# Patient Record
Sex: Female | Born: 1985 | Race: Black or African American | Hispanic: No | Marital: Single | Smoking: Never smoker
Health system: Southern US, Community
[De-identification: ages and names within clinical notes are randomized; demographics above are authoritative.]

## PROBLEM LIST (undated history)

## (undated) DIAGNOSIS — N281 Cyst of kidney, acquired: Secondary | ICD-10-CM

## (undated) DIAGNOSIS — Z807 Family history of other malignant neoplasms of lymphoid, hematopoietic and related tissues: Secondary | ICD-10-CM

## (undated) DIAGNOSIS — Z800 Family history of malignant neoplasm of digestive organs: Secondary | ICD-10-CM

## (undated) DIAGNOSIS — R7689 Other specified abnormal immunological findings in serum: Secondary | ICD-10-CM

## (undated) DIAGNOSIS — Z803 Family history of malignant neoplasm of breast: Secondary | ICD-10-CM

## (undated) DIAGNOSIS — R51 Headache: Secondary | ICD-10-CM

## (undated) DIAGNOSIS — Z8042 Family history of malignant neoplasm of prostate: Secondary | ICD-10-CM

## (undated) DIAGNOSIS — Q6689 Other  specified congenital deformities of feet: Secondary | ICD-10-CM

## (undated) DIAGNOSIS — K449 Diaphragmatic hernia without obstruction or gangrene: Secondary | ICD-10-CM

## (undated) DIAGNOSIS — L309 Dermatitis, unspecified: Secondary | ICD-10-CM

## (undated) DIAGNOSIS — R768 Other specified abnormal immunological findings in serum: Secondary | ICD-10-CM

## (undated) DIAGNOSIS — R7989 Other specified abnormal findings of blood chemistry: Secondary | ICD-10-CM

## (undated) DIAGNOSIS — J039 Acute tonsillitis, unspecified: Secondary | ICD-10-CM

## (undated) DIAGNOSIS — K2970 Gastritis, unspecified, without bleeding: Secondary | ICD-10-CM

## (undated) DIAGNOSIS — K760 Fatty (change of) liver, not elsewhere classified: Secondary | ICD-10-CM

## (undated) HISTORY — PX: CHOLECYSTECTOMY: SHX55

## (undated) HISTORY — DX: Other specified abnormal findings of blood chemistry: R79.89

## (undated) HISTORY — DX: Gastritis, unspecified, without bleeding: K29.70

## (undated) HISTORY — PX: TONSILLECTOMY: SUR1361

## (undated) HISTORY — DX: Cyst of kidney, acquired: N28.1

## (undated) HISTORY — DX: Family history of malignant neoplasm of breast: Z80.3

## (undated) HISTORY — DX: Other specified abnormal immunological findings in serum: R76.89

## (undated) HISTORY — DX: Other specified congenital deformities of feet: Q66.89

## (undated) HISTORY — DX: Dermatitis, unspecified: L30.9

## (undated) HISTORY — DX: Family history of malignant neoplasm of prostate: Z80.42

## (undated) HISTORY — DX: Family history of other malignant neoplasms of lymphoid, hematopoietic and related tissues: Z80.7

## (undated) HISTORY — DX: Fatty (change of) liver, not elsewhere classified: K76.0

## (undated) HISTORY — DX: Headache: R51

## (undated) HISTORY — DX: Other specified abnormal immunological findings in serum: R76.8

## (undated) HISTORY — DX: Family history of malignant neoplasm of digestive organs: Z80.0

## (undated) HISTORY — DX: Diaphragmatic hernia without obstruction or gangrene: K44.9

## (undated) HISTORY — DX: Acute tonsillitis, unspecified: J03.90

---
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
## 2006-02-12 ENCOUNTER — Emergency Department (HOSPITAL_COMMUNITY): Admission: EM | Admit: 2006-02-12 | Discharge: 2006-02-12 | Payer: Self-pay | Admitting: Family Medicine

## 2006-04-22 ENCOUNTER — Ambulatory Visit: Payer: Self-pay | Admitting: Family Medicine

## 2006-05-25 ENCOUNTER — Encounter: Payer: Self-pay | Admitting: Family Medicine

## 2006-05-25 ENCOUNTER — Ambulatory Visit: Payer: Self-pay | Admitting: Family Medicine

## 2006-05-25 LAB — CONVERTED CEMR LAB: Pap Smear: NORMAL

## 2006-05-27 ENCOUNTER — Other Ambulatory Visit: Admission: RE | Admit: 2006-05-27 | Discharge: 2006-05-27 | Payer: Self-pay | Admitting: Family Medicine

## 2006-06-23 ENCOUNTER — Ambulatory Visit: Payer: Self-pay | Admitting: Internal Medicine

## 2006-08-02 ENCOUNTER — Encounter: Payer: Self-pay | Admitting: Family Medicine

## 2006-08-02 ENCOUNTER — Ambulatory Visit: Payer: Self-pay | Admitting: Family Medicine

## 2006-08-02 DIAGNOSIS — L309 Dermatitis, unspecified: Secondary | ICD-10-CM | POA: Insufficient documentation

## 2006-08-02 DIAGNOSIS — R799 Abnormal finding of blood chemistry, unspecified: Secondary | ICD-10-CM | POA: Insufficient documentation

## 2006-08-02 DIAGNOSIS — B009 Herpesviral infection, unspecified: Secondary | ICD-10-CM | POA: Insufficient documentation

## 2006-08-02 DIAGNOSIS — D573 Sickle-cell trait: Secondary | ICD-10-CM | POA: Insufficient documentation

## 2006-08-02 DIAGNOSIS — R768 Other specified abnormal immunological findings in serum: Secondary | ICD-10-CM | POA: Insufficient documentation

## 2006-08-02 DIAGNOSIS — L259 Unspecified contact dermatitis, unspecified cause: Secondary | ICD-10-CM

## 2006-08-02 DIAGNOSIS — G43909 Migraine, unspecified, not intractable, without status migrainosus: Secondary | ICD-10-CM

## 2006-08-22 ENCOUNTER — Emergency Department (HOSPITAL_COMMUNITY): Admission: EM | Admit: 2006-08-22 | Discharge: 2006-08-22 | Payer: Self-pay | Admitting: Family Medicine

## 2006-12-06 ENCOUNTER — Telehealth (INDEPENDENT_AMBULATORY_CARE_PROVIDER_SITE_OTHER): Payer: Self-pay | Admitting: *Deleted

## 2006-12-21 ENCOUNTER — Telehealth: Payer: Self-pay | Admitting: Family Medicine

## 2006-12-26 ENCOUNTER — Ambulatory Visit: Payer: Self-pay | Admitting: Family Medicine

## 2007-01-27 ENCOUNTER — Telehealth (INDEPENDENT_AMBULATORY_CARE_PROVIDER_SITE_OTHER): Payer: Self-pay | Admitting: *Deleted

## 2007-02-17 ENCOUNTER — Telehealth: Payer: Self-pay | Admitting: Family Medicine

## 2007-04-22 ENCOUNTER — Emergency Department (HOSPITAL_COMMUNITY): Admission: EM | Admit: 2007-04-22 | Discharge: 2007-04-22 | Payer: Self-pay | Admitting: Emergency Medicine

## 2007-11-06 ENCOUNTER — Encounter (INDEPENDENT_AMBULATORY_CARE_PROVIDER_SITE_OTHER): Payer: Self-pay | Admitting: Internal Medicine

## 2007-11-06 ENCOUNTER — Ambulatory Visit: Payer: Self-pay | Admitting: Family Medicine

## 2007-11-06 ENCOUNTER — Other Ambulatory Visit: Admission: RE | Admit: 2007-11-06 | Discharge: 2007-11-06 | Payer: Self-pay | Admitting: Family Medicine

## 2007-11-06 LAB — CONVERTED CEMR LAB: Pap Smear: NORMAL

## 2007-11-09 ENCOUNTER — Encounter (INDEPENDENT_AMBULATORY_CARE_PROVIDER_SITE_OTHER): Payer: Self-pay | Admitting: Internal Medicine

## 2007-11-09 ENCOUNTER — Encounter (INDEPENDENT_AMBULATORY_CARE_PROVIDER_SITE_OTHER): Payer: Self-pay | Admitting: *Deleted

## 2007-11-14 ENCOUNTER — Ambulatory Visit: Payer: Self-pay | Admitting: Family Medicine

## 2007-11-15 LAB — CONVERTED CEMR LAB
ALT: 14 units/L (ref 0–35)
AST: 17 units/L (ref 0–37)
Albumin: 3.9 g/dL (ref 3.5–5.2)
Alkaline Phosphatase: 43 units/L (ref 39–117)
BUN: 12 mg/dL (ref 6–23)
Basophils Absolute: 0 10*3/uL (ref 0.0–0.1)
Basophils Relative: 0.6 % (ref 0.0–3.0)
Bilirubin, Direct: 0.1 mg/dL (ref 0.0–0.3)
CO2: 26 meq/L (ref 19–32)
Calcium: 9.5 mg/dL (ref 8.4–10.5)
Chloride: 108 meq/L (ref 96–112)
Cholesterol: 135 mg/dL (ref 0–200)
Creatinine, Ser: 0.8 mg/dL (ref 0.4–1.2)
Eosinophils Absolute: 0.1 10*3/uL (ref 0.0–0.7)
Eosinophils Relative: 1.7 % (ref 0.0–5.0)
GFR calc Af Amer: 116 mL/min
GFR calc non Af Amer: 96 mL/min
Glucose, Bld: 93 mg/dL (ref 70–99)
HCT: 34.3 % — ABNORMAL LOW (ref 36.0–46.0)
HDL: 38.7 mg/dL — ABNORMAL LOW (ref >39.0)
Hemoglobin: 12 g/dL (ref 12.0–15.0)
LDL Cholesterol: 92 mg/dL (ref 0–99)
Lymphocytes Relative: 32.4 % (ref 12.0–46.0)
MCHC: 35 g/dL (ref 30.0–36.0)
MCV: 92 fL (ref 78.0–100.0)
Monocytes Absolute: 0.5 10*3/uL (ref 0.1–1.0)
Monocytes Relative: 7 % (ref 3.0–12.0)
Neutro Abs: 4.1 10*3/uL (ref 1.4–7.7)
Neutrophils Relative %: 58.3 % (ref 43.0–77.0)
Platelets: 271 10*3/uL (ref 150–400)
Potassium: 3.6 meq/L (ref 3.5–5.1)
RBC: 3.72 M/uL — ABNORMAL LOW (ref 3.87–5.11)
RDW: 12.5 % (ref 11.5–14.6)
Sodium: 140 meq/L (ref 135–145)
TSH: 2.47 microintl units/mL (ref 0.35–5.50)
Total Bilirubin: 0.9 mg/dL (ref 0.3–1.2)
Total CHOL/HDL Ratio: 3.5
Total Protein: 6.9 g/dL (ref 6.0–8.3)
Triglycerides: 23 mg/dL (ref 0–149)
VLDL: 5 mg/dL (ref 0–40)
WBC: 7 10*3/uL (ref 4.5–10.5)

## 2007-11-25 ENCOUNTER — Emergency Department (HOSPITAL_COMMUNITY): Admission: EM | Admit: 2007-11-25 | Discharge: 2007-11-25 | Payer: Self-pay | Admitting: Emergency Medicine

## 2008-01-26 ENCOUNTER — Ambulatory Visit: Payer: Self-pay | Admitting: Family Medicine

## 2008-02-06 ENCOUNTER — Ambulatory Visit: Payer: Self-pay | Admitting: Family Medicine

## 2008-02-06 LAB — CONVERTED CEMR LAB: Mono Screen: NEGATIVE

## 2008-02-07 ENCOUNTER — Encounter: Payer: Self-pay | Admitting: Family Medicine

## 2008-02-07 ENCOUNTER — Telehealth: Payer: Self-pay | Admitting: Family Medicine

## 2008-02-08 ENCOUNTER — Telehealth: Payer: Self-pay | Admitting: Family Medicine

## 2008-02-12 ENCOUNTER — Telehealth: Payer: Self-pay | Admitting: Family Medicine

## 2008-02-13 ENCOUNTER — Encounter: Payer: Self-pay | Admitting: Family Medicine

## 2008-02-28 ENCOUNTER — Ambulatory Visit: Payer: Self-pay | Admitting: Family Medicine

## 2008-02-28 LAB — CONVERTED CEMR LAB: Rapid Strep: NEGATIVE

## 2008-03-05 ENCOUNTER — Telehealth: Payer: Self-pay | Admitting: Family Medicine

## 2008-03-07 ENCOUNTER — Encounter: Payer: Self-pay | Admitting: Family Medicine

## 2008-06-03 ENCOUNTER — Telehealth: Payer: Self-pay | Admitting: Family Medicine

## 2008-07-03 ENCOUNTER — Telehealth (INDEPENDENT_AMBULATORY_CARE_PROVIDER_SITE_OTHER): Payer: Self-pay | Admitting: *Deleted

## 2008-08-12 ENCOUNTER — Telehealth: Payer: Self-pay | Admitting: Family Medicine

## 2008-08-14 ENCOUNTER — Ambulatory Visit: Payer: Self-pay | Admitting: Family Medicine

## 2008-10-14 ENCOUNTER — Telehealth: Payer: Self-pay | Admitting: Family Medicine

## 2008-11-18 ENCOUNTER — Encounter: Payer: Self-pay | Admitting: Family Medicine

## 2008-11-18 ENCOUNTER — Ambulatory Visit: Payer: Self-pay | Admitting: Family Medicine

## 2008-11-18 ENCOUNTER — Other Ambulatory Visit: Admission: RE | Admit: 2008-11-18 | Discharge: 2008-11-18 | Payer: Self-pay | Admitting: Family Medicine

## 2008-11-18 LAB — HM PAP SMEAR

## 2008-11-19 LAB — CONVERTED CEMR LAB
ALT: 18 units/L (ref 0–35)
AST: 22 units/L (ref 0–37)
Albumin: 3.9 g/dL (ref 3.5–5.2)
Alkaline Phosphatase: 47 units/L (ref 39–117)
Basophils Relative: 0.1 % (ref 0.0–3.0)
Bilirubin, Direct: 0.1 mg/dL (ref 0.0–0.3)
CO2: 28 meq/L (ref 19–32)
Calcium: 9 mg/dL (ref 8.4–10.5)
Chloride: 107 meq/L (ref 96–112)
Creatinine, Ser: 0.7 mg/dL (ref 0.4–1.2)
Eosinophils Absolute: 0 10*3/uL (ref 0.0–0.7)
Eosinophils Relative: 0.6 % (ref 0.0–5.0)
Hemoglobin: 11.9 g/dL — ABNORMAL LOW (ref 12.0–15.0)
Lymphocytes Relative: 13.7 % (ref 12.0–46.0)
MCHC: 34.3 g/dL (ref 30.0–36.0)
Neutro Abs: 5.6 10*3/uL (ref 1.4–7.7)
Neutrophils Relative %: 80.4 % — ABNORMAL HIGH (ref 43.0–77.0)
RBC: 3.67 M/uL — ABNORMAL LOW (ref 3.87–5.11)
Sodium: 141 meq/L (ref 135–145)
Total Protein: 6.7 g/dL (ref 6.0–8.3)
WBC: 7 10*3/uL (ref 4.5–10.5)

## 2008-12-02 ENCOUNTER — Telehealth: Payer: Self-pay | Admitting: Family Medicine

## 2009-02-19 ENCOUNTER — Encounter: Payer: Self-pay | Admitting: Family Medicine

## 2009-02-20 ENCOUNTER — Ambulatory Visit: Payer: Self-pay | Admitting: Family Medicine

## 2009-02-20 DIAGNOSIS — N760 Acute vaginitis: Secondary | ICD-10-CM | POA: Insufficient documentation

## 2009-02-20 DIAGNOSIS — N63 Unspecified lump in unspecified breast: Secondary | ICD-10-CM

## 2009-02-24 ENCOUNTER — Encounter: Payer: Self-pay | Admitting: Family Medicine

## 2009-04-07 ENCOUNTER — Telehealth: Payer: Self-pay | Admitting: Family Medicine

## 2009-04-07 ENCOUNTER — Ambulatory Visit: Payer: Self-pay | Admitting: Family Medicine

## 2009-04-08 ENCOUNTER — Telehealth: Payer: Self-pay | Admitting: Family Medicine

## 2009-04-08 ENCOUNTER — Encounter (INDEPENDENT_AMBULATORY_CARE_PROVIDER_SITE_OTHER): Payer: Self-pay | Admitting: *Deleted

## 2009-04-09 ENCOUNTER — Ambulatory Visit: Payer: Self-pay | Admitting: Obstetrics & Gynecology

## 2009-04-09 LAB — CONVERTED CEMR LAB: Chlamydia, DNA Probe: NEGATIVE

## 2009-04-10 ENCOUNTER — Encounter: Payer: Self-pay | Admitting: Obstetrics & Gynecology

## 2009-04-10 ENCOUNTER — Encounter: Admission: RE | Admit: 2009-04-10 | Discharge: 2009-04-10 | Payer: Self-pay | Admitting: Family Medicine

## 2009-04-10 LAB — CONVERTED CEMR LAB: Trich, Wet Prep: NONE SEEN

## 2009-06-20 ENCOUNTER — Telehealth: Payer: Self-pay | Admitting: Family Medicine

## 2009-07-02 ENCOUNTER — Telehealth: Payer: Self-pay | Admitting: Family Medicine

## 2009-07-22 ENCOUNTER — Telehealth: Payer: Self-pay | Admitting: Family Medicine

## 2009-10-02 ENCOUNTER — Telehealth: Payer: Self-pay | Admitting: Family Medicine

## 2009-11-24 ENCOUNTER — Ambulatory Visit: Payer: Self-pay | Admitting: Family Medicine

## 2009-11-24 DIAGNOSIS — J019 Acute sinusitis, unspecified: Secondary | ICD-10-CM | POA: Insufficient documentation

## 2009-12-12 ENCOUNTER — Ambulatory Visit: Payer: Self-pay | Admitting: Internal Medicine

## 2009-12-12 DIAGNOSIS — N764 Abscess of vulva: Secondary | ICD-10-CM

## 2009-12-18 ENCOUNTER — Telehealth: Payer: Self-pay | Admitting: Family Medicine

## 2009-12-22 ENCOUNTER — Ambulatory Visit: Payer: Self-pay | Admitting: Internal Medicine

## 2009-12-22 ENCOUNTER — Encounter (INDEPENDENT_AMBULATORY_CARE_PROVIDER_SITE_OTHER): Payer: Self-pay | Admitting: *Deleted

## 2009-12-22 DIAGNOSIS — H01009 Unspecified blepharitis unspecified eye, unspecified eyelid: Secondary | ICD-10-CM | POA: Insufficient documentation

## 2010-01-06 ENCOUNTER — Telehealth: Payer: Self-pay | Admitting: Family Medicine

## 2010-03-20 ENCOUNTER — Telehealth: Payer: Self-pay | Admitting: Family Medicine

## 2010-04-08 ENCOUNTER — Encounter: Payer: Self-pay | Admitting: Family Medicine

## 2010-05-05 NOTE — Progress Notes (Signed)
Summary: LYRICA 50 MG CAPS  Phone Note Call from Patient Call back at Home Phone [phone]   Caller: Patient Call For: Judith Part MD Summary of Call: Patient says that she has been having flare ups with her fibromyalgia lately and wants to know if she can get a rx for LYRICA 50 MG CAPS [facility] [address].  Initial call taken by: Melody Comas,  June 20, 2009 3:47 PM  Follow-up for Phone Call        Medication phoned to pharmacy.  Follow-up by: Delilah Shan CMA Duncan Dull),  June 20, 2009 4:25 PM    New/Updated Medications: LYRICA 50 MG CAPS (PREGABALIN) 1 tab by mouth daily. Prescriptions: LYRICA 50 MG CAPS (PREGABALIN) 1 tab by mouth daily.  #30 x 0   Entered by:   Delilah Shan CMA (AAMA)   Authorized by:   Ruthe Mannan MD   Signed by:   Delilah Shan CMA (AAMA) on 06/20/2009   Method used:   Handwritten   RxID:   [id number]

## 2010-05-05 NOTE — Progress Notes (Signed)
Summary: Memetasone Furoate .01% cream rx  Phone Note Refill Request Call back at [phone] Message from:  [facility] on October 02, 2009 8:59 AM  Refills Requested: Medication #1:  ELOCON 0.1 % CREA apply to aff area for exzema once daily as needed - small amt [facility] electronically request refill for Memetasone Furoate 0.1% cream. No date sent for last refill.Please advise.    Method Requested: Telephone to Pharmacy Initial call taken by: Lewanda Rife LPN,  October 02, 2009 9:03 AM  Follow-up for Phone Call        px written on EMR for call in  Follow-up by: Judith Part MD,  October 02, 2009 12:54 PM    Prescriptions: ELOCON 0.1 % CREA (MOMETASONE FUROATE) apply to aff area for exzema once daily as needed - small amt  #1 medium x 1   Entered by:   Delilah Shan CMA (AAMA)   Authorized by:   Judith Part MD   Signed by:   Delilah Shan CMA Duncan Dull) on 10/02/2009   Method used:   Electronically to        [facility] [address] [phone]* (retail)       [address]       [place], [place]  [address]       Ph: [phone] or [phone]       Fax: [phone]   RxID:   [id number]

## 2010-05-05 NOTE — Progress Notes (Signed)
Summary: stopped bactrim  Phone Note Call from Patient Call back at [phone]   Caller: Patient Summary of Call: Pt took bactrim for 3-4 days for abscess, but stopped this last night because of persistent migraine, sweating.  She thought those might be side effects of the medicine.  She is asking if something else can be [facility].  Please let pt know- you can leave a message. Initial call taken by: Lowella Petties CMA,  December 18, 2009 10:26 AM  Follow-up for Phone Call        bactrim made her nauseated.  can switch to doxy.  one two times a day x 7 days.  please call patient. Follow-up by: Eustaquio Boyden  MD,  December 18, 2009 10:29 AM  Additional Follow-up for Phone Call Additional follow up Details #1::        Left message on voicemail advising patient of new meds and instructing her to call if she had any questions or problems. Additional Follow-up by: Janee Morn CMA Duncan Dull),  December 18, 2009 10:36 AM    New/Updated Medications: DOXYCYCLINE HYCLATE 100 MG CAPS (DOXYCYCLINE HYCLATE) one by mouth two times a day x 7 days Prescriptions: DOXYCYCLINE HYCLATE 100 MG CAPS (DOXYCYCLINE HYCLATE) one by mouth two times a day x 7 days  #14 x 0   Entered and Authorized by:   Eustaquio Boyden  MD   Signed by:   Eustaquio Boyden  MD on 12/18/2009   Method used:   [facility]        [facility] [address] [phone]* (retail)       [address]       [place], [place]  [address]       Ph: [phone] or [phone]       Fax: [phone]   RxID:   [id number]

## 2010-05-05 NOTE — Letter (Signed)
Summary: Out of Work  [employer] at [facility]  [address] [place], [place] [address]   Phone: [phone]  Fax: [phone]    April 08, 2009   Employee:  MEHA VIDRINE    To Whom It May Concern:   For Medical reasons, please excuse the above named employee from work for the following dates:  Start:   April 07 2009     End:   April 09 2009  If you need additional information, please feel free to contact our office.         Sincerely,    Kerby Nora MD

## 2010-05-05 NOTE — Progress Notes (Signed)
Summary: Doesn't feel good  Phone Note Call from Patient Call back at [phone]   Caller: Patient Call For: [facility] Summary of Call: pt states she could not get her meds last night because [employer] is saying she doesn't have insurance. Pt would also like a dr's note, she doesn't feel like going to work today. Please advise Initial call taken by: Mervin Hack CMA Duncan Dull),  April 08, 2009 10:07 AM  Follow-up for Phone Call        Can goven note for today..return on Wed.  She needs to call her insurance to figer out what is going on. She needs to start azithro at very least.  Follow-up by: Kerby Nora MD,  April 08, 2009 11:13 AM  Additional Follow-up for Phone Call Additional follow up Details #1::        PATIENT ADVISED.Consuello Masse CMA  Additional Follow-up by: Benny Lennert CMA Duncan Dull),  April 08, 2009 2:24 PM

## 2010-05-05 NOTE — Assessment & Plan Note (Signed)
Summary: ? ABSCESS LEFT LABIA   Vital Signs:  Patient profile:   25 year old female Weight:      150.25 pounds Temp:     98.4 degrees F oral Pulse rate:   76 / minute Pulse rhythm:   regular BP sitting:   116 / 60  (left arm) Cuff size:   regular  Vitals Entered By: Selena Batten Dance CMA (AAMA) (December 12, 2009 8:12 AM) CC: Bump on labia   History of Present Illness: CC: ? bump on labia  other day irritated using bathroom.  pt wit h/o sensitive skin.  Next day felt like had lump on left labia, now really irritated and sensitive, feels more on inside.  Hasn't tried anything so far.  No draining, no fevers or chills, no abd pain.  No dysuria.  Never had anything like this before.  ?boil.  Currently sexually active, one partner.  Has had chlamydia 2 years ago treated, has had BV.  Allergies: 1)  ! Sudafed  Physical Exam  General:  Well-developed,well-nourished,in no acute distress; alert,appropriate and cooperative throughout examination Rectal:  declines bottom check, says not really bothering her Genitalia:  external genital exam only - Normal introitus for age, no vaginal discharge, mucosa pink and moist, small bump (nodule) left labia majora at 2 oclock position <1cm in size, slight induration, no erythema/fluctuance   Impression & Recommendations:  Problem # 1:  OTHER ABSCESS OF VULVA (ICD-616.4) no break in skin, no fluctuance, nothing to lance today. early abscess/boil.  treat with bactrim ds 1 two times a day for next 10 days, warm compresses. discussed return if coming to a head will need lanced.  reassured.  Complete Medication List: 1)  Zovirax 5 % Oint (Acyclovir) .... Apply to affected area q 3-4 hours prn 2)  Zovirax 200 Mg Caps (Acyclovir) .Marland Kitchen.. 1 by mouth three times a day for 5 days as needed cold sore 3)  Elocon 0.1 % Crea (Mometasone furoate) .... Apply to aff area for exzema once daily as needed - small amt 4)  Lyrica 50 Mg Caps (Pregabalin) .Marland Kitchen.. 1 tab by mouth  daily. 5)  Bactrim Ds 800-160 Mg Tabs (Sulfamethoxazole-trimethoprim) .... One by mouth two times a day x 10 days, take with food  Patient Instructions: 1)  looks like the beginnings of a small boil.  treat for now with warm compresses 3 times a day, take antibiotic as prescribed, return next week for follow up if not improving. 2)  If coming to a head, you will need to return to lance it. 3)  Return sooner if fevers, chills, significant swelling or spreading redness, or other worsening. 4)  Good to meet you today, call clinic with questions. Prescriptions: BACTRIM DS 800-160 MG TABS (SULFAMETHOXAZOLE-TRIMETHOPRIM) one by mouth two times a day x 10 days, take with food  #20 x 0   Entered and Authorized by:   Eustaquio Boyden  MD   Signed by:   Eustaquio Boyden  MD on 12/12/2009   Method used:   [facility] to        [facility] [address] [phone]* (retail)       [address]       [place], [place]  [address]       Ph: [phone] or [phone]       Fax: [phone]   RxID:   [id number]   Current Allergies (reviewed today): ! SUDAFED  Appended Document: 3rd guardasil  given   Immunizations Administered:  HPV # 3:    Vaccine Type: Gardasil    Site: left deltoid    Mfr: Merck    Dose: 0.5 ml    Route: IM    Given by: Selena Batten Dance CMA (AAMA)    Exp. Date: 11/03/2011    Lot #: [id number]    VIS given: 08/05/09 version given December 12, 2009.

## 2010-05-05 NOTE — Progress Notes (Signed)
Summary: Productive cough with pain lt side of chest when cough  Phone Note Call from Patient Call back at [phone]   Caller: Patient Call For: Dr. Ermalene Searing Summary of Call: Cold for several days, now productive cough with green mucus, hurts on left side of chest when coughs, sorethroat, head congested and trouble breathing at times, ? fever and not sure if wheezing. Pt to see Dr. Ermalene Searing today at 4:30pm. Initial call taken by: Lewanda Rife LPN,  April 07, 2009 11:03 AM

## 2010-05-05 NOTE — Progress Notes (Signed)
Summary: wants something for anxiety  Phone Note Call from Patient Call back at [phone]   Caller: Patient Summary of Call: Pt is having anxiety issues at school and would like something for that-  she isnt sleeping, not able to focus.  She is concerned because she has a big test coming up on monday.  She wants to discuss this with Dr. Milinda Antis next week  but wants something to help her get through her test on monday.  Please advise[facility]. Initial call taken by: Lowella Petties CMA,  July 22, 2009 12:45 PM  Follow-up for Phone Call        i don't feel comfortable call in medicine like xanax for anxiety in for a patient I have never seen as a new prescription. If she needs something..she needs to make earlier appt with someone this week to discuss medicaitons and symptoms. Please work her in on someones schedule. Follow-up by: Kerby Nora MD,  July 22, 2009 1:02 PM  Additional Follow-up for Phone Call Additional follow up Details #1::        LMOM to call ...........Marland KitchenLowella Petties CMA  July 22, 2009 3:02 PM  Appt made.             Lowella Petties CMA  July 23, 2009 11:21 AM

## 2010-05-05 NOTE — Assessment & Plan Note (Signed)
Summary: COUGH,ST,HUTST LT SIDE OF CHEST WHEN COUGH AND TROUBLE BREATH...   Vital Signs:  Patient profile:   25 year old female Height:      65 inches Weight:      145.8 pounds BMI:     24.35 O2 Sat:      98 % on Room air Temp:     98.0 degrees F oral Pulse rate:   88 / minute Pulse rhythm:   regular BP sitting:   100 / 70  (left arm) Cuff size:   regular  Vitals Entered By: Benny Lennert CMA Duncan Dull) (April 07, 2009 4:38 PM)  O2 Flow:  Room air  History of Present Illness: Chief complaint cough,st,left side of chest hurts when coughing, and some trouble breathing  Acute Visit History:      The patient complains of cough, fever, headache, nasal discharge, sinus problems, and sore throat.  These symptoms began 5 days ago.  She denies earache.  Other comments include: In last month she has been having some cold, then recurs Taking theraflu, zicam, airborn, Nyquil, alka-seltzer, mucinex  also had "GI bug" last week, diarrhea, vomiting, fatigue improved.  cough spells..chest pain with cough. Works at nursing home.        Her highest temperature has been 100.3.  This temperature was recorded last week, none since.        The patient notes shortness of breath.  The character of the cough is described as productive.  There is no history of wheezing associated with her cough.        She complains of nasal congestion and purulent drainage.        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 Apply To Affected Area Q 3-4 Hours Prn 2)  Zovirax 200 Mg Caps (Acyclovir) .Marland Kitchen.. 1 By Mouth Three Times A Day For 5 Days As Needed Cold Sore 3)  Elocon 0.1 % Crea (Mometasone Furoate) .... Apply To Aff Area For Exzema Once Daily As Needed - Small Amt 4)  Azithromycin 250 Mg Tabs (Azithromycin) .... 2 Tab By Mouth X 1 Day, 1 Tab By Mouth Daily 5)  Benzonatate 200 Mg Caps (Benzonatate) .Marland Kitchen.. 1 Tab By Mouth Three Times A Day As Needed Cough  Allergies: 1)  ! Sudafed  Past History:  Risk Factors: Smoking Status: never (08/02/2006) Passive Smoke Exposure: no (11/06/2007)  Past medical, surgical, family and social histories (including risk factors) reviewed, and no changes noted (except as noted below).  Past Medical History: Reviewed history from 07/03/2008 and no changes required. Headache tonsillitis fibromyalgia eczema   Past Surgical History: Reviewed history from 08/14/2008 and no changes required. none   Family History: Reviewed history from 11/06/2007 and no changes required. MGM breast ca uncle MI--in his 48's  Social History: Reviewed history from 11/06/2007 and  no changes required. Marital Status: single Children: 0 Occupation: CNA at [employer]  Review of Systems General:  Complains of fatigue. CV:  Denies chest pain or discomfort. GI:  Denies abdominal pain. GU:  Denies dysuria.  Physical Exam  General:  Faituged appearing female inNAd Head:  no maxillary sinus ttp Ears:  clear fluid B TMS Nose:  clear nasal discharge Mouth:  MMM Neck:  no carotid bruit or thyromegaly no cervical or supraclavicular lymphadenopathy  Lungs:  Normal respiratory effort, chest expands symmetrically. Lungs are clear to auscultation, no crackles or wheezes. Heart:  Normal rate and regular rhythm. S1 and S2 normal  without gallop, murmur, click, rub or other extra sounds.   Impression & Recommendations:  Problem # 1:  BRONCHITIS- ACUTE (ICD-466.0) Given symptoms x 1 month with worsening in last 1 week..treat as viral infection with nbacterial superinfection..bronchitis. Start antibitoics, cough suppressant during day since biggest issue is cough at work. Call if not imrpoving as expected.  Her updated medication list for this problem includes:    Azithromycin 250 Mg Tabs (Azithromycin) .Marland Kitchen... 2 tab by mouth x 1 day, 1 tab by mouth daily    Benzonatate 200 Mg Caps (Benzonatate) .Marland Kitchen... 1 tab by mouth three times a day as needed cough  Complete Medication List: 1)  Zovirax 5 % Oint (Acyclovir) .... Apply to affected area q 3-4 hours prn 2)  Zovirax 200 Mg Caps (Acyclovir) .Marland Kitchen.. 1 by mouth three times a day for 5 days as needed cold sore 3)  Elocon 0.1 % Crea (Mometasone furoate) .... Apply to aff area for exzema once daily as needed - small amt 4)  Azithromycin 250 Mg Tabs (Azithromycin) .... 2 tab by mouth x 1 day, 1 tab by mouth daily 5)  Benzonatate 200 Mg Caps (Benzonatate) .Marland Kitchen.. 1 tab by mouth three times a day as needed cough  Patient Instructions: 1)  Start nasal saline and mucinex as needed. 2)  Vit C 500 mg daily. 3)  Start antibiotic, call if not improving in 48-72.  Prescriptions: ZOVIRAX 5 %  OINT (ACYCLOVIR) apply to affected area q 3-4 hours prn  #1 med tube x 2   Entered and Authorized by:   Kerby Nora MD   Signed by:   Kerby Nora MD on 04/07/2009   Method used:   [facility]        [facility] [address] [phone]* (retail)       [address]       [place], [place]  [address]       Ph: [phone] or [phone]       Fax: [phone]   RxID:   [id number] ZOVIRAX 200 MG CAPS (ACYCLOVIR) 1 by mouth three times a day for 5 days as needed cold sore  #15 x 3   Entered and Authorized by:   Kerby Nora MD   Signed by:   Kerby Nora MD on 04/07/2009    Method used:   [facility]ically to        [facility] [address] [phone]* (retail)       [address]       [place], [place]  [address]       Ph: [phone] or [phone]       Fax: [phone]   RxID:   [id number] BENZONATATE 200 MG CAPS (BENZONATATE) 1 tab by mouth three times a day  as needed cough  #21 x 0   Entered and Authorized by:   Kerby Nora MD   Signed by:   Kerby Nora MD on 04/07/2009   Method used:   Electronically to        [facility] [address] [phone]* (retail)       [address]       [place], [place]  [address]       Ph: [phone] or [phone]       Fax: [phone]   RxID:   [id number] AZITHROMYCIN 250 MG TABS (AZITHROMYCIN) 2 tab by mouth x 1 day, 1 tab by mouth daily  #6 x 0   Entered and Authorized by:   Kerby Nora MD   Signed by:   Kerby Nora MD on 04/07/2009   Method used:   Electronically to        [facility] [address] [phone]* (retail)       [address]       [place], [place]  [address]       Ph: [phone] or [phone]       Fax: [phone]   RxID:   [id number]   Current Allergies (reviewed today): ! SUDAFED

## 2010-05-05 NOTE — Letter (Signed)
Summary: Out of School  [place] at [facility]  [address] [place], [place] [address]   Phone: [phone]  Fax: [phone]    December 22, 2009   Student:  Fayrene Helper    To Whom It May Concern:   For Medical reasons, please excuse the above named student from school for the following dates:  Start:   December 22, 2009  End:    December 22, 2009   If you need additional information, please feel free to contact our office.   Sincerely,         Selena Batten Dance CMA (AAMA)    ****This is a legal document and cannot be tampered with.  Schools are authorized to verify all information and to do so accordingly.

## 2010-05-05 NOTE — Assessment & Plan Note (Signed)
Summary: ?URI/CLE   Vital Signs:  Patient profile:   25 year old female Height:      65 inches Weight:      145.75 pounds BMI:     24.34 Temp:     98.2 degrees F oral Pulse rate:   80 / minute Pulse rhythm:   regular BP sitting:   100 / 68  (left arm) Cuff size:   regular  Vitals Entered By: Lewanda Rife LPN (November 24, 2009 10:26 AM) CC: URI head congested and productive cough with green mucus   History of Present Illness: is having congestion thought it was just a cold- but is getting worse very dark green mucous  some facial pressure and pain now cough and sore throat  some production- green too no wheeze  no fever - just the first day with chills  has been sick about 10 days    Allergies: 1)  ! Sudafed  Past History:  Past Medical History: Last updated: 07/03/2008 Headache tonsillitis fibromyalgia eczema   Past Surgical History: Last updated: 08/14/2008 none   Family History: Last updated: 11/06/2007 MGM breast ca uncle MI--in his 69's  Social History: Last updated: 11/06/2007 Marital Status: single Children: 0 Occupation: CNA at [employer]  Risk Factors: Caffeine Use: 0 (11/06/2007) Exercise: no (11/06/2007)  Risk Factors: Smoking Status: never (08/02/2006) Passive Smoke Exposure: no (11/06/2007)  Review of Systems General:  Complains of fatigue and malaise; denies chills and fever. Eyes:  Denies blurring, discharge, and eye irritation. ENT:  Complains of nasal congestion, postnasal drainage, sinus pressure, and sore throat; denies ear discharge. CV:  Denies chest pain or discomfort and palpitations. Resp:  Complains of cough; denies wheezing. Derm:  Denies rash.  Physical Exam  General:  Well-developed,well-nourished,in no acute distress; alert,appropriate and cooperative throughout examination Head:  normocephalic, atraumatic, and no abnormalities observed.  bilat ethmoid sinus tenderness Eyes:  vision grossly intact, pupils  equal, pupils round, pupils reactive to light, and no injection.   Ears:  R ear normal and L ear normal.   Nose:  nares are injected and congested bilaterally  Mouth:  pharynx pink and moist.   Neck:  No deformities, masses, or tenderness noted. Lungs:  Normal respiratory effort, chest expands symmetrically. Lungs are clear to auscultation, no crackles or wheezes. Heart:  Normal rate and regular rhythm. S1 and S2 normal without gallop, murmur, click, rub or other extra sounds. Skin:  Intact without suspicious lesions or rashes Cervical Nodes:  No lymphadenopathy noted Psych:  normal affect, talkative and pleasant    Impression & Recommendations:  Problem # 1:  SINUSITIS - ACUTE-NOS (ICD-461.9) Assessment New with 10 days of worsening congestion and some facial pain tx with augmentin and update recommend sympt care- see pt instructions   pt advised to update me if symptoms worsen or do not improve  The following medications were removed from the medication list:    Azithromycin 250 Mg Tabs (Azithromycin) .Marland Kitchen... 2 tab by mouth x 1 day, 1 tab by mouth daily Her updated medication list for this problem includes:    Benzonatate 200 Mg Caps (Benzonatate) .Marland Kitchen... 1 tab by mouth three times a day as needed cough    Mucinex D 60-600 Mg Xr12h-tab (Pseudoephedrine-guaifenesin) ..... Otc as directed.    Tylenol Cold Severe Congestion 30-15-200-325 Mg Tabs (Pseudoephedrine-dm-gg-apap) ..... Otc as directed.    Augmentin 875-125 Mg Tabs (Amoxicillin-pot clavulanate) .Marland Kitchen... 1 by mouth two times a day for 10 days for sinus infection  Problem # 2:  Preventive Health Care (ICD-V70.0) Assessment: Comment Only needs 3rd hpv vaccine - will get back for that when the sinusitis is resolved  Complete Medication List: 1)  Zovirax 5 % Oint (Acyclovir) .... Apply to affected area q 3-4 hours prn 2)  Zovirax 200 Mg Caps (Acyclovir) .Marland Kitchen.. 1 by mouth three times a day for 5 days as needed cold sore 3)  Elocon 0.1 %  Crea (Mometasone furoate) .... Apply to aff area for exzema once daily as needed - small amt 4)  Benzonatate 200 Mg Caps (Benzonatate) .Marland Kitchen.. 1 tab by mouth three times a day as needed cough 5)  Lyrica 50 Mg Caps (Pregabalin) .Marland Kitchen.. 1 tab by mouth daily. 6)  Mucinex D 60-600 Mg Xr12h-tab (Pseudoephedrine-guaifenesin) .... Otc as directed. 7)  Tylenol Cold Severe Congestion 30-15-200-325 Mg Tabs (Pseudoephedrine-dm-gg-apap) .... Otc as directed. 8)  Augmentin 875-125 Mg Tabs (Amoxicillin-pot clavulanate) .Marland Kitchen.. 1 by mouth two times a day for 10 days for sinus infection  Patient Instructions: 1)  you can try mucinex over the counter twice daily as directed and nasal saline spray for congestion 2)  tylenol over the counter as directed may help with aches, headache and fever 3)  call if symptoms worsen or if not improved in 4-5 days  4)  take the augmentin as directed  Prescriptions: AUGMENTIN 875-125 MG TABS (AMOXICILLIN-POT CLAVULANATE) 1 by mouth two times a day for 10 days for sinus infection  #20 x 0   Entered and Authorized by:   Judith Part MD   Signed by:   Judith Part MD on 11/24/2009   Method used:   [facility] to        [facility] [address] [phone]* (retail)       [address]       [place], [place]  [address]       Ph: [phone] or [phone]       Fax: [phone]   RxID:   [id number]   Current Allergies (reviewed today): ! SUDAFED

## 2010-05-05 NOTE — Assessment & Plan Note (Signed)
Summary: RASH UNDER EYE   Vital Signs:  Patient profile:   25 year old female Weight:      148.25 pounds Temp:     98.7 degrees F oral Pulse rate:   64 / minute Pulse rhythm:   regular BP sitting:   128 / 80  (left arm) Cuff size:   regular  Vitals Entered By: Selena Batten Dance CMA Duncan Dull) (December 22, 2009 3:55 PM) CC: Rash under eye   History of Present Illness: CC: R eye swelling  Yesterday face itching and bumps under right eye.  Today eye really swollen, itching.  White of eye never red.  Treated with warm compresses.  Swelling improved now, but still irritated.  Also irritating to wash face (uses dove).  Never had similar event to eye.  Some blurred vision.  + HA.    No fevers/chills.  No abd pain/n/v/d.  Allergies: 1)  ! Sudafed  Past History:  Past Medical History: Last updated: 07/03/2008 Headache tonsillitis fibromyalgia eczema   Social History: Last updated: 11/06/2007 Marital Status: single Children: 0 Occupation: CNA at [employer]  Review of Systems       per HPI  Physical Exam  General:  Well-developed,well-nourished,in no acute distress; alert,appropriate and cooperative throughout examination Head:  normocephalic, atraumatic, and no abnormalities observed.  Eyes:  vision grossly intact, pupils equal, pupils round, pupils reactive to light, and no injection.  + R lower eyelid slightly more swollen, erythematous on inside than L eyelid.  meibomian glands slightly enlarged compared to L side, along with small amt crusted discharge below R eye.  optic discs visualized and normal. Ears:  R ear normal and L ear normal.   Nose:  clear Mouth:  pharynx pink and moist.   Neck:  + small AC LAD on R   Impression & Recommendations:  Problem # 1:  BLEPHARITIS, RIGHT (ICD-373.00) treat with erythromycin ointment and warm compresses followed by eyelid massage. red flags to return discussed including swelling of eye, fevers, spreading redness or vision  changes.  Complete Medication List: 1)  Zovirax 5 % Oint (Acyclovir) .... Apply to affected area q 3-4 hours prn 2)  Zovirax 200 Mg Caps (Acyclovir) .Marland Kitchen.. 1 by mouth three times a day for 5 days as needed cold sore 3)  Elocon 0.1 % Crea (Mometasone furoate) .... Apply to aff area for exzema once daily as needed - small amt 4)  Lyrica 50 Mg Caps (Pregabalin) .Marland Kitchen.. 1 tab by mouth daily. 5)  Erythromycin 5 Mg/gm Oint (Erythromycin) .... Instill 1/2 " ribbon to right lower eyelid three times a day x 5 days  Patient Instructions: 1)  Looks like an eyelid infection.  Treat with antibiotics and warm compresses and keeping area clean.  Also consider eyelid massage after warm compresses - clean finger to massage lower eyelid towards nasal border. 2)  Pleasure to see you, call clinic with quesitons. Prescriptions: ERYTHROMYCIN 5 MG/GM OINT (ERYTHROMYCIN) instill 1/2 " ribbon to right lower eyelid three times a day x 5 days  #1 x 0   Entered and Authorized by:   Eustaquio Boyden  MD   Signed by:   Eustaquio Boyden  MD on 12/22/2009   Method used:   [facility]        [facility] [address] [phone]* (retail)       [address]       [place], [place]  [address]  Ph: [phone] or [phone]       Fax: [phone]   RxID:   [id number]   Current Allergies (reviewed today): ! SUDAFED

## 2010-05-05 NOTE — Progress Notes (Signed)
Summary: zovirax  Phone Note Refill Request Message from:  Fax from Pharmacy on January 06, 2010 9:19 AM  Refills Requested: Medication #1:  ZOVIRAX 200 MG CAPS 1 by mouth three times a day for 5 days as needed cold sore   Last Refilled: 12/22/2009 [facility] [address] [phone].  Initial call taken by: Melody Comas,  January 06, 2010 9:20 AM  Follow-up for Phone Call        px written on EMR for call in  Follow-up by: Judith Part MD,  January 06, 2010 11:27 AM  Additional Follow-up for Phone Call Additional follow up Details #1::        [facility] as instructed. Lewanda Rife LPN  January 06, 2010 11:41 AM     New/Updated Medications: ZOVIRAX 200 MG CAPS (ACYCLOVIR) 1 by mouth three times a day for 5 days as needed cold sore Prescriptions: ZOVIRAX 200 MG CAPS (ACYCLOVIR) 1 by mouth three times a day for 5 days as needed cold sore  #15 x 3   Entered and Authorized by:   Judith Part MD   Signed by:   Lewanda Rife LPN on 45/40/9811   Method used:   Telephoned to ...       [facility] [address] [phone]* (retail)       [address]       [place], [place]  [address]       Ph: [phone] or [phone]       Fax: [phone]   RxID:   [id number]

## 2010-05-05 NOTE — Progress Notes (Signed)
Summary: cold symptoms  Phone Note Call from Patient Call back at [phone]   Caller: Patient Call For: Judith Part MD Summary of Call: Patient called complaining of cough, sneezing, congestion. She has not had a fever, no body aches, no sore throat. She says that she feels completely fine other than the cold like symptoms. 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 She wants to know if you could recommend her something to take over the counter. Patient says that she will be in class the rest of the day and it is okay to leave message.  Please advise.  Initial call taken by: Melody Comas,  July 02, 2009 11:36 AM  Follow-up for Phone Call        zyrtec 10 mg for runny nose and drip plain mucinex for congestion rest and fluids f/u if worse or not improved  Follow-up by: Judith Part MD,  July 02, 2009 1:51 PM  Additional Follow-up for Phone Call Additional follow up Details #1::        Patient notified as instructed by telephone v/m. Lewanda Rife LPN  July 02, 2009 4:07 PM

## 2010-05-07 NOTE — Consult Note (Signed)
Summary: [facility]   Imported By: Maryln Gottron 04/24/2010 15:37:52  _____________________________________________________________________  External Attachment:    Type:   Image     Comment:   External Document

## 2010-05-07 NOTE — Progress Notes (Signed)
Summary: regarding breast cyst  Phone Note Call from Patient Call back at [phone]   Caller: Patient Summary of Call: Pt states she has a cyst in right breast, dx'd last year.  She says this gets larger and painful with her periods.  Advised that is normal but she is concerned.  Please advise. Initial call taken by: Lowella Petties CMA, AAMA,  March 20, 2010 9:07 AM  Follow-up for Phone Call        if it is really bothering her - I advise referral to a surgeon to eval  she has [place] (? last jan ) to send also  will do ref for [facility] Follow-up by: Judith Part MD,  March 20, 2010 10:03 AM  Additional Follow-up for Phone Call Additional follow up Details #1::        Appt made with Dr Bertram Savin on 04/08/2010 at 3pm, patient called and notified. Records faxed to Dr Freida Busman. Additional Follow-up by: Carlton Adam,  March 20, 2010 10:59 AM

## 2010-06-07 ENCOUNTER — Emergency Department (HOSPITAL_COMMUNITY)
Admission: EM | Admit: 2010-06-07 | Discharge: 2010-06-08 | Disposition: A | Discharge status: Home / Self Care | Payer: Federal, State, Local not specified - PPO | Attending: Emergency Medicine | Admitting: Emergency Medicine

## 2010-06-07 DIAGNOSIS — R197 Diarrhea, unspecified: Secondary | ICD-10-CM | POA: Insufficient documentation

## 2010-06-07 DIAGNOSIS — K5289 Other specified noninfective gastroenteritis and colitis: Secondary | ICD-10-CM | POA: Insufficient documentation

## 2010-06-07 DIAGNOSIS — R112 Nausea with vomiting, unspecified: Secondary | ICD-10-CM | POA: Insufficient documentation

## 2010-06-07 DIAGNOSIS — R000 Tachycardia, unspecified: Secondary | ICD-10-CM | POA: Insufficient documentation

## 2010-06-07 LAB — URINALYSIS, ROUTINE W REFLEX MICROSCOPIC
Glucose, UA: NEGATIVE mg/dL
Nitrite: NEGATIVE
Protein, ur: NEGATIVE mg/dL
Urobilinogen, UA: 0.2 mg/dL (ref 0.0–1.0)

## 2010-06-07 LAB — CBC
HCT: 34.8 % — ABNORMAL LOW (ref 36.0–46.0)
Hemoglobin: 12 g/dL (ref 12.0–15.0)
WBC: 9.6 10*3/uL (ref 4.0–10.5)

## 2010-06-07 LAB — DIFFERENTIAL
Basophils Absolute: 0 10*3/uL (ref 0.0–0.1)
Basophils Relative: 0 % (ref 0–1)
Lymphs Abs: 1 10*3/uL (ref 0.7–4.0)

## 2010-06-07 LAB — POCT PREGNANCY, URINE: Preg Test, Ur: NEGATIVE

## 2010-06-07 LAB — COMPREHENSIVE METABOLIC PANEL
ALT: 19 U/L (ref 0–35)
AST: 26 U/L (ref 0–37)
Alkaline Phosphatase: 53 U/L (ref 39–117)
CO2: 19 mEq/L (ref 19–32)
GFR calc Af Amer: >60 mL/min (ref >60)
Glucose, Bld: 146 mg/dL — ABNORMAL HIGH (ref 70–99)
Potassium: 3 mEq/L — ABNORMAL LOW (ref 3.5–5.1)
Sodium: 136 mEq/L (ref 135–145)
Total Protein: 7.8 g/dL (ref 6.0–8.3)

## 2010-06-07 LAB — URINE MICROSCOPIC-ADD ON

## 2010-06-19 ENCOUNTER — Telehealth: Payer: Self-pay | Admitting: Family Medicine

## 2010-06-23 NOTE — Progress Notes (Signed)
Summary: throat feels tight   Phone Note Call from Patient Call back at Home Phone [phone]   Caller: Patient Call For: Judith Part MD Summary of Call: Patient had her tonsils removed around two years ago. She says that last night she felt as if her throat was closing up. She was not  having any sob or any other symptoms. Today she still feels like her throat is tight when she swallows and is just a little sore, but she says that it's not like a typical sore throat. She is asking if she should be alarmed by this. I advised her she should probably go to an Urgent Care ( we have no appt here today), but she wanted me to ask a Dr. first because she doens't want to waste time or money. Please advise.  Initial call taken by: Melody Comas,  June 19, 2010 1:37 PM  Follow-up for Phone Call        likely sore throat from viral infection or strep vs other.  could start with ibuprofen, salt water gargles.  if bothering her, probably safest thing is to have it checked out.   Follow-up by: Eustaquio Boyden  MD,  June 19, 2010 1:42 PM  Additional Follow-up for Phone Call Additional follow up Details #1::        Patient notified. Will try the above, if not improving will call for appt. Also told her that if she start having any sob, to go tto ER.  Additional Follow-up by: Melody Comas,  June 19, 2010 1:53 PM

## 2010-07-14 ENCOUNTER — Encounter: Payer: Self-pay | Admitting: Family Medicine

## 2010-07-16 ENCOUNTER — Ambulatory Visit (INDEPENDENT_AMBULATORY_CARE_PROVIDER_SITE_OTHER): Payer: Federal, State, Local not specified - PPO | Admitting: Family Medicine

## 2010-07-16 ENCOUNTER — Encounter: Payer: Self-pay | Admitting: Family Medicine

## 2010-07-16 DIAGNOSIS — H539 Unspecified visual disturbance: Secondary | ICD-10-CM

## 2010-07-16 DIAGNOSIS — H9190 Unspecified hearing loss, unspecified ear: Secondary | ICD-10-CM

## 2010-07-16 NOTE — Assessment & Plan Note (Addendum)
Normal screen. Normal exam.  Call back if progressive.  She declined audiology referral today.

## 2010-07-16 NOTE — Patient Instructions (Signed)
I would call the eye clinic about reading glasses.  Let us know if you have more concerns about your hearing.  Take care.

## 2010-07-16 NOTE — Progress Notes (Signed)
Trouble hearing.  "Going on for a while."  She may miss out on certain words.  Going on for years.  Progressive per patient.  She thinks it is bilateral.  Thinks L side is worse than R.  No sig noise exposure.  No FH of hearing trouble.  No FCNAV.  No ear pain.  Occ faint ringing in ears.   Having to squint more, working on the computer.  Sits at the front of the room.    Meds, vitals, and allergies reviewed.   ROS: See HPI.  Otherwise, noncontributory.  GEN: nad, alert and oriented HEENT: mucous membranes moist, tm wnl, nasal and oral exam wnl, Air>bone conduction bilaterally Fundus wnl, eomi, perrla NECK: supple w/o LA CV: rrr PULM: ctab, no inc wob CN 2-12 wnl B

## 2010-07-16 NOTE — Assessment & Plan Note (Addendum)
Normal exam and screen.  She'll call about going to eye clinic.  She agrees.  She may have eye strain on the computer.

## 2010-07-17 ENCOUNTER — Other Ambulatory Visit: Payer: Self-pay | Admitting: *Deleted

## 2010-07-17 ENCOUNTER — Encounter: Payer: Self-pay | Admitting: Family Medicine

## 2010-07-17 MED ORDER — ACYCLOVIR 5 % EX OINT: TOPICAL_OINTMENT | CUTANEOUS | Status: DC

## 2010-07-17 NOTE — Telephone Encounter (Signed)
plz notify sent in. 

## 2010-07-20 NOTE — Telephone Encounter (Signed)
Patient notified
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
## 2010-09-07 ENCOUNTER — Ambulatory Visit (INDEPENDENT_AMBULATORY_CARE_PROVIDER_SITE_OTHER): Payer: Federal, State, Local not specified - PPO | Admitting: Family Medicine

## 2010-09-07 ENCOUNTER — Encounter: Payer: Self-pay | Admitting: Family Medicine

## 2010-09-07 DIAGNOSIS — Z91013 Allergy to seafood: Secondary | ICD-10-CM | POA: Insufficient documentation

## 2010-09-07 DIAGNOSIS — H5710 Ocular pain, unspecified eye: Secondary | ICD-10-CM

## 2010-09-07 DIAGNOSIS — L209 Atopic dermatitis, unspecified: Secondary | ICD-10-CM | POA: Insufficient documentation

## 2010-09-07 DIAGNOSIS — L2089 Other atopic dermatitis: Secondary | ICD-10-CM

## 2010-09-07 MED ORDER — ACYCLOVIR 5 % EX CREA: 1.0000 "application " | TOPICAL_CREAM | Freq: Every day | CUTANEOUS | Status: DC

## 2010-09-07 MED ORDER — PIMECROLIMUS 1 % EX CREA: TOPICAL_CREAM | Freq: Two times a day (BID) | CUTANEOUS | Status: AC

## 2010-09-07 NOTE — Progress Notes (Signed)
  Subjective:    Patient ID: Kim Walsh, female    DOB: [birth year], 25 y.o.   MRN: [id number]  HPI CC: eye pain   3 wk h/o eye pain R>L.  Under eyelids burning, itching.  Seems to rash, then gets scaly, when eyes water feels burning.    Notes worse when rubs eyes.  No vision changes.  No pain with eye movement.  No pressure sensation behind eye.  Starting to scab some.  H/o eczema on creases of arms/popliteal knees.  has tried neosporin, hydrocortisone cream, cucumbers (seem to help the best), elicon.  12/2009 seen in office, dx with blepharitis and tx with erythromycin oint.  Also requests allergy referral as has noticed when eats shrimp lips blister and swell.  Wants to make sure not allergic to other things.  No fevers/chills, eye discharge, nausea/vomiting, throat swelling.  Review of Systems Per HPI    Objective:   Physical Exam  Nursing note and vitals reviewed. Constitutional: She appears well-developed and well-nourished. No distress.  HENT:  Head: Normocephalic and atraumatic.  Nose: Nose normal.  Mouth/Throat: Oropharynx is clear and moist. No oropharyngeal exudate.  Eyes: EOM are normal. Pupils are equal, round, and reactive to light. Right eye exhibits no discharge. Left eye exhibits no discharge. Right conjunctiva is injected. Left conjunctiva is not injected. No scleral icterus. Right eye exhibits no nystagmus. Left eye exhibits no nystagmus.         Bulbar and palpebral conjunctiva not injected. No pain with eye movements.  Neck: Normal range of motion. Neck supple. No thyromegaly present.  Lymphadenopathy:    She has no cervical adenopathy.  Skin: Skin is warm and dry. No rash noted.          Assessment & Plan:

## 2010-09-07 NOTE — Assessment & Plan Note (Signed)
Referral to allergist for eval.

## 2010-09-07 NOTE — Assessment & Plan Note (Signed)
Consistent with eyelid atopic dermatitis.  Treat with elidel cream.  Update if red flags to consider ophtho eval.

## 2010-09-07 NOTE — Patient Instructions (Addendum)
I think you have eyelid eczema.  Start elidel cream under eyelid twice daily for 4 wks. May also use cool compress for symptomatic relief. Pass by Marion's office for referral to allergist to eval shrimp allergy. Use fragrance free lotions.  Use dove soap.  May try eucerin or aveeno.  Update [place] if not improving as expected or any worsening.

## 2010-10-13 ENCOUNTER — Encounter: Payer: Self-pay | Admitting: Family Medicine

## 2010-10-13 ENCOUNTER — Telehealth: Payer: Self-pay

## 2010-10-13 NOTE — Telephone Encounter (Signed)
That is fine - I will do letter  Ask her to remind me if we have FMLA forms for her job - ? If have done them for her before ... This would certify that she has a chronic condition she sometimes must leave work for  If not- ask her employer to give the forms to her so I can fill them out

## 2010-10-13 NOTE — Telephone Encounter (Signed)
Patient notified as instructed by telephone. Pt said she has never filled out FMLA papers but she will ck with her employer. Letter left at front desk for pickup.

## 2010-10-13 NOTE — Telephone Encounter (Signed)
Kim Walsh took call from pt that she had to be out of work on Sunday 10/11/10 for fibromyalgia. Pt needs note for work excusing her on 10/11/10.Please advise. Pt can be reached at [phone].

## 2010-12-09 ENCOUNTER — Other Ambulatory Visit: Payer: Self-pay | Admitting: *Deleted

## 2010-12-09 MED ORDER — ACYCLOVIR 200 MG PO CAPS: 200.0000 mg | ORAL_CAPSULE | Freq: Three times a day (TID) | ORAL | Status: DC

## 2010-12-09 NOTE — Telephone Encounter (Signed)
Will refill electronically  

## 2011-04-12 ENCOUNTER — Telehealth: Payer: Self-pay | Admitting: *Deleted

## 2011-04-12 NOTE — Telephone Encounter (Signed)
Head and chest congestion. Productive cough with clear phlegm; but on and off when coughs she cannot stop coughing. Pt has SOB upon exertion. No wheezing. Pt has not taken temp but is having chills. [facility] [address] [facility] and can be reached [phone].

## 2011-04-12 NOTE — Telephone Encounter (Signed)
I recommend mucinex DM for cough and tylenol for fever/ chills (unless she cannot take either of these) Fluids and rest  F/u if no further improvement later this week or if worse

## 2011-04-12 NOTE — Telephone Encounter (Signed)
Patient notified as instructed by telephone. 

## 2011-04-12 NOTE — Telephone Encounter (Signed)
If she has been sick since thursday - tamiflu won't help-- it has to be started in the first 48  Hours  Let me know what symptoms she is having and then I may be able to recommend some sympt care

## 2011-04-12 NOTE — Telephone Encounter (Signed)
Pt c/o flu has been feeling bad since Thurs, wants to know if tamiflu can be called in for her (no appts avail today)

## 2011-04-15 ENCOUNTER — Telehealth: Payer: Self-pay | Admitting: Internal Medicine

## 2011-04-15 ENCOUNTER — Encounter: Payer: Self-pay | Admitting: Family Medicine

## 2011-04-15 ENCOUNTER — Ambulatory Visit (INDEPENDENT_AMBULATORY_CARE_PROVIDER_SITE_OTHER): Payer: Federal, State, Local not specified - PPO | Admitting: Family Medicine

## 2011-04-15 DIAGNOSIS — IMO0002 Reserved for concepts with insufficient information to code with codable children: Secondary | ICD-10-CM | POA: Insufficient documentation

## 2011-04-15 DIAGNOSIS — J40 Bronchitis, not specified as acute or chronic: Secondary | ICD-10-CM | POA: Insufficient documentation

## 2011-04-15 DIAGNOSIS — J069 Acute upper respiratory infection, unspecified: Secondary | ICD-10-CM

## 2011-04-15 DIAGNOSIS — T1490XA Injury, unspecified, initial encounter: Secondary | ICD-10-CM

## 2011-04-15 MED ORDER — FLUCONAZOLE 150 MG PO TABS: 150.0000 mg | ORAL_TABLET | Freq: Once | ORAL | Status: AC

## 2011-04-15 NOTE — Telephone Encounter (Signed)
Made an appointment with Dr. Para March for patient due to vaginal issues.

## 2011-04-15 NOTE — Progress Notes (Signed)
"  I have a condom stuck in me (vaginal)".  Intercourse last night.  No FCNAVD.  No pain, no dysuria.  Safe at home.  Doesn't have an IUD.  No h/o pelvic surgery.    Mild recent uri sx.  No fevers.  Is stuffy with some rhinorrhea.  Minimal cough.    Meds, vitals, and allergies reviewed.   ROS: See HPI.  Otherwise, noncontributory.  GEN: nad, alert and oriented HEENT: mucous membranes moist, tm w/o erythema, nasal exam w/o erythema, clear discharge noted,  OP with cobblestoning NECK: supple w/o LA CV: rrr.   PULM: ctab, no inc wob EXT: no edema SKIN: no acute rash Normal introitus for age, no external lesions, small amount of white vaginal discharge, mucosa pink and moist, no vaginal or cervical lesions, no vaginal atrophy, no friaility or hemorrhage, white condom easily removed w/o complication from vaginal vault.  Chaperoned exam.

## 2011-04-15 NOTE — Patient Instructions (Signed)
Use the diflucan today and then repeat in 1 week if needed.  Drink plenty of fluids, take tylenol as needed, and gargle with warm salt water for your throat.  This should gradually improve.  Take care.  Let us know if you have other concerns.

## 2011-04-18 NOTE — Assessment & Plan Note (Signed)
Likely viral, supportive tx and f/u prn.  Nontoxic.

## 2011-04-18 NOTE — Assessment & Plan Note (Signed)
Removed, tolerated well, given the discharge use diflucan and f/u prn.

## 2011-05-06 ENCOUNTER — Ambulatory Visit: Payer: Self-pay | Admitting: Family Medicine

## 2011-05-06 ENCOUNTER — Ambulatory Visit: Payer: Federal, State, Local not specified - PPO | Admitting: Family Medicine

## 2011-05-06 ENCOUNTER — Telehealth: Payer: Self-pay | Admitting: Family Medicine

## 2011-05-06 NOTE — Telephone Encounter (Signed)
Has appt scheduled.

## 2011-05-06 NOTE — Telephone Encounter (Signed)
Triage Record Num: [id number] Operator: Albertine Grates Patient Name: Kim Walsh Call Date & Time: 05/05/2011 5:07:44PM Patient Phone: [phone] PCP: Audrie Gallus. Tower Patient Gender: Female PCP Fax : Patient DOB: 1985-06-09 Practice Name: [facility] Reason for Call: Caller: [name]/Patient; PCP: Roxy Manns A.; CB#: [phone]; ; ; Call regarding Cough/Congestion; Has cough/congestion/sore throat since 1-28. Had vomiting/diarrhea initially but after that resolved, developed cough/cold. Temp 101 1-30 but afebrile 1-30. Sore throat most concerning. Has swollen lymph nodes and are tender. Has been gargling warm salt water. appointment scheduled for 1-31 at 1515 Protocol(s) Used: Sore Throat or Hoarseness Recommended Outcome per Protocol: See Provider within 24 hours Reason for Outcome: New onset of painful, swollen glands on sides of neck or under jaw Care Advice: Sore Throat Relief: - Use warm salt water gargles 3 to 4 times/day, as needed (1/2 tsp. salt in 8 oz. [.2 liters] water). - Suck on hard candy, nonprescription or herbal throat lozenges (sugar-free if diabetic) - Eat soothing, soft food/fluids (broths, soups, or honey and lemon juice in hot tea, Popsicles, frozen yogurt or sherbet, scrambled eggs, cooked cereals, Jell-O or puddings) whichever is most comforting. - Avoid eating salty, spicy or acidic foods. ~ 05/05/2011 5:25:03PM Page 1 of 1 [email or web address]

## 2011-06-09 ENCOUNTER — Ambulatory Visit (INDEPENDENT_AMBULATORY_CARE_PROVIDER_SITE_OTHER): Payer: Federal, State, Local not specified - PPO | Admitting: Family Medicine

## 2011-06-09 ENCOUNTER — Encounter: Payer: Self-pay | Admitting: Family Medicine

## 2011-06-09 VITALS — BP 100/70 | HR 82 | Temp 98.1°F | Wt 140.8 lb

## 2011-06-09 DIAGNOSIS — J40 Bronchitis, not specified as acute or chronic: Secondary | ICD-10-CM

## 2011-06-09 MED ORDER — GUAIFENESIN-CODEINE 100-10 MG/5ML PO SYRP: 5.0000 mL | ORAL_SOLUTION | Freq: Every evening | ORAL | Status: AC | PRN

## 2011-06-09 MED ORDER — AZITHROMYCIN 250 MG PO TABS: ORAL_TABLET | ORAL | Status: AC

## 2011-06-09 NOTE — Progress Notes (Signed)
  Subjective:    Patient ID: Kim Walsh, female    DOB: 15-Aug-1985, 26 y.o.   MRN: [id number]  HPI CC: cold?  Cold sxs for last 1+ mo.  At first thought had flu, then just viral URTI.  Never fully better, lingering.  Mild cough productive of yellow/white sputum.  Continuous PNDrainage.  Continued nasal congestion.  Mild ST.  Feels constant drainage - sinus congestion.  No fevers/chills, abd pain, n/v, RN, ear pain or tooth pain.  No HA.    Has been seen twice for this, once here 04/15/2011.  Has taken lots of mucinex.  Also tried emergency, airborne and tylenol.  + sick contacts at home and work.  Kept god daughter recently - got worse after this for last week.  No smokers at home.  No h/o asthma.  Review of Systems Per HPI    Objective:   Physical Exam  Nursing note and vitals reviewed. Constitutional: She appears well-developed and well-nourished. No distress.  HENT:  Head: Normocephalic and atraumatic.  Right Ear: Hearing, tympanic membrane, external ear and ear canal normal.  Left Ear: Hearing, tympanic membrane, external ear and ear canal normal.  Nose: Mucosal edema present. No rhinorrhea. Right sinus exhibits no maxillary sinus tenderness and no frontal sinus tenderness. Left sinus exhibits no maxillary sinus tenderness and no frontal sinus tenderness.  Mouth/Throat: Uvula is midline, oropharynx is clear and moist and mucous membranes are normal. No oropharyngeal exudate, posterior oropharyngeal edema, posterior oropharyngeal erythema or tonsillar abscesses.       R turbinate swelling  Eyes: Conjunctivae and EOM are normal. Pupils are equal, round, and reactive to light. No scleral icterus.  Neck: Normal range of motion. Neck supple.  Cardiovascular: Normal rate, regular rhythm, normal heart sounds and intact distal pulses.   No murmur heard. Pulmonary/Chest: Effort normal and breath sounds normal. No respiratory distress. She has no wheezes. She has no rales.       rattly  cough present  Lymphadenopathy:    She has no cervical adenopathy.  Skin: Skin is warm and dry. No rash noted.       Assessment & Plan:

## 2011-06-09 NOTE — Assessment & Plan Note (Signed)
Given duration and progression of sxs, will treat with zpack. cheratussin for cough.  Sedation precautions discussed. Update [place] if not improving as expected.

## 2011-06-09 NOTE — Patient Instructions (Signed)
I think you've developed bronchitis. Cough syrup for night time. Take zpack. Push fluids and rest. Nasal saline spray throughout the day. Update [place] if not improving as expected. Continue mucinex.

## 2011-06-10 ENCOUNTER — Ambulatory Visit: Payer: Federal, State, Local not specified - PPO | Admitting: Family Medicine

## 2011-06-11 ENCOUNTER — Encounter: Payer: Self-pay | Admitting: *Deleted

## 2011-10-11 ENCOUNTER — Telehealth: Payer: Self-pay | Admitting: Family Medicine

## 2011-10-11 ENCOUNTER — Encounter: Payer: Self-pay | Admitting: Family Medicine

## 2011-10-11 ENCOUNTER — Ambulatory Visit (INDEPENDENT_AMBULATORY_CARE_PROVIDER_SITE_OTHER): Payer: Federal, State, Local not specified - PPO | Admitting: Family Medicine

## 2011-10-11 VITALS — BP 100/60 | HR 72 | Temp 98.3°F | Wt 137.8 lb

## 2011-10-11 DIAGNOSIS — R091 Pleurisy: Secondary | ICD-10-CM

## 2011-10-11 NOTE — Patient Instructions (Addendum)
Take ibuprofen with food.  This should gradually get better, but it may take a few weeks. Take care.

## 2011-10-11 NOTE — Progress Notes (Signed)
Saturday AM she had a backache.  Then she had pain with deep breath.  "I thought I pulled something."   Worse at night.  She took some prednisone and felt better. This AM, the pain returned. No pain with shallow breath. B on mid chest wall but R side more prominent than L.  No rash. Skin isn't sensitive.  No FCNAVD.  No cough.  Mild ST Saturday AM.  Mult sick exposures at work.  This sx aren't exertional.  Not SOB.   Meds, vitals, and allergies reviewed.   ROS: See HPI.  Otherwise, noncontributory.  nad ncat Tm wnl Nasal and op exam wnl Neck supple rrr ctab Chest wall not ttp Pain with deep breath as above Ext well perfused

## 2011-10-11 NOTE — Telephone Encounter (Signed)
She was seen  

## 2011-10-11 NOTE — Telephone Encounter (Signed)
°  Caller: Jenice/Patient; PCP: Tower, Marne A.; CB#: [phone];  Call regarding Chest Pain and Worse When She Takes in A Deep Breath.   SX started 10/09/11. No cough. Pt has an appt with Para March today at 1515. Traiged Breathing Problems and pt has taken Ibprofen and old Prednisone.   All emergent SX R/O.  Disp = needs to be seen in 4 hrs for eval for worsening breathing problems no responding to tx.  Pt inst to keep appt at 1515 and call back inst given.

## 2011-10-11 NOTE — Assessment & Plan Note (Signed)
Likely viral with preceding URI sx.  Nontoxic, ddx d/w pt.  Should improve, f/u prn.

## 2011-10-11 NOTE — Telephone Encounter (Signed)
Patient called our triage line and left message it looks like in addition to calling CAN.

## 2012-01-21 ENCOUNTER — Other Ambulatory Visit: Payer: Self-pay | Admitting: *Deleted

## 2012-01-21 MED ORDER — ACYCLOVIR 200 MG PO CAPS: 200.0000 mg | ORAL_CAPSULE | Freq: Three times a day (TID) | ORAL | Status: DC

## 2012-03-06 ENCOUNTER — Encounter: Payer: Self-pay | Admitting: Family Medicine

## 2012-03-06 ENCOUNTER — Telehealth: Payer: Self-pay | Admitting: Family Medicine

## 2012-03-06 ENCOUNTER — Ambulatory Visit (INDEPENDENT_AMBULATORY_CARE_PROVIDER_SITE_OTHER): Payer: Federal, State, Local not specified - PPO | Admitting: Family Medicine

## 2012-03-06 VITALS — BP 106/70 | HR 78 | Temp 98.2°F | Ht 64.0 in | Wt 142.0 lb

## 2012-03-06 DIAGNOSIS — Z020 Encounter for examination for admission to educational institution: Secondary | ICD-10-CM

## 2012-03-06 DIAGNOSIS — Z0289 Encounter for other administrative examinations: Secondary | ICD-10-CM

## 2012-03-06 DIAGNOSIS — Z0000 Encounter for general adult medical examination without abnormal findings: Secondary | ICD-10-CM

## 2012-03-06 LAB — POCT URINALYSIS DIPSTICK
Bilirubin, UA: NEGATIVE
Blood, UA: NEGATIVE
Nitrite, UA: NEGATIVE
Spec Grav, UA: 1.01
pH, UA: 7.5

## 2012-03-06 MED ORDER — ACYCLOVIR 200 MG PO CAPS: 200.0000 mg | ORAL_CAPSULE | Freq: Three times a day (TID) | ORAL | Status: DC

## 2012-03-06 NOTE — Telephone Encounter (Signed)
Yes- go ahead and put her in wherever she needs-- please remind her to bring the forms

## 2012-03-06 NOTE — Progress Notes (Signed)
Subjective:    Patient ID: Kim Walsh, female    DOB: 12/28/1985, 26 y.o.   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 -needs refil   Has not had her pap smear yet - will hold off on gyn exam until next visit (or pt will see gyn)- since today is a short notice appt No gyn problems  Using condoms for contraception   Lab Results  Component Value Date   CHOL 135 11/14/2007   HDL 38.7* 11/14/2007   LDLCALC 92 11/14/2007   TRIG 23 11/14/2007   CHOLHDL 3.5 CALC 11/14/2007    Lab Results  Component Value Date   WBC 9.6 06/07/2010   HGB 12.0 06/07/2010   HCT 34.8* 06/07/2010   MCV 86.8 06/07/2010   PLT 239 06/07/2010   takes iron  ua today is neg Vision ok  utd PPD  Needs refil on zovirax caps for cold sores prn  Patient Active Problem List  Diagnosis  . HSV  . SICKLE-CELL TRAIT  . BREAST MASS, RIGHT  . ECZEMA  . FIBROMYALGIA  . HEADACHE  . ANA POSITIVE  . Change in hearing  . Vision changes  . Atopic dermatitis  . Shrimp allergy  . Foreign body  . Bronchitis  . Pleurisy  . Routine general medical examination at a health care facility   Past Medical History  Diagnosis Date  . Headache   . Tonsillitis   . Fibromyalgia   . Eczema    No past surgical history on file. History  Substance Use Topics  . Smoking status: Never Smoker   . Smokeless tobacco: Never Used  . Alcohol Use: Yes     Comment: occassionally wine   Family History  Problem Relation Age of Onset  . Cancer Maternal Grandmother     Breast   Allergies  Allergen Reactions  . Pseudoephedrine     REACTION: face itched, rash  . Shrimp (Shellfish Allergy)   . Sulfa Antibiotics    Current Outpatient Prescriptions on  File Prior to Visit  Medication Sig Dispense Refill  . acyclovir (ZOVIRAX) 200 MG capsule Take 1 capsule (200 mg total) by mouth 3 (three) times daily. For 5 days as needed for cold sore.  15 capsule  2  . acyclovir (ZOVIRAX) 5 % cream Apply 1 application topically 5 (five) times daily.  15 g  0  . mometasone (ELOCON) 0.1 % cream Apply to affected area for eczema once daily as needed - small amount.       . ferrous sulfate 325 (65 FE) MG tablet Take 325 mg by mouth daily with breakfast.             Review of Systems Review of Systems  Constitutional: Negative for fever, appetite change, fatigue and unexpected weight change.  Eyes: Negative for pain and visual disturbance.  Respiratory: Negative for cough and shortness of breath.   Cardiovascular: Negative for cp or palpitations    Gastrointestinal: Negative for nausea, diarrhea and constipation.  Genitourinary: Negative for urgency and frequency.  Skin: Negative for pallor or rash  neg for  cold sores today Neurological: Negative for weakness, light-headedness, numbness and headaches.  Hematological: Negative for adenopathy. Does not bruise/bleed easily.  Psychiatric/Behavioral: Negative for dysphoric mood. The patient is not nervous/anxious.         Objective:   Physical Exam  Constitutional: She appears well-developed and well-nourished. No distress.  HENT:  Head: Normocephalic and atraumatic.  Right Ear: External ear normal.  Left Ear: External ear normal.  Nose: Nose normal.  Mouth/Throat: Oropharynx is clear and moist. No oropharyngeal exudate.  Eyes: Conjunctivae normal and EOM are normal. Pupils are equal, round, and reactive to light. Right eye exhibits no discharge. Left eye exhibits no discharge. No scleral icterus.  Neck: Normal range of motion. Neck supple. No JVD present. Carotid bruit is not present. No thyromegaly present.  Cardiovascular: Normal rate, regular rhythm, normal heart sounds and intact distal pulses.   Exam reveals no gallop.   Pulmonary/Chest: Effort normal and breath sounds normal. No respiratory distress. She has no wheezes.  Abdominal: Soft. Bowel sounds are normal. She exhibits no distension, no abdominal bruit and no mass. There is no tenderness.  Musculoskeletal: Normal range of motion. She exhibits no edema and no tenderness.  Lymphadenopathy:    She has no cervical adenopathy.  Neurological: She is alert. She has normal reflexes. No cranial nerve deficit. She exhibits normal muscle tone. Coordination normal.  Skin: Skin is warm and dry. No rash noted. No erythema. No pallor.  Psychiatric: She has a normal mood and affect.          Assessment & Plan:

## 2012-03-06 NOTE — Telephone Encounter (Signed)
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  Your schedule for CPE is full until July 17, 2012. Pt says she needs the CPE today or Friday. Can you accommodate her? Thank you.

## 2012-03-06 NOTE — Patient Instructions (Addendum)
Keep up the good work with healthy habits  No restrictions for school

## 2012-03-07 ENCOUNTER — Other Ambulatory Visit: Payer: Self-pay | Admitting: *Deleted

## 2012-06-01 ENCOUNTER — Ambulatory Visit (INDEPENDENT_AMBULATORY_CARE_PROVIDER_SITE_OTHER): Payer: Federal, State, Local not specified - PPO | Admitting: Family Medicine

## 2012-06-01 ENCOUNTER — Encounter: Payer: Self-pay | Admitting: Family Medicine

## 2012-06-01 ENCOUNTER — Telehealth: Payer: Self-pay | Admitting: Family Medicine

## 2012-06-01 VITALS — BP 100/70 | HR 64 | Temp 98.2°F | Ht 64.0 in | Wt 146.8 lb

## 2012-06-01 DIAGNOSIS — K1370 Unspecified lesions of oral mucosa: Secondary | ICD-10-CM | POA: Insufficient documentation

## 2012-06-01 MED ORDER — NYSTATIN 100000 UNIT/ML MT SUSP: 500000.0000 [IU] | Freq: Three times a day (TID) | OROMUCOSAL | Status: DC

## 2012-06-01 NOTE — Assessment & Plan Note (Signed)
Most white plaque is from biting inner cheeks.. Some may be thrush.. Will treat with nystatin.

## 2012-06-01 NOTE — Progress Notes (Signed)
  Subjective:    Patient ID: Kim Walsh, female    DOB: December 09, 1985, 27 y.o.   MRN: [id number]  HPI  27 year old female pt of Dr. Royden Purl presents with white "stuff " on tounge in last few weeks. HSe can wipe it away. She is concerned that it may be thrush. Mild sore throat x 8 hours. Has also been having some fatigue in last few months.  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 No recent dental or mouth work.  No immunosuppression. Uses elocon for eczema for flares twice a month.     Review of Systems  Constitutional: Positive for fatigue. Negative for fever and unexpected weight change.  HENT: Negative for ear pain.   Eyes: Negative for pain.  Respiratory: Negative for chest tightness and shortness of breath.   Cardiovascular: Negative for chest pain, palpitations and leg swelling.       Occ sharp pain in left lower rib cage  Gastrointestinal: Negative for abdominal pain.  Genitourinary: Negative for dysuria.  Neurological: Negative for syncope.       Objective:   Physical Exam  Constitutional: She appears well-developed.  HENT:  Head: Normocephalic.  Right Ear: Tympanic membrane, external ear and ear canal normal. No middle ear effusion.  Left Ear: Tympanic membrane, external ear and ear canal normal.  No middle ear effusion.  Nose: Nose normal. No mucosal edema or rhinorrhea. Right sinus exhibits no maxillary sinus tenderness and no frontal sinus tenderness. Left sinus exhibits no maxillary sinus tenderness and no frontal sinus tenderness.  Mouth/Throat: Uvula is midline and oropharynx is clear and moist. No oropharyngeal exudate or posterior oropharyngeal erythema.  Mucus mem: tounge normal, no white plaque.[place]if[place]on Asters on B inner cheecks where teeth bite, small patch anterior ( not from teeth of white plaque, possible thrush)  Eyes: Conjunctivae and EOM are normal. Pupils are equal, round, and reactive to light. Right eye exhibits no discharge.  Neck: Normal range of  motion. Neck supple. No thyromegaly present.  Cardiovascular: Normal rate, regular rhythm and normal heart sounds.  Exam reveals no gallop and no friction rub.   No murmur heard. Pulmonary/Chest: Effort normal and breath sounds normal.          Assessment & Plan:

## 2012-06-01 NOTE — Patient Instructions (Addendum)
Start nystatin swish and spit 2-3 times daily for next week. Don't expect white area where teeth bite to go away unless you stop biting your cheeks. Call if fatigue is not improving.. Try to get adequate sleep at night, regular exercise and healthy eating. Make sure to have pap smear with Gyn or to reschedule with Dr. Milinda Antis in next year.

## 2012-06-01 NOTE — Telephone Encounter (Signed)
Patient Information:  Caller Name: Winfred  Phone: [phone]  Patient: Kim Walsh, Kim Walsh  Gender: Female  DOB: 08-07-1985  Age: 27 Years  PCP: Tower, [employer] [facility])  Pregnant: No  Office Follow Up:  Does the office need to follow up with this patient?: No  Instructions For The Office: N/A  RN Note:  Has had white coating on tongue for a few weeks  she said it will come off a little bit when brushes her teeth but not all the way off, no bleeding and is able to eat or drink  Symptoms  Reason For Call & Symptoms: tongue has white coating on tongue  Reviewed Health History In EMR: Yes  Reviewed Medications In EMR: Yes  Reviewed Allergies In EMR: Yes  Reviewed Surgeries / Procedures: Yes  Date of Onset of Symptoms: 05/15/2012 OB / GYN:  LMP: 04/27/2012  Guideline(s) Used:  Thrush  Disposition Per Guideline:   Home Care  Reason For Disposition Reached:   Probable thrush  Advice Given:  Call Back If:  Your child becomes worse  RN Overrode Recommendation:  Make Appointment  going to make appt for today  Appointment Scheduled:  06/01/2012 15:30:00 Appointment Scheduled Provider:  Kerby Nora [facility])

## 2012-10-20 ENCOUNTER — Ambulatory Visit: Payer: Federal, State, Local not specified - PPO | Admitting: Family Medicine

## 2013-05-09 ENCOUNTER — Other Ambulatory Visit: Payer: Self-pay | Admitting: Family Medicine

## 2013-05-09 NOTE — Telephone Encounter (Signed)
Last filled 03/06/2012

## 2013-05-10 MED ORDER — ACYCLOVIR 200 MG PO CAPS: 200.0000 mg | ORAL_CAPSULE | Freq: Three times a day (TID) | ORAL | Status: DC

## 2013-05-10 NOTE — Telephone Encounter (Signed)
Please refill times 3 

## 2013-05-10 NOTE — Telephone Encounter (Signed)
done

## 2014-03-13 ENCOUNTER — Other Ambulatory Visit: Payer: Self-pay | Admitting: Family Medicine

## 2014-03-13 NOTE — Telephone Encounter (Signed)
Please refill times one  Schedule winter f/u with me thanks

## 2014-03-13 NOTE — Telephone Encounter (Signed)
Electronic refill request, last appt with you was 03/2012 and no future appt., please advise

## 2014-03-14 NOTE — Telephone Encounter (Signed)
See prev note. I refilled Rx, please schedule a med refill appt some time this winter per Dr. [name]

## 2014-03-14 NOTE — Telephone Encounter (Signed)
LVM for pt to call back and schedule med refill appt.

## 2014-06-07 LAB — HEPATIC FUNCTION PANEL
ALT: 36 U/L — AB (ref 7–35)
AST: 27 U/L (ref 13–35)
Alkaline Phosphatase: 57 U/L (ref 25–125)
Bilirubin, Total: 1.1 mg/dL

## 2014-06-07 LAB — LIPID PANEL
Cholesterol: 141 mg/dL (ref 0–200)
HDL: 46 mg/dL (ref 35–70)
LDL Cholesterol: 85 mg/dL
TRIGLYCERIDES: 50 mg/dL (ref 40–160)

## 2014-06-07 LAB — BASIC METABOLIC PANEL
BUN: 10 mg/dL (ref 4–21)
CREATININE: 0.8 mg/dL (ref 0.5–1.1)

## 2014-06-14 ENCOUNTER — Encounter: Payer: Self-pay | Admitting: Family Medicine

## 2014-06-14 ENCOUNTER — Other Ambulatory Visit (HOSPITAL_COMMUNITY)
Admission: RE | Admit: 2014-06-14 | Discharge: 2014-06-14 | Disposition: A | Discharge status: Home / Self Care | Payer: Managed Care, Other (non HMO) | Source: Ambulatory Visit | Attending: Family Medicine | Admitting: Family Medicine

## 2014-06-14 ENCOUNTER — Ambulatory Visit (INDEPENDENT_AMBULATORY_CARE_PROVIDER_SITE_OTHER): Payer: Managed Care, Other (non HMO) | Admitting: Family Medicine

## 2014-06-14 VITALS — BP 98/60 | HR 77 | Temp 98.0°F | Ht 63.75 in | Wt 156.8 lb

## 2014-06-14 DIAGNOSIS — Z01419 Encounter for gynecological examination (general) (routine) without abnormal findings: Secondary | ICD-10-CM | POA: Insufficient documentation

## 2014-06-14 DIAGNOSIS — Z113 Encounter for screening for infections with a predominantly sexual mode of transmission: Secondary | ICD-10-CM | POA: Diagnosis not present

## 2014-06-14 DIAGNOSIS — G43709 Chronic migraine without aura, not intractable, without status migrainosus: Secondary | ICD-10-CM

## 2014-06-14 DIAGNOSIS — Z0000 Encounter for general adult medical examination without abnormal findings: Secondary | ICD-10-CM | POA: Diagnosis not present

## 2014-06-14 MED ORDER — SUMATRIPTAN SUCCINATE 100 MG PO TABS: 100.0000 mg | ORAL_TABLET | ORAL | Status: DC | PRN

## 2014-06-14 MED ORDER — ACYCLOVIR 200 MG PO CAPS: ORAL_CAPSULE | ORAL | Status: DC

## 2014-06-14 NOTE — Patient Instructions (Signed)
Get me a copy of your labs when you get them Keep up the good habits  Pap done with gc/chlamidia screen  imitrex for headaches as needed

## 2014-06-14 NOTE — Progress Notes (Signed)
Pre visit review using our clinic review tool, if applicable. No additional management support is needed unless otherwise documented below in the visit note. 

## 2014-06-14 NOTE — Progress Notes (Signed)
Subjective:    Patient ID: Kim Walsh, female    DOB: [birth year], 29 y.o.   MRN: [id number]  HPI Here for health maintenance exam and to review chronic medical problems    Wt is up 10 lb with bmi of 27  Doing very well  Has had a chance to travel Work is going well   Trying to do better with self care Diet is improved  More exercise - some run/walking and going to the gym   Gyn care Pap 8/10 normal here and has had pap 2 y ago at [place] screening -just had that with life insurance screening  Periods are regular - sometimes heavy and painful -- lasting about 7 days  Does not want any kind of hormonal tx   Migraines are more frequent /bothersome  About 2 times per month  In the past - took imitrex  Would like to have that on hand   Flu vaccine - had that in the fall   Td 8/10   Thinks she had labs with life ins co - still waiting on results -will hold off on that for now   Does not smoker or drink   Patient Active Problem List   Diagnosis Date Noted  . Oral lesion 06/01/2012  . Routine general medical examination at a health care facility 03/06/2012  . Pleurisy 10/11/2011  . Foreign body 04/15/2011  . Bronchitis 04/15/2011  . Atopic dermatitis 09/07/2010  . 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 Change in hearing 07/16/2010  . Vision changes 07/16/2010  . BREAST MASS, RIGHT 02/20/2009  . 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 SICKLE-CELL TRAIT 08/02/2006  . ECZEMA 08/02/2006  . FIBROMYALGIA 08/02/2006  . HEADACHE 08/02/2006  . ANA POSITIVE 08/02/2006   Past Medical History  Diagnosis Date  . Headache(784.0)   . Tonsillitis   . Fibromyalgia   . Eczema    No past surgical history on file. History  Substance Use Topics  . Smoking status: Never Smoker   . Smokeless tobacco: Never Used  . Alcohol Use: Yes     Comment: occassionally wine   Family History  Problem Relation Age of Onset  . Cancer Maternal Grandmother     Breast   Allergies  Allergen Reactions  .  Pseudoephedrine     REACTION: face itched, rash  . Shrimp [Shellfish Allergy]   . Sulfa Antibiotics    Current Outpatient Prescriptions on File Prior to Visit  Medication Sig Dispense Refill  . acyclovir (ZOVIRAX) 200 MG capsule TAKE 1 CAPSULE (200 MG TOTAL) BY MOUTH 3 (THREE) TIMES DAILY. FOR 5 DAYS AS NEEDED FOR COLD SORE. 15 capsule 0  . acyclovir (ZOVIRAX) 5 % cream Apply 1 application topically 5 (five) times daily. 15 g 0  . mometasone (ELOCON) 0.1 % cream Apply to affected area for eczema once daily as needed - small amount.      No current facility-administered medications on file prior to visit.    Review of Systems Review of Systems  Constitutional: Negative for fever, appetite change, fatigue and unexpected weight change.  Eyes: Negative for pain and visual disturbance.  Respiratory: Negative for cough and shortness of breath.   Cardiovascular: Negative for cp or palpitations    Gastrointestinal: Negative for nausea, diarrhea and constipation.  Genitourinary: Negative for urgency and frequency.  Skin: Negative for pallor or rash   Neurological: Negative for weakness, light-headedness, numbness and pos for occ migraine headaches.  Hematological: Negative for adenopathy. Does not bruise/bleed  easily.  Psychiatric/Behavioral: Negative for dysphoric mood. The patient is not nervous/anxious.         Objective:   Physical Exam  Constitutional: She appears well-developed and well-nourished. No distress.  overwt and well app  HENT:  Head: Normocephalic and atraumatic.  Right Ear: External ear normal.  Left Ear: External ear normal.  Nose: Nose normal.  Mouth/Throat: Oropharynx is clear and moist.  Eyes: Conjunctivae and EOM are normal. Pupils are equal, round, and reactive to light. Right eye exhibits no discharge. Left eye exhibits no discharge. No scleral icterus.  Neck: Normal range of motion. Neck supple. No JVD present. No thyromegaly present.  Cardiovascular: Normal  rate, regular rhythm, normal heart sounds and intact distal pulses.  Exam reveals no gallop.   Pulmonary/Chest: Effort normal and breath sounds normal. No respiratory distress. She has no wheezes. She has no rales.  Abdominal: Soft. Bowel sounds are normal. She exhibits no distension and no mass. There is no tenderness.  Genitourinary: No breast swelling, tenderness, discharge or bleeding. There is no rash, tenderness or lesion on the right labia. There is no rash, tenderness or lesion on the left labia. Uterus is not enlarged and not tender. Cervix exhibits no motion tenderness, no discharge and no friability. Right adnexum displays no mass, no tenderness and no fullness. Left adnexum displays no mass, no tenderness and no fullness. No tenderness in the vagina. No signs of injury around the vagina. No vaginal discharge found.  Scant blood at os- starting menses  Breast exam: No mass, nodules, thickening, tenderness, bulging, retraction, inflamation, nipple discharge or skin changes noted.  No axillary or clavicular LA.      Musculoskeletal: She exhibits no edema or tenderness.  Lymphadenopathy:    She has no cervical adenopathy.  Neurological: She is alert. She has normal reflexes. No cranial nerve deficit. She exhibits normal muscle tone. Coordination normal.  Skin: Skin is warm and dry. No rash noted. No erythema. No pallor.  Psychiatric: She has a normal mood and affect.          Assessment & Plan:   Problem List Items Addressed This Visit      Cardiovascular and Mediastinum   Migraine    Pt has hx of migraines w/o aura  Not daily Given px for imitrex 100 to have on hand-= has used it before Rev lifestyle habits to prevent migraine       Relevant Medications   SUMAtriptan (IMITREX) tablet     Other   Encounter for routine gynecological examination    Exam done Also gc/chlam for screening  No gyn complaints       Relevant Orders   Cytology - PAP   Routine general medical  examination at a health care facility - Primary    Reviewed health habits including diet and exercise and skin cancer prevention Reviewed appropriate screening tests for age  Also reviewed health mt list, fam hx and immunization status , as well as social and family history   See HPI  Pt will get [place] her lab results from recent life insurance screening       Screen for STD (sexually transmitted disease)    Per request will do gc/chlam screening with pap No symptoms  Rev std prevention       Relevant Orders   Cytology - PAP    Other Visit Diagnoses    Annual physical exam

## 2014-06-16 NOTE — Assessment & Plan Note (Signed)
Pt has hx of migraines w/o aura  Not daily Given px for imitrex 100 to have on hand-= has used it before Rev lifestyle habits to prevent migraine

## 2014-06-16 NOTE — Assessment & Plan Note (Signed)
Exam done Also gc/chlam for screening  No gyn complaints

## 2014-06-16 NOTE — Assessment & Plan Note (Signed)
Per request will do gc/chlam screening with pap No symptoms  Rev std prevention

## 2014-06-16 NOTE — Assessment & Plan Note (Signed)
Reviewed health habits including diet and exercise and skin cancer prevention Reviewed appropriate screening tests for age  Also reviewed health mt list, fam hx and immunization status , as well as social and family history   See HPI  Pt will get [place] her lab results from recent life insurance screening

## 2014-06-17 LAB — CYTOLOGY - PAP

## 2014-06-18 ENCOUNTER — Telehealth: Payer: Self-pay

## 2014-06-18 NOTE — Telephone Encounter (Signed)
Patient aware of pap and std results.

## 2014-06-18 NOTE — Telephone Encounter (Signed)
-----   Message from [name], MD sent at 06/17/2014  9:21 PM EDT ----- Pap and std tests are negative

## 2014-11-07 ENCOUNTER — Encounter: Payer: Self-pay | Admitting: Family Medicine

## 2014-11-07 ENCOUNTER — Ambulatory Visit (INDEPENDENT_AMBULATORY_CARE_PROVIDER_SITE_OTHER): Payer: Managed Care, Other (non HMO) | Admitting: Family Medicine

## 2014-11-07 VITALS — BP 108/68 | HR 73 | Temp 98.6°F | Ht 63.75 in | Wt 162.2 lb

## 2014-11-07 DIAGNOSIS — R1012 Left upper quadrant pain: Secondary | ICD-10-CM

## 2014-11-07 DIAGNOSIS — L989 Disorder of the skin and subcutaneous tissue, unspecified: Secondary | ICD-10-CM

## 2014-11-07 LAB — COMPLETE METABOLIC PANEL WITH GFR
ALBUMIN: 4.1 g/dL (ref 3.6–5.1)
ALT: 13 U/L (ref 6–29)
AST: 18 U/L (ref 10–30)
Alkaline Phosphatase: 50 U/L (ref 33–115)
BILIRUBIN TOTAL: 0.6 mg/dL (ref 0.2–1.2)
BUN: 15 mg/dL (ref 7–25)
CALCIUM: 9.7 mg/dL (ref 8.6–10.2)
CO2: 25 mmol/L (ref 20–31)
CREATININE: 0.78 mg/dL (ref 0.50–1.10)
Chloride: 109 mmol/L (ref 98–110)
GFR, Est Non African American: >89 mL/min (ref >=60)
GLUCOSE: 98 mg/dL (ref 65–99)
POTASSIUM: 4.7 mmol/L (ref 3.5–5.3)
SODIUM: 144 mmol/L (ref 135–146)
Total Protein: 7 g/dL (ref 6.1–8.1)

## 2014-11-07 MED ORDER — ACYCLOVIR 200 MG PO CAPS: ORAL_CAPSULE | ORAL | Status: DC

## 2014-11-07 MED ORDER — OMEPRAZOLE 40 MG PO CPDR: 40.0000 mg | DELAYED_RELEASE_CAPSULE | Freq: Every day | ORAL | Status: DC

## 2014-11-07 NOTE — Progress Notes (Signed)
Subjective:  Patient ID: Kim Walsh, female    DOB: Sep 19, 1985  Age: 29 y.o. MRN: [id number]  CC: Abdominal pain; Concern regarding breast  HPI 29 year old female presents to the clinic today with complaints of abdominal pain.  She also has a concern regarding her R breast.  1) Abdominal pain  Patient reports a 2 to 3 week history of left upper quadrant pain.  She states that the pain is intermittent and moderate in severity (5/10)  She describes the pain as dull/achy. She also states that it burns.  Exacerbating factors: Worse after eating intermittently.  Relieving factors: Maalox, and direct pressure.  Associated symptoms: Nausea. She has had a few episodes of nonbilious nonbloody emesis. No associated fevers or chills. No hematochezia or melanoma.  Patient denies alcohol use as well as chronic NSAID use.  Patient does report that she has had some heartburn/reflux recently as well.  2) Draining lesion under right breast  Patient reports a year history of a small open area under her right breast.  She states that it intermittently drains.  She states that the drainage is foul smelling but is not purulent.  No surrounding erythema.  No associated fevers or chills.  No pain around the area.  Patient is concerned as she has a long family history of breast cancer like it evaluated today.  No exacerbating or relieving factors.  Social Hx - Nonsmoker.   Review of Systems  Constitutional: Negative for fever and chills.  Gastrointestinal: Positive for nausea, vomiting and abdominal pain. Negative for diarrhea, constipation and blood in stool.    Objective:  BP 108/68 mmHg  Pulse 73  Temp(Src) 98.6 F (37 C) (Oral)  Ht 5' 3.75" (1.619 m)  Wt 162 lb 4 oz (73.596 kg)  BMI 28.08 kg/m2  SpO2 96%  LMP 11/03/2014  BP/Weight 11/07/2014 06/14/2014 06/26/5571  Systolic BP 220 98 254  Diastolic BP 68 60 70  Wt. (Lbs) 162.25 156.8 146.75  BMI 28.08 27.13 25.18    Physical Exam  Constitutional: She is oriented to person, place, and time. She appears well-developed and well-nourished. No distress.  HENT:  Head: Normocephalic and atraumatic.  Cardiovascular: Normal rate and regular rhythm.   No murmur heard. Pulmonary/Chest: Effort normal and breath sounds normal. No respiratory distress. She has no wheezes. She has no rales.  Small (approximately 2 mm) open lesion noted. No stranding erythema or drainage. Nontender to palpation.  Abdominal: Soft. She exhibits no mass. There is no rebound and no guarding.  Tender to palpation in the epigastric/left upper quadrant.  Neurological: She is alert and oriented to person, place, and time.  Psychiatric: She has a normal mood and affect.    Assessment & Plan:   Problem List Items Addressed This Visit    Skin lesion    Patient has a very small open area under her right breast. This appears to be an underlying cyst (possibly a fistula).  No signs of infection at this time. We discussed watchful waiting vs referral to surgery.  Patient declined referral today.       Left upper quadrant pain - Primary    New problem. Vital signs stable and exam notable only for mild epigastric/left upper quadrant pain. Likely etiology is gastritis versus MSK pain. Obtaining metabolic panel and lipase today for further workup. Treating with PPI. Rx for omeprazole given today Patient to follow closely with her primary physician.      Relevant Orders   COMPLETE METABOLIC PANEL  WITH GFR (Completed)   Lipase      Meds ordered this encounter  Medications  . omeprazole (PRILOSEC) 40 MG capsule    Sig: Take 1 capsule (40 mg total) by mouth daily.    Dispense:  30 capsule    Refill:  1  . acyclovir (ZOVIRAX) 200 MG capsule    Sig: TAKE 1 CAPSULE (200 MG TOTAL) BY MOUTH 3 (THREE) TIMES DAILY. FOR 5 DAYS AS NEEDED FOR COLD SORE.    Dispense:  15 capsule    Refill:  5    Follow-up: PRN   Thersa Salt, DO

## 2014-11-07 NOTE — Patient Instructions (Signed)
It was nice to see you today.  Take the medication daily for the next month.  We will call with the results of your labs.  Follow up closely with your primary.  Take care  Dr. Lacinda Axon

## 2014-11-08 ENCOUNTER — Encounter: Payer: Self-pay | Admitting: Family Medicine

## 2014-11-08 DIAGNOSIS — R1012 Left upper quadrant pain: Secondary | ICD-10-CM | POA: Insufficient documentation

## 2014-11-08 DIAGNOSIS — L989 Disorder of the skin and subcutaneous tissue, unspecified: Secondary | ICD-10-CM | POA: Insufficient documentation

## 2014-11-08 LAB — LIPASE: Lipase: 25 U/L (ref 11.0–59.0)

## 2014-11-08 NOTE — Assessment & Plan Note (Signed)
Patient has a very small open area under her right breast. This appears to be an underlying cyst (possibly a fistula).  No signs of infection at this time. We discussed watchful waiting vs referral to surgery.  Patient declined referral today.

## 2014-11-08 NOTE — Assessment & Plan Note (Signed)
New problem. Vital signs stable and exam notable only for mild epigastric/left upper quadrant pain. Likely etiology is gastritis versus MSK pain. Obtaining metabolic panel and lipase today for further workup. Treating with PPI. Rx for omeprazole given today Patient to follow closely with her primary physician.

## 2015-03-13 ENCOUNTER — Encounter: Payer: Self-pay | Admitting: Podiatry

## 2015-03-13 ENCOUNTER — Ambulatory Visit
Admission: RE | Admit: 2015-03-13 | Discharge: 2015-03-13 | Disposition: A | Discharge status: Home / Self Care | Payer: Managed Care, Other (non HMO) | Source: Ambulatory Visit | Attending: Podiatry | Admitting: Podiatry

## 2015-03-13 ENCOUNTER — Ambulatory Visit (INDEPENDENT_AMBULATORY_CARE_PROVIDER_SITE_OTHER): Payer: Managed Care, Other (non HMO) | Admitting: Podiatry

## 2015-03-13 VITALS — BP 109/68 | HR 77 | Ht 64.0 in | Wt 162.0 lb

## 2015-03-13 DIAGNOSIS — M21969 Unspecified acquired deformity of unspecified lower leg: Secondary | ICD-10-CM

## 2015-03-13 DIAGNOSIS — M722 Plantar fascial fibromatosis: Secondary | ICD-10-CM

## 2015-03-13 NOTE — Progress Notes (Signed)
SUBJECTIVE: 29 y.o. year old female presents with pain on bottom of both feet more on the left than the right duration of several years, off and on. This week the pain has been bad. On feet at work 12 hours/day, 4 days a week. Been diagnosed with Fibromyalgia in 2006 due to pain in arms and back. Not taking any medication for it yet.  Patient is referred by Dr. [name].   REVIEW OF SYSTEMS: Constitutional: negative Eyes: negative Ears, nose, mouth, throat, and face: negative Respiratory: negative Cardiovascular: negative Gastrointestinal: negative Genitourinary:negative Integument/breast: negative Hematologic/lymphatic: negative Endocrine: negative Allergic/Immunologic: negative  OBJECTIVE: DERMATOLOGIC EXAMINATION: Nails: normal appearing nails bilaterally Skin Integrity: No abnormal findings.  VASCULAR EXAMINATION OF LOWER LIMBS: Pedal pulses: All pedal pulses are palpable with normal pulsation.  No edema or erythema noted.Temperature gradient from tibial crest to dorsum of foot is within normal bilateral.  NEUROLOGIC EXAMINATION OF THE LOWER LIMBS: Achilles DTR is present and within normal. Monofilament (Semmes-Weinstein 10-gm) sensory testing positive 6 out of 6, bilateral. Vibratory sensations(128Hz  turning fork) intact at medial and lateral forefoot bilateral.  Sharp and Dull discriminatory sensations at the plantar ball of hallux is intact bilateral.   MUSCULOSKELETAL EXAMINATION: Positive for tight Achilles tendon bilateral. Mild forefoot varus with elevated first ray bilateral.   RADIOGRAPHIC FINDINGS: General overview: Negative of Osteopenia Absence of soft tissue swelling. Absence of acute osseous or articular surfaces of forefoot or rearfoot.  AP View:  Short first metatrsal, -8 right, -10 left, increased lateral deviation angle of Calcaneocuboid angle right, increased phalangeal head 5th right. Fibular sesamoid position 4 on left, 3 on right. Lateral view:   Pronated foot with midtarsal sagging  Posterior calcaneal purs present bilateral.  Decreased calcaneal inclination angle bilateral: Normal CYMA line bilateral.  First ray elevatus L>R:   ASSESSMENT: 1. Plantar fasciitis bilateral. 2. STJ hyperpronation. 3. Elevated first ray bilateral.  PLAN: Reviewed clinical findings and available treatment options. Both feet casted for custom orthotics.

## 2015-03-13 NOTE — Patient Instructions (Signed)
Seen for painful feet. X-ray show short first metatarsal bone on both feet. Dx. Misaligned forefoot with subsequent Subtalar joint pronation. Both feet casted for custom orthotics.

## 2015-03-18 DIAGNOSIS — M2140 Flat foot [pes planus] (acquired), unspecified foot: Secondary | ICD-10-CM | POA: Insufficient documentation

## 2015-04-09 ENCOUNTER — Telehealth: Payer: Self-pay | Admitting: Family Medicine

## 2015-04-09 NOTE — Telephone Encounter (Signed)
It sounds like she ? Is having trouble with mychart or tele health- please have someone help her if necessary Thanks

## 2015-04-09 NOTE — Telephone Encounter (Signed)
atient Name: Kim Walsh  DOB: 1985-07-30    Initial Comment Caller states coughing, sore throat, slight wheezing, slight fever on and off, and congestion    Nurse Assessment  Nurse: Raphael Gibney, RN, Vanita Ingles Date/Time (Eastern Time): 04/09/2015 9:18:28 AM  Confirm and document reason for call. If symptomatic, describe symptoms. ---Caller states she is coughing, has sore throat. Has a lot of congestion. Has had fever off and on since Saturday. She has some slight wheezing. Has been coughing with nasal congestion since Saturday.  Has the patient traveled out of the country within the last 30 days? ---No  Does the patient have any new or worsening symptoms? ---Yes  Will a triage be completed? ---Yes  Related visit to physician within the last 2 weeks? ---No  Does the PT have any chronic conditions? (i.e. diabetes, asthma, etc.) ---No  Did the patient indicate they were pregnant? ---No  Is this a behavioral health or substance abuse call? ---No     Guidelines    Guideline Title Affirmed Question Affirmed Notes  Cough - Acute Non-Productive SEVERE coughing spells (e.g., whooping sound after coughing, vomiting after coughing)    Final Disposition User   See Physician within 24 Hours [name], Therapist, sports, [place]    Comments  No appts available at [facility], [place] or [facility]. Pt does not want to go to urgent care  Advised caller of the [place] option and emailed the information to her. Advised caller that someone should call her back regarding telehealth. Verbalized understanding.  Advised caller that if she is unable to use the telehealth option within 24 hrs she needs to go to urgent care. Verbalized understanding.   Referrals  GO TO FACILITY REFUSED   Disagree/Comply: Disagree  Disagree/Comply Reason: Disagree with instructions

## 2015-04-09 NOTE — Telephone Encounter (Signed)
Patient Name: Kim Walsh  DOB: 1985/12/20    Initial Comment Caller states, did not get an email she thought the nurse was going to send to her, she wanted to speak w/ the same nurse to make sure the email address is correct.    Nurse Assessment  Nurse: Raphael Gibney, RN, Vanita Ingles Date/Time (Eastern Time): 04/09/2015 10:10:11 AM  Confirm and document reason for call. If symptomatic, describe symptoms. ---Caller states she did not get email about telehealth.  Has the patient traveled out of the country within the last 30 days? ---Not Applicable  Does the patient have any new or worsening symptoms? ---No  Please document clinical information provided and list any resource used. ---Advised caller that email would be resent. Verbalized understanding.     Guidelines    Guideline Title Affirmed Question Affirmed Notes       Final Disposition User   Clinical Call [place], RN, Vanita Ingles

## 2015-04-09 NOTE — Telephone Encounter (Signed)
Spoke with pt; pt did not want to go to [place] and pt does not want to go to [facility]. Pt said when went into mychart on computer said not available. Not available on our computers either but can get into mychart on phone app. Pt will try phone app and if that does not work pt will cb.

## 2015-04-09 NOTE — Telephone Encounter (Signed)
See previous [place] phone note for 04/09/15.

## 2015-04-10 NOTE — Telephone Encounter (Signed)
LMOM for Pt to call My Chart Help Desk to help her unlock her account.

## 2015-07-09 ENCOUNTER — Encounter: Payer: Self-pay | Admitting: Obstetrics and Gynecology

## 2015-07-09 ENCOUNTER — Ambulatory Visit (INDEPENDENT_AMBULATORY_CARE_PROVIDER_SITE_OTHER): Payer: Managed Care, Other (non HMO) | Admitting: Obstetrics and Gynecology

## 2015-07-09 VITALS — BP 99/63 | HR 86 | Resp 16 | Ht 64.0 in | Wt 163.0 lb

## 2015-07-09 DIAGNOSIS — Z1151 Encounter for screening for human papillomavirus (HPV): Secondary | ICD-10-CM | POA: Diagnosis not present

## 2015-07-09 DIAGNOSIS — Z124 Encounter for screening for malignant neoplasm of cervix: Secondary | ICD-10-CM | POA: Diagnosis not present

## 2015-07-09 DIAGNOSIS — Z01419 Encounter for gynecological examination (general) (routine) without abnormal findings: Secondary | ICD-10-CM | POA: Diagnosis not present

## 2015-07-09 LAB — CBC
HCT: 34 % — ABNORMAL LOW (ref 35.0–45.0)
HEMOGLOBIN: 11 g/dL — AB (ref 11.7–15.5)
MCH: 28.7 pg (ref 27.0–33.0)
MCHC: 32.4 g/dL (ref 32.0–36.0)
MCV: 88.8 fL (ref 80.0–100.0)
MPV: 9.3 fL (ref 7.5–12.5)
Platelets: 376 10*3/uL (ref 140–400)
RBC: 3.83 MIL/uL (ref 3.80–5.10)
RDW: 14.7 % (ref 11.0–15.0)
WBC: 4.6 10*3/uL (ref 3.8–10.8)

## 2015-07-09 NOTE — Progress Notes (Signed)
  Subjective:     Kim Walsh is a 30 y.o. female G0 who is here for a comprehensive physical exam. The patient reports no problems. She reports regular 4-6 day cycles. She is sexually active using condoms. She is planning on conceiving in the near future. She denies any abnormal discharge or pelvic pain  Past Medical History  Diagnosis Date  . Headache(784.0)   . Tonsillitis   . Fibromyalgia   . Eczema   . Congenitally short metatarsal     Bilateral    Past Surgical History  Procedure Laterality Date  . Tonsillectomy     Family History  Problem Relation Age of Onset  . Cancer Maternal Grandmother     Breast    Social History   Social History  . Marital Status: Single    Spouse Name: N/A  . Number of Children: 0  . Years of Education: N/A   Occupational History  . 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 Smoking status: Never Smoker   . Smokeless tobacco: Never Used  . Alcohol Use: 0.0 oz/week    0 Standard drinks or equivalent per week     Comment: occassionally wine  . Drug Use: No  . Sexual Activity:    Partners: Male    Birth Control/ Protection: Condom   Other Topics Concern  . Not on file   Social History Narrative   Health Maintenance  Topic Date Due  . HIV Screening  10/25/2001  . INFLUENZA VACCINE  11/04/2015  . PAP SMEAR  06/13/2017  . TETANUS/TDAP  11/19/2018       Review of Systems Pertinent items are noted in HPI.   Objective:      GENERAL: Well-developed, well-nourished female in no acute distress.  HEENT: Normocephalic, atraumatic. Sclerae anicteric.  NECK: Supple. Normal thyroid.  LUNGS: Clear to auscultation bilaterally.  HEART: Regular rate and rhythm. BREASTS: Symmetric in size. No palpable masses or lymphadenopathy, skin changes, or nipple drainage. ABDOMEN: Soft, nontender, nondistended. No organomegaly. PELVIC: Normal external female genitalia. Vagina is pink and rugated.  Normal discharge. Normal  appearing cervix. Uterus is normal in size. No adnexal mass or tenderness. EXTREMITIES: No cyanosis, clubbing, or edema, 2+ distal pulses.    Assessment:    Healthy female exam.      Plan:    Pap smear collected Fasting labs collected Patient will be notified of abnormal results Advised to start taking prenatal vitamins now RTC in 1 year or prn See After Visit Summary for Counseling Recommendations

## 2015-07-10 LAB — COMPREHENSIVE METABOLIC PANEL
ALBUMIN: 4 g/dL (ref 3.6–5.1)
ALK PHOS: 52 U/L (ref 33–115)
ALT: 23 U/L (ref 6–29)
AST: 21 U/L (ref 10–30)
BUN: 11 mg/dL (ref 7–25)
CHLORIDE: 108 mmol/L (ref 98–110)
CO2: 24 mmol/L (ref 20–31)
CREATININE: 0.76 mg/dL (ref 0.50–1.10)
Calcium: 8.9 mg/dL (ref 8.6–10.2)
Glucose, Bld: 89 mg/dL (ref 65–99)
POTASSIUM: 3.7 mmol/L (ref 3.5–5.3)
SODIUM: 140 mmol/L (ref 135–146)
TOTAL PROTEIN: 6.4 g/dL (ref 6.1–8.1)
Total Bilirubin: 0.5 mg/dL (ref 0.2–1.2)

## 2015-07-10 LAB — LIPID PANEL
CHOL/HDL RATIO: 3.8 ratio (ref <=5.0)
CHOLESTEROL: 162 mg/dL (ref 125–200)
HDL: 43 mg/dL — AB (ref >=46)
LDL Cholesterol: 110 mg/dL (ref <130)
TRIGLYCERIDES: 44 mg/dL (ref <150)
VLDL: 9 mg/dL (ref <30)

## 2015-07-10 LAB — TSH: TSH: 2.03 mIU/L

## 2015-07-10 LAB — CYTOLOGY - PAP

## 2016-05-13 ENCOUNTER — Ambulatory Visit (INDEPENDENT_AMBULATORY_CARE_PROVIDER_SITE_OTHER): Payer: Managed Care, Other (non HMO) | Admitting: Family Medicine

## 2016-05-13 ENCOUNTER — Encounter: Payer: Self-pay | Admitting: Family Medicine

## 2016-05-13 VITALS — BP 110/68 | HR 107 | Temp 98.0°F | Wt 159.4 lb

## 2016-05-13 DIAGNOSIS — N281 Cyst of kidney, acquired: Secondary | ICD-10-CM | POA: Diagnosis not present

## 2016-05-13 DIAGNOSIS — R1013 Epigastric pain: Secondary | ICD-10-CM | POA: Diagnosis not present

## 2016-05-13 DIAGNOSIS — M545 Low back pain: Secondary | ICD-10-CM | POA: Diagnosis not present

## 2016-05-13 DIAGNOSIS — K824 Cholesterolosis of gallbladder: Secondary | ICD-10-CM

## 2016-05-13 LAB — POCT URINALYSIS DIPSTICK
Bilirubin, UA: NEGATIVE
Blood, UA: NEGATIVE
Glucose, UA: NEGATIVE
Leukocytes, UA: NEGATIVE
Nitrite, UA: NEGATIVE
Protein, UA: NEGATIVE
Spec Grav, UA: 1.025
Urobilinogen, UA: 0.2
pH, UA: 6

## 2016-05-13 LAB — COMPREHENSIVE METABOLIC PANEL
ALT: 16 U/L (ref 0–35)
AST: 15 U/L (ref 0–37)
Albumin: 4.3 g/dL (ref 3.5–5.2)
Alkaline Phosphatase: 48 U/L (ref 39–117)
BUN: 12 mg/dL (ref 6–23)
CO2: 27 mEq/L (ref 19–32)
Calcium: 9.3 mg/dL (ref 8.4–10.5)
Chloride: 107 mEq/L (ref 96–112)
Creatinine, Ser: 0.83 mg/dL (ref 0.40–1.20)
GFR: 104.13 mL/min (ref >60.00)
Glucose, Bld: 161 mg/dL — ABNORMAL HIGH (ref 70–99)
Potassium: 3.7 mEq/L (ref 3.5–5.1)
Sodium: 138 mEq/L (ref 135–145)
Total Bilirubin: 0.8 mg/dL (ref 0.2–1.2)
Total Protein: 7.1 g/dL (ref 6.0–8.3)

## 2016-05-13 LAB — CBC WITH DIFFERENTIAL/PLATELET
Basophils Absolute: 0 10*3/uL (ref 0.0–0.1)
Basophils Relative: 0.2 % (ref 0.0–3.0)
Eosinophils Absolute: 0 10*3/uL (ref 0.0–0.7)
Eosinophils Relative: 0.4 % (ref 0.0–5.0)
HCT: 35.2 % — ABNORMAL LOW (ref 36.0–46.0)
Hemoglobin: 11.8 g/dL — ABNORMAL LOW (ref 12.0–15.0)
Lymphocytes Relative: 6.8 % — ABNORMAL LOW (ref 12.0–46.0)
Lymphs Abs: 0.8 10*3/uL (ref 0.7–4.0)
MCHC: 33.5 g/dL (ref 30.0–36.0)
MCV: 89.1 fl (ref 78.0–100.0)
Monocytes Absolute: 0.7 10*3/uL (ref 0.1–1.0)
Monocytes Relative: 6.4 % (ref 3.0–12.0)
Neutro Abs: 9.6 10*3/uL — ABNORMAL HIGH (ref 1.4–7.7)
Neutrophils Relative %: 86.2 % — ABNORMAL HIGH (ref 43.0–77.0)
Platelets: 261 10*3/uL (ref 150.0–400.0)
RBC: 3.95 Mil/uL (ref 3.87–5.11)
RDW: 13.8 % (ref 11.5–15.5)
WBC: 11.1 10*3/uL — ABNORMAL HIGH (ref 4.0–10.5)

## 2016-05-13 LAB — LIPASE: Lipase: 21 U/L (ref 11.0–59.0)

## 2016-05-13 MED ORDER — GI COCKTAIL ~~LOC~~
30.0000 mL | Freq: Once | ORAL | Status: AC
  Administered 2016-05-13: 30 mL via ORAL

## 2016-05-13 MED ORDER — OMEPRAZOLE 20 MG PO CPDR: 20.0000 mg | DELAYED_RELEASE_CAPSULE | Freq: Two times a day (BID) | ORAL | 3 refills | Status: DC

## 2016-05-13 MED ORDER — SUCRALFATE 1 GM/10ML PO SUSP: 1.0000 g | Freq: Three times a day (TID) | ORAL | 0 refills | Status: DC

## 2016-05-13 NOTE — Progress Notes (Signed)
Pre visit review using our clinic review tool, if applicable. No additional management support is needed unless otherwise documented below in the visit note. 

## 2016-05-14 ENCOUNTER — Telehealth: Payer: Self-pay | Admitting: Family Medicine

## 2016-05-14 ENCOUNTER — Ambulatory Visit (HOSPITAL_COMMUNITY)
Admission: RE | Admit: 2016-05-14 | Discharge: 2016-05-14 | Disposition: A | Discharge status: Home / Self Care | Payer: Managed Care, Other (non HMO) | Source: Ambulatory Visit | Attending: Family Medicine | Admitting: Family Medicine

## 2016-05-14 ENCOUNTER — Encounter: Payer: Self-pay | Admitting: Gastroenterology

## 2016-05-14 ENCOUNTER — Ambulatory Visit (HOSPITAL_COMMUNITY): Payer: Managed Care, Other (non HMO)

## 2016-05-14 DIAGNOSIS — K824 Cholesterolosis of gallbladder: Secondary | ICD-10-CM | POA: Insufficient documentation

## 2016-05-14 DIAGNOSIS — R1013 Epigastric pain: Secondary | ICD-10-CM | POA: Diagnosis not present

## 2016-05-14 LAB — H. PYLORI BREATH TEST: H. pylori Breath Test: NOT DETECTED

## 2016-05-14 NOTE — Telephone Encounter (Signed)
[facility] to change Carafate to pill form (generic) brand.  Phone: [phone]  Thanks,  -LL

## 2016-05-14 NOTE — Telephone Encounter (Signed)
Spoke with pharmacy tech and gave verbal order to change medication to pill form.

## 2016-05-14 NOTE — Progress Notes (Signed)
Patient ID: Toronda Rostami, female  DOB: 1986-01-11  Age: 31 y.o.  MRN: [id number]  History of Present Illness:   Domonique Nakahara is a 31 y.o. female who presents for evaluation of abdominal pain. The pain is described as colicky, cramping and pressure-like, and is 4/10 in intensity. Pain is located in the RUQ, epigastric with radiation to back. Onset was a few days ago. Symptoms have been unchanged since. Aggravating factors: eating and NSAIDs.  Alleviating factors: none. Associated symptoms: anorexia and nausea. The patient denies arthralgias, chills, constipation, diarrhea, dysuria, fever, frequency, hematochezia, melena, myalgias, sweats and vomiting.   PMHx, SurgHx, SocialHx, Medications, and Allergies were reviewed in the Visit Navigator and updated as appropriate.   Medical History: Surgical History:  Past Medical History:  Diagnosis Date  . Congenitally short metatarsal    Bilateral   . Eczema   . Fibromyalgia   . Headache(784.0)   . Tonsillitis    Past Surgical History:  Procedure Laterality Date  . TONSILLECTOMY        Family History: Social History:  Family History  Problem Relation Age of Onset  . Cancer Maternal Grandmother     Breast   Social History  Substance Use Topics  . Smoking status: Never Smoker  . Smokeless tobacco: Never Used  . Alcohol use 0.0 oz/week     Comment: occassionally wine      Allergies:   Allergies  Allergen Reactions  . Pseudoephedrine     REACTION: face itched, rash  . Shrimp [Shellfish Allergy]   . Sulfa Antibiotics      Current Medications:   Prior to Admission medications   Medication Sig Start Date End Date Taking? Authorizing Provider  SUMAtriptan (IMITREX) 100 MG tablet Take 1 tablet (100 mg total) by mouth every 2 (two) hours as needed for migraine. May repeat in 2 hours if headache persists or recurs. 06/14/14  Yes Boswell, MD  mometasone (ELOCON) 0.1 % cream Apply to affected area for eczema once daily as needed -  small amount.     Historical Provider, MD     Review of Systems:   Constitutional: Negative for fever, chills.  HEENT: Negative for ear discharge, ear pain, mouth sores, sore throat, tinnitus and trouble swallowing.  No eye pain. LUNGS: Negative for cough, choking, chest tightness, shortness of breath and wheezing.   CV: Negative for chest pain, palpitations and leg swelling.  GU: Negative for dysuria, flank pain and difficulty urinating.  MSK:: Negative for unusual  joint swelling or pains, gait problem, neck pain and neck stiffness.   Vitals:   Vitals:   05/13/16 1335  BP: 110/68  Pulse: (!) 107  Temp: 98 F (36.7 C)  TempSrc: Oral  SpO2: 97%  Weight: 159 lb 6.4 oz (72.3 kg)     Body mass index is 27.36 kg/m.   Physical Exam:   General appearance: Alert, cooperative, appears stated age and no distress. Head: Normocephalic, without obvious abnormality, atraumatic. Eyes: Conjunctivae/corneas clear. PERRL, EOM's intact. Fundi benign. Ears: Normal TM's and external ear canals both ears. Nose: Nares normal. Septum midline. Mucosa normal. No drainage or sinus tenderness. Throat: Lips, mucosa, and tongue normal; teeth and gums normal. Lungs: Clear to auscultation bilaterally. Heart: Regular rate and rhythm, S1, S2 normal, no murmur, click, rub or gallop. Abdomen: TTP epigastrum and RUQ without rebound or gaurding.  Extremities: Extremities normal, atraumatic, no cyanosis or edema. Pulses: 2+ and symmetric. Skin: Skin color, texture, turgor normal. No rashes or  lesions. Neurologic: Alert and oriented X 3, normal strength and tone. Normal symmetric. reflexes. Normal coordination and gait.  Results for orders placed or performed in visit on 05/13/16  CBC with Differential/Platelet  Result Value Ref Range   WBC 11.1 (H) 4.0 - 10.5 K/uL   RBC 3.95 3.87 - 5.11 Mil/uL   Hemoglobin 11.8 (L) 12.0 - 15.0 g/dL   HCT 35.2 (L) 36.0 - 46.0 %   MCV 89.1 78.0 - 100.0 fl   MCHC 33.5  30.0 - 36.0 g/dL   RDW 13.8 11.5 - 15.5 %   Platelets 261.0 150.0 - 400.0 K/uL   Neutrophils Relative % 86.2 (H) 43.0 - 77.0 %   Lymphocytes Relative 6.8 (L) 12.0 - 46.0 %   Monocytes Relative 6.4 3.0 - 12.0 %   Eosinophils Relative 0.4 0.0 - 5.0 %   Basophils Relative 0.2 0.0 - 3.0 %   Neutro Abs 9.6 (H) 1.4 - 7.7 K/uL   Lymphs Abs 0.8 0.7 - 4.0 K/uL   Monocytes Absolute 0.7 0.1 - 1.0 K/uL   Eosinophils Absolute 0.0 0.0 - 0.7 K/uL   Basophils Absolute 0.0 0.0 - 0.1 K/uL  Comprehensive metabolic panel  Result Value Ref Range   Sodium 138 135 - 145 mEq/L   Potassium 3.7 3.5 - 5.1 mEq/L   Chloride 107 96 - 112 mEq/L   CO2 27 19 - 32 mEq/L   Glucose, Bld 161 (H) 70 - 99 mg/dL   BUN 12 6 - 23 mg/dL   Creatinine, Ser 0.83 0.40 - 1.20 mg/dL   Total Bilirubin 0.8 0.2 - 1.2 mg/dL   Alkaline Phosphatase 48 39 - 117 U/L   AST 15 0 - 37 U/L   ALT 16 0 - 35 U/L   Total Protein 7.1 6.0 - 8.3 g/dL   Albumin 4.3 3.5 - 5.2 g/dL   Calcium 9.3 8.4 - 10.5 mg/dL   GFR 104.13 >60.00 mL/min  Lipase  Result Value Ref Range   Lipase 21.0 11.0 - 59.0 U/L  H. pylori breath test  Result Value Ref Range   H. pylori Breath Test NOT DETECTED Not Detected  POCT urinalysis dipstick  Result Value Ref Range   Color, UA yellow    Clarity, UA clear    Glucose, UA neg    Bilirubin, UA neg    Ketones, UA 1+    Spec Grav, UA 1.025    Blood, UA neg    pH, UA 6.0    Protein, UA neg    Urobilinogen, UA 0.2    Nitrite, UA neg    Leukocytes, UA Negative Negative    EXAM: US ABDOMEN LIMITED - RIGHT UPPER QUADRANT  Gallbladder: There is a 5 mm polyp in the gallbladder. No stones, sludge, wall thickening, or pericholecystic fluid.  Common bile duct: Diameter: 3.7 mm  Liver: No focal lesion identified. Within normal limits in parenchymal echogenicity.  IMPRESSION: 1. 5 mm polyp in the gallbladder. 2. Incidental 1.5 cm right renal cyst.  Assessment and Plan:    Glessie was seen today for back  pain, hand pain and abdominal pain.  Diagnoses and all orders for this visit:  Epigastric pain -     CBC with Differential/Platelet -     Comprehensive metabolic panel -     Lipase -     H. pylori breath test  -     gi cocktail (maalox + Lidocaine) - IMPROVEMENT IN PAIN -     US Abdomen Limited  RUQ - NO ACUTE FINDINGS  -     omeprazole (PRILOSEC) 20 MG capsule; Take 1 capsule (20 mg total) by mouth 2 (two) times daily. -     sucralfate (CARAFATE) 1 GM/10ML suspension; Take 10 mLs (1 g total) by mouth 4 (four) times daily -  with meals and at bedtime. -     Ambulatory referral to Gastroenterology  Gallbladder polyp Renal cyst Comments: Incidental findings on RUQ [place].    Marland Kitchen Reviewed expectations re: course of current medical issues. . Discussed self-management of symptoms. . Outlined signs and symptoms indicating need for more acute intervention. . Patient verbalized understanding and all questions were answered. . See orders for this visit as documented in the electronic medical record. . Patient received an After Visit Summary.   Briscoe Deutscher, D.O.

## 2016-05-19 ENCOUNTER — Telehealth: Payer: Self-pay | Admitting: Family Medicine

## 2016-05-19 NOTE — Telephone Encounter (Signed)
Kim [facility] with [facility] [place], calling to report refill for omeprazole (PRILOSEC) 20 MG capsule [facility]  However, the patient has transferred script to their location.  She needs a verbal call in due to the system not recognizing the transfer.  Please call back with verbal refill at [phone].

## 2016-05-19 NOTE — Telephone Encounter (Addendum)
Spoke with patient and she wanted [facility] [address]. Called pharmacy and transferred RX.

## 2016-06-18 ENCOUNTER — Ambulatory Visit: Payer: Managed Care, Other (non HMO) | Admitting: Gastroenterology

## 2016-06-18 ENCOUNTER — Telehealth: Payer: Self-pay | Admitting: Family Medicine

## 2016-06-18 NOTE — Telephone Encounter (Signed)
Patient is calling to request Dr. Alcario Drought notes from her acute visit on February 8th 2018.   She would also like her x-ray results and lab results.   Please let me know if she can walk in for these requests.  Thank you,  -LL

## 2016-06-21 NOTE — Telephone Encounter (Signed)
Printed requested documents and placed at front desk for pick up.  Please let patient know she can come by and pick these up.  Thank you!

## 2016-06-22 NOTE — Telephone Encounter (Signed)
Thank you!   I've left her a message advising her it is ready for her to pick up.   Thank you,  -LL

## 2016-07-26 ENCOUNTER — Encounter: Payer: Self-pay | Admitting: Obstetrics & Gynecology

## 2016-07-26 ENCOUNTER — Ambulatory Visit (INDEPENDENT_AMBULATORY_CARE_PROVIDER_SITE_OTHER): Payer: Managed Care, Other (non HMO) | Admitting: Obstetrics & Gynecology

## 2016-07-26 VITALS — BP 103/59 | HR 72 | Resp 18 | Ht 64.0 in | Wt 159.0 lb

## 2016-07-26 DIAGNOSIS — Z01419 Encounter for gynecological examination (general) (routine) without abnormal findings: Secondary | ICD-10-CM | POA: Diagnosis not present

## 2016-07-26 MED ORDER — ACYCLOVIR 200 MG PO CAPS: ORAL_CAPSULE | ORAL | 5 refills | Status: DC

## 2016-07-26 NOTE — Progress Notes (Signed)
Subjective:    Kim Walsh is a 31 y.o. engaged P0  female who presents for an annual exam. The patient has no complaints today. The patient is sexually active. GYN screening history: last pap: was normal. The patient wears seatbelts: yes. The patient participates in regular exercise: yes. Has the patient ever been transfused or tattooed?: no. The patient reports that there is not domestic violence in her life.   Menstrual History: OB History    Gravida Para Term Preterm AB Living   0 0 0 0 0 0   SAB TAB Ectopic Multiple Live Births   0 0 0 0        Menarche age: 89 Patient's last menstrual period was 07/15/2016.    The following portions of the patient's history were reviewed and updated as appropriate: allergies, current medications, past family history, past medical history, past social history, past surgical history and problem list.  Review of Systems Pertinent items are noted in HPI.   FH- + breast cancer mGM, no gyn cancer, muncle has colon cancer, pcousin Using withdrawal for contraception, on PNVs Works at [employer], nursing   Objective:    BP (!) 103/59 (BP Location: Left Arm, Patient Position: Sitting, Cuff Size: Normal)   Pulse 72   Resp 18   Ht 5\' 4"  (1.626 m)   Wt 159 lb (72.1 kg)   LMP 07/15/2016   BMI 27.29 kg/m   General Appearance:    Alert, cooperative, no distress, appears stated age  Head:    Normocephalic, without obvious abnormality, atraumatic  Eyes:    PERRL, conjunctiva/corneas clear, EOM's intact, fundi    benign, both eyes  Ears:    Normal TM's and external ear canals, both ears  Nose:   Nares normal, septum midline, mucosa normal, no drainage    or sinus tenderness  Throat:   Lips, mucosa, and tongue normal; teeth and gums normal  Neck:   Supple, symmetrical, trachea midline, no adenopathy;    thyroid:  no enlargement/tenderness/nodules; no carotid   bruit or JVD  Back:     Symmetric, no curvature, ROM normal, no CVA tenderness  Lungs:     Clear  to auscultation bilaterally, respirations unlabored  Chest Wall:    No tenderness or deformity   Heart:    Regular rate and rhythm, S1 and S2 normal, no murmur, rub   or gallop  Breast Exam:    No tenderness, masses, or nipple abnormality  Abdomen:     Soft, non-tender, bowel sounds active all four quadrants,    no masses, no organomegaly  Genitalia:    Normal female without lesion, discharge or tenderness, NSSA, NT, mobile, no adnexal masses or tenderness     Extremities:   Extremities normal, atraumatic, no cyanosis or edema  Pulses:   2+ and symmetric all extremities  Skin:   Skin color, texture, turgor normal, no rashes or lesions  Lymph nodes:   Cervical, supraclavicular, and axillary nodes normal  Neurologic:   CNII-XII intact, normal strength, sensation and reflexes    throughout  .    Assessment:    Healthy female exam.    Plan:     Thin prep Pap smear.   Continue PNVs daily

## 2016-07-27 LAB — CYTOLOGY - PAP: DIAGNOSIS: NEGATIVE

## 2016-08-09 ENCOUNTER — Telehealth: Payer: Self-pay | Admitting: Family Medicine

## 2016-08-09 ENCOUNTER — Encounter: Payer: Self-pay | Admitting: Family Medicine

## 2016-08-09 NOTE — Telephone Encounter (Signed)
I deleted it , thanks for the heads up

## 2016-08-09 NOTE — Telephone Encounter (Signed)
Patient is stating she did not have Fibromyalgia, and it was only discussed with a provider 1 time but no treatment was done or course of action taken. Wanted to know if it could be removed from history.

## 2016-09-17 ENCOUNTER — Encounter: Payer: Self-pay | Admitting: Family Medicine

## 2016-09-17 ENCOUNTER — Ambulatory Visit (INDEPENDENT_AMBULATORY_CARE_PROVIDER_SITE_OTHER): Payer: Managed Care, Other (non HMO) | Admitting: Family Medicine

## 2016-09-17 VITALS — BP 120/72 | HR 120 | Temp 99.4°F | Wt 156.0 lb

## 2016-09-17 DIAGNOSIS — J069 Acute upper respiratory infection, unspecified: Secondary | ICD-10-CM | POA: Diagnosis not present

## 2016-09-17 DIAGNOSIS — B9789 Other viral agents as the cause of diseases classified elsewhere: Secondary | ICD-10-CM

## 2016-09-17 MED ORDER — BENZONATATE 100 MG PO CAPS: 100.0000 mg | ORAL_CAPSULE | Freq: Two times a day (BID) | ORAL | 0 refills | Status: DC | PRN

## 2016-09-17 NOTE — Patient Instructions (Addendum)
For nasal congestion you can use Afrin nasal spray for 3 days max, saline nasal spray (generic is fine for all). For cough you can try Delsym. Drink enough fluids to make your urine light yellow. For fever/chill/muscle aches you can take over the counter acetaminophen or ibuprofen.  Please come back in if you are not better in 5-7 days or if you develop wheezing, shortness of breath or persistent vomiting.   

## 2016-09-17 NOTE — Progress Notes (Signed)
Subjective:    Patient ID: Kim Walsh, female    DOB: 1985/05/23, 31 y.o.   MRN: [id number]  HPI This is a 31 yo female who presents today with sore throat x 4 days, dry cough, headache over eyes, no ear pain, some pressure. Some intermittent nasal drainage, some yellow-green. No SOB or wheeze. Fever today 101.6. Body aches. Works as an Therapist, sports in same day surgery. Off today (Friday) and this weekend. Has been taking Mucinex severe cold and flu with a little relief. Non smoker, no history asthma. Had tonsillectomy. Able to eat and drink, little appetite, so-so fluid intak\  Past Medical History:  Diagnosis Date  . Congenitally short metatarsal    Bilateral   . Eczema   . Headache(784.0)   . Tonsillitis    Past Surgical History:  Procedure Laterality Date  . TONSILLECTOMY     Family History  Problem Relation Age of Onset  . Breast cancer Maternal Grandmother   . Breast cancer Maternal Aunt   . Cancer Maternal Uncle        Non Hodgkins/Colon   Social History  Substance Use Topics  . Smoking status: Never Smoker  . Smokeless tobacco: Never Used  . Alcohol use 0.0 oz/week     Comment: occassionally wine    Review of Systems  Constitutional: Positive for fatigue and fever.  HENT: Positive for ear pain (pressure, no pain), postnasal drip, rhinorrhea and sore throat. Sinus pressure: intermittent.   Respiratory: Positive for cough (dry). Negative for shortness of breath and wheezing.   Cardiovascular: Positive for chest pain (with cough).  Gastrointestinal: Negative for nausea and vomiting.  Neurological: Positive for headaches.       Objective:   Physical Exam  Constitutional: She is oriented to person, place, and time. She appears well-developed and well-nourished. She appears ill. No distress.  HENT:  Head: Normocephalic and atraumatic.  Right Ear: Tympanic membrane, external ear and ear canal normal.  Left Ear: Tympanic membrane, external ear and ear canal normal.  Nose:  Mucosal edema and rhinorrhea present. Right sinus exhibits no maxillary sinus tenderness and no frontal sinus tenderness. Left sinus exhibits no maxillary sinus tenderness and no frontal sinus tenderness.  Mouth/Throat: Oropharynx is clear and moist. No oropharyngeal exudate, posterior oropharyngeal edema or posterior oropharyngeal erythema.  Tonsils absent.   Eyes: Conjunctivae are normal.  Neck: Normal range of motion. Neck supple.  Cardiovascular: Regular rhythm and normal heart sounds.   HR 110 on auscultation.   Pulmonary/Chest: Effort normal and breath sounds normal. No respiratory distress. She has no wheezes. She has no rales.  Lymphadenopathy:    She has no cervical adenopathy.  Neurological: She is alert and oriented to person, place, and time.  Skin: Skin is warm and dry. She is not diaphoretic.  Psychiatric: She has a normal mood and affect. Her behavior is normal. Judgment and thought content normal.  Vitals reviewed.     BP 120/72 (BP Location: Right Arm, Patient Position: Sitting, Cuff Size: Normal)   Pulse (!) 120   Temp 99.4 F (37.4 C) (Oral)   Wt 156 lb (70.8 kg)   LMP 09/08/2016   SpO2 97%   BMI 26.78 kg/m  Wt Readings from Last 3 Encounters:  09/17/16 156 lb (70.8 kg)  07/26/16 159 lb (72.1 kg)  05/13/16 159 lb 6.4 oz (72.3 kg)   Given ibuprofen 400 mg po in office for fever/throat pain    Assessment & Plan:  1. Viral URI with cough -  Provided written and verbal information regarding diagnosis and treatment. - She will let me know if she is not better in 3-4 days  - encouraged improved hydration  Patient Instructions  For nasal congestion you can use Afrin nasal spray for 3 days max, saline nasal spray (generic is fine for all). For cough you can try Delsym. Drink enough fluids to make your urine light yellow. For fever/chill/muscle aches you can take over the counter acetaminophen or ibuprofen.  Please come back in if you are not better in 5-7 days  or if you develop wheezing, shortness of breath or persistent vomiting.    Clarene Reamer, FNP-BC  [facility]  09/18/2016 9:21 AM

## 2016-09-18 ENCOUNTER — Encounter: Payer: Self-pay | Admitting: Family Medicine

## 2016-09-27 ENCOUNTER — Encounter: Payer: Self-pay | Admitting: Emergency Medicine

## 2016-09-27 ENCOUNTER — Telehealth: Payer: Self-pay | Admitting: Family Medicine

## 2016-09-27 NOTE — Telephone Encounter (Signed)
Patient is requesting a note for 09-17-16 verifying that she was sick and came to the doctor that day for her [place] due to being a part of a membership. The gym is requesting a note in order for her to remain in the membership. Call patient to advise if there are any questions. Okay to leave a detailed message.

## 2016-09-27 NOTE — Telephone Encounter (Signed)
Called and spoke with patient. Patient states that she would like the note faxed to her. I informed patient that I would fax note to number provided nothing further needed at this time.

## 2016-12-15 ENCOUNTER — Ambulatory Visit: Payer: Managed Care, Other (non HMO) | Admitting: Family Medicine

## 2016-12-15 ENCOUNTER — Telehealth: Payer: Self-pay | Admitting: Family Medicine

## 2016-12-15 NOTE — Telephone Encounter (Signed)
Aware F/u if no improvement

## 2016-12-15 NOTE — Telephone Encounter (Signed)
Patient Name: Kim Walsh DOB: 02/08/1986 Initial Comment Caller states she has a sore throat. Stuffy nose, started with a headache. No fever. She had some left over Amoxicillan and she took three doses of the medication. Nurse Assessment Nurse: Jimmye Norman, RN, Whitney Date/Time (Eastern Time): 12/15/2016 8:42:57 AM Confirm and document reason for call. If symptomatic, describe symptoms. ---Caller states she has a sore throat. Stuffy nose, started with a headache that began last Friday. denies fever. denies headache now. She had some left over Amoxicillin and she took three doses of the medication. feels like she is getting better. Does the patient have any new or worsening symptoms? ---Yes Will a triage be completed? ---Yes Related visit to physician within the last 2 weeks? ---No Does the PT have any chronic conditions? (i.e. diabetes, asthma, etc.) ---No Is the patient pregnant or possibly pregnant? (Ask all females between the ages of 38-55) ---No Is this a behavioral health or substance abuse call? ---No Guidelines Guideline Title Affirmed Question Affirmed Notes Common Cold Cold with no complications Final Disposition User Neuse Forest, RN, Whitney Disagree/Comply: Comply

## 2016-12-15 NOTE — Telephone Encounter (Signed)
PLEASE NOTE: All timestamps contained within this report are represented as [place] Standard Time. CONFIDENTIALTY NOTICE: This fax transmission is intended only for the addressee. It contains information that is legally privileged, confidential or otherwise protected from use or disclosure. If you are not the intended recipient, you are strictly prohibited from reviewing, disclosing, copying using or disseminating any of this information or taking any action in reliance on or regarding this information. If you have received this fax in error, please notify us immediately by telephone so that we can arrange for its return to [place]. Phone: [phone], Toll-Free: [phone], Fax: [phone] Page: 1 of 2 Call Id: [id number] [place] Patient Name: Kim Walsh Gender: Female DOB: 06-Aug-1985 Age: 31 Y 0 M 0 D Return Phone Number: [phone] (Primary) City/State/Zip: [place] [address] Client Garrett Day - Client Client Site [facility] Physician Tower, Roque Lias - MD Who Is Calling Patient / Member / Family / Caregiver Call Type Triage / Clinical Caller Name Tajha Sammarco Relationship To Patient Self Return Phone Number [phone] (Primary) Chief Complaint Sore Throat Reason for Call Symptomatic / Request for [place] states she has a sore throat. Stuffy nose, started with a headache. No fever. She had some left over Amoxicillan and she took three doses of the medication. Appointment Disposition EMR Appointment Not Necessary Info pasted into Epic Yes Nurse Assessment Nurse: Jimmye Norman, RN, Whitney Date/Time (Eastern Time): 12/15/2016 8:42:57 AM Confirm and document reason for call. If symptomatic, describe symptoms. ---Caller states she has a sore throat. Stuffy nose, started with a headache that began last  Friday. denies fever. denies headache now. She had some left over Amoxicillin and she took three doses of the medication. feels like she is getting better. Does the PT have any chronic conditions? (i.e. diabetes, asthma, etc.) ---No Is the patient pregnant or possibly pregnant? (Ask all females between the ages of 85-55) ---No Guidelines Guideline Title Affirmed Question Common Cold Cold with no complications Disp. Time Eilene Ghazi Time) Disposition Final User 12/15/2016 8:50:38 AM Home Care Yes Jimmye Norman, RN, Whitney Care Advice Given Per Guideline HOME CARE: You should be able to treat this at home. REASSURANCE AND EDUCATION: * It sounds like an uncomplicated cold that we can treat at home. FOR A STUFFY NOSE - USE NASAL WASHES: * Introduction: Saline (salt water) nasal irrigation (nasal wash) is an effective and simple home remedy for treating stuffy nose and sinus congestion. The nose can be irrigated by pouring, spraying, or squirting salt water into the nose and then letting it run back out. * Oxymetazoline Nasal Drops (Afrin): Available over-the-counter. Clean out the nose before using. Spray each nostril once, wait one minute for absorption, and then spray a second time. CALL BACK IF: * You become short of breath * You become worse. * Runny nose lasts over 10 days PLEASE NOTE: All timestamps contained within this report are represented as [place] Standard Time. CONFIDENTIALTY NOTICE: This fax transmission is intended only for the addressee. It contains information that is legally privileged, confidential or otherwise protected from use or disclosure. If you are not the intended recipient, you are strictly prohibited from reviewing, disclosing, copying using or disseminating any of this information or taking any action in reliance on or regarding this information. If you have received this fax in error, please notify us immediately by telephone so that we can  arrange for its return to  [place]. Phone: [phone], Toll-Free: [phone], Fax: [phone] Page: 2 of 2 Call Id: [id number]

## 2017-01-11 ENCOUNTER — Encounter: Payer: Self-pay | Admitting: Sports Medicine

## 2017-01-11 ENCOUNTER — Ambulatory Visit (INDEPENDENT_AMBULATORY_CARE_PROVIDER_SITE_OTHER): Payer: Managed Care, Other (non HMO) | Admitting: Sports Medicine

## 2017-01-11 VITALS — BP 100/70 | HR 60 | Ht 64.0 in | Wt 157.6 lb

## 2017-01-11 DIAGNOSIS — M545 Low back pain, unspecified: Secondary | ICD-10-CM

## 2017-01-11 DIAGNOSIS — M546 Pain in thoracic spine: Secondary | ICD-10-CM

## 2017-01-11 LAB — POCT URINALYSIS DIPSTICK
BILIRUBIN UA: NEGATIVE
Blood, UA: NEGATIVE
GLUCOSE UA: NEGATIVE
KETONES UA: NEGATIVE
LEUKOCYTES UA: NEGATIVE
NITRITE UA: NEGATIVE
Protein, UA: 15
Spec Grav, UA: 1.025 (ref 1.010–1.025)
Urobilinogen, UA: 0.2 E.U./dL
pH, UA: 6 (ref 5.0–8.0)

## 2017-01-11 LAB — COMPREHENSIVE METABOLIC PANEL
ALBUMIN: 4.5 g/dL (ref 3.5–5.2)
ALT: 17 U/L (ref 0–35)
AST: 15 U/L (ref 0–37)
Alkaline Phosphatase: 54 U/L (ref 39–117)
BUN: 12 mg/dL (ref 6–23)
CALCIUM: 9.6 mg/dL (ref 8.4–10.5)
CO2: 27 meq/L (ref 19–32)
CREATININE: 0.87 mg/dL (ref 0.40–1.20)
Chloride: 105 mEq/L (ref 96–112)
GFR: 98.18 mL/min (ref >60.00)
Glucose, Bld: 90 mg/dL (ref 70–99)
Potassium: 3.7 mEq/L (ref 3.5–5.1)
Sodium: 139 mEq/L (ref 135–145)
TOTAL PROTEIN: 7.2 g/dL (ref 6.0–8.3)
Total Bilirubin: 0.8 mg/dL (ref 0.2–1.2)

## 2017-01-11 LAB — LIPASE: Lipase: 17 U/L (ref 11.0–59.0)

## 2017-01-11 LAB — POCT URINE PREGNANCY: PREG TEST UR: NEGATIVE

## 2017-01-11 MED ORDER — CYCLOBENZAPRINE HCL 10 MG PO TABS: 10.0000 mg | ORAL_TABLET | Freq: Three times a day (TID) | ORAL | 1 refills | Status: DC | PRN

## 2017-01-11 MED ORDER — METHYLPREDNISOLONE 4 MG PO TBPK: ORAL_TABLET | ORAL | 0 refills | Status: DC

## 2017-01-11 NOTE — Progress Notes (Signed)
OFFICE VISIT NOTE [name]. Tavien Chestnut, [facility]  Kim Walsh - 31 y.o. female MRN [id number]  Date of birth: Apr 10, 1985  Visit Date: 01/11/2017  PCP: [name], MD   Referred by: Tower, [name], MD  Thalia Bloodgood PT, LAT, ATC acting as scribe for Dr. Paulla Fore.  SUBJECTIVE:   Chief Complaint  Patient presents with  . New Patient (Initial Visit)    Back pain   HPI: As below and per problem based documentation when appropriate.  [name] is a new pt presenting today w/ c/o back pain.  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  She states that her back was sore while working and then got much worse when she got home but does not recall any specific MOI.  She notes that her pain is a lot better today than it was yesterday.  She states that she called out of work today.  Pt locates her pain to her TL junction and rates it as a 4-5/10.  She describes the pain currently as localized, tender and throbbing vs yesterday when it was sharp and shooting.  She rates the pain as an 8-9/10 at it's worst over the past 24-48 hours.  Pt states that she took IBU (600 mg) and used heat/ice and that these things did help somewhat.    Worsened with turning, bending and any type of activity that compresses her spine.  She states that she injured her back previously at work in conjunction w/ a knee injury.  Pt denies any radiating s/s including N/T.    Review of Systems  Constitutional: Negative for chills, fever and weight loss.  HENT: Negative.   Eyes: Negative.   Respiratory: Negative for shortness of breath and wheezing.   Cardiovascular: Negative for chest pain and palpitations.  Gastrointestinal: Positive for heartburn. Negative for abdominal pain and nausea.  Genitourinary: Negative.   Musculoskeletal: Positive for back pain. Negative  for falls.  Neurological: Positive for headaches. Negative for dizziness and tingling.  Endo/Heme/Allergies: Does not bruise/bleed easily.  Psychiatric/Behavioral: Negative for depression. The patient is not nervous/anxious and does not have insomnia.     Otherwise per HPI.  HISTORY & PERTINENT PRIOR DATA:  No specialty comments available. She reports that she has never smoked. She has never used smokeless tobacco. No results for input(s): HGBA1C, LABURIC in the last 8760 hours. Allergies reviewed per EMR Prior to Admission medications   Medication Sig Start Date End Date Taking? Authorizing Provider  acyclovir (ZOVIRAX) 200 MG capsule TAKE 1 CAPSULE (200 MG TOTAL) BY MOUTH 3 (THREE) TIMES DAILY. FOR 5 DAYS AS NEEDED FOR COLD SORE. 07/26/16  Yes Dove, Myra C, MD  acyclovir (ZOVIRAX) 5 % cream Apply 1 application topically 5 (five) times daily. 09/07/10  Yes Ria Bush, MD  mometasone (ELOCON) 0.1 % cream Apply to affected area for eczema once daily as needed - small amount.    Yes [provider]  omeprazole (PRILOSEC) 20 MG capsule Take 1 capsule (20 mg total) by mouth 2 (two) times daily. 05/13/16  Yes Briscoe Deutscher, DO  sucralfate (CARAFATE) 1 GM/10ML suspension Take 10 mLs (1 g total) by mouth 4 (four) times daily -  with meals and at bedtime. 05/13/16  Yes Briscoe Deutscher, DO  SUMAtriptan (IMITREX) 100 MG tablet Take 1 tablet (100  mg total) by mouth every 2 (two) hours as needed for migraine. May repeat in 2 hours if headache persists or recurs. 06/14/14  Yes Tower, [name], MD  cyclobenzaprine (FLEXERIL) 10 MG tablet Take 1 tablet (10 mg total) by mouth 3 (three) times daily as needed for muscle spasms. 01/11/17   Gerda Diss, DO  methylPREDNISolone (MEDROL DOSEPAK) 4 MG TBPK tablet Take by mouth as directed. Take 6 tablets on the first day prescribed then as directed. 01/11/17   Gerda Diss, DO   Patient Active Problem List   Diagnosis Date Noted  . Lower thoracic back  pain 01/11/2017  . 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  . Plantar fasciitis, bilateral 03/13/2015  . 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  . Skin lesion 11/08/2014  . Left upper quadrant pain 11/08/2014  . Abdominal pain, LUQ (left upper quadrant) 11/07/2014  . Encounter for routine gynecological examination 06/14/2014  . Screen for STD (sexually transmitted disease) 06/14/2014  . Routine general medical examination at a health care facility 03/06/2012  . Foreign body 04/15/2011  . Atopic dermatitis 09/07/2010  . 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 Change in hearing 07/16/2010  . Vision changes 07/16/2010  . BREAST MASS, RIGHT 02/20/2009  . 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 SICKLE-CELL TRAIT 08/02/2006  . ECZEMA 08/02/2006  . Migraine 08/02/2006  . ANA POSITIVE 08/02/2006   Past Medical History:  Diagnosis Date  . Congenitally short metatarsal    Bilateral   . Eczema   . Headache(784.0)   . Tonsillitis    Family History  Problem Relation Age of Onset  . Breast cancer Maternal Grandmother   . Breast cancer Maternal Aunt   . Cancer Maternal Uncle        Non Hodgkins/Colon   Past Surgical History:  Procedure Laterality Date  . TONSILLECTOMY     Social History   Occupational History  . 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 Smoking status: Never Smoker  . Smokeless tobacco: Never Used  . Alcohol use 0.0 oz/week     Comment: occassionally wine  . Drug use: No  . Sexual activity: Yes    Partners: Male    Birth control/ protection: Condom    OBJECTIVE:  VS:  HT:5\' 4"  (162.6 cm)   WT:157 lb 9.6 oz (71.5 kg)  BMI:27.04    BP:100/70  HR:60bpm  TEMP: ( )  RESP:98 % EXAM: Findings:  WDWN, NAD, Non-toxic appearing Alert & appropriately interactive Not depressed or anxious appearing No increased work of breathing. Pupils are equal. EOM intact without nystagmus No clubbing or cyanosis of the extremities appreciated No significant skin changes appreciated over the the examined  area. No significant pretibial edema. Sensation intact light touch along the thoracic rib cage with a generalized hyperesthesia.  Lower extremity sensation is is grossly intact.  back: Marked muscle spasms with leftward scoliotic curvature secondary to spasms.  No focal bony midline pain.  Limited flexion and extension.  She has pain with straight leg raise however no focal radicular pain.  Moderate degree of tenderness over bilateral CVA.  Positive Lloyd sign.  No abdominal pain with palpation.  Abdomen is soft and nontender.  She does have a small amount of discomfort with suprapubic tenderness but this is minimal.  Negative Murphy's.     RADIOLOGY: US Abdomen Limited RUQ CLINICAL DATA:  Right upper quadrant pain and nausea.  EXAM: US ABDOMEN LIMITED - RIGHT UPPER QUADRANT  COMPARISON:  None.  FINDINGS: Gallbladder:  There is a 5 mm polyp in the gallbladder. No stones, sludge, wall thickening, or pericholecystic fluid.  Common bile duct:  Diameter: 3.7 mm  Liver:  No focal lesion identified. Within normal limits in parenchymal echogenicity.  IMPRESSION: 1. 5 mm polyp in the gallbladder. 2. Incidental 1.5 cm right renal cyst.  Electronically Signed   By: Dorise Bullion III M.D   On: 05/14/2016 08:59  ASSESSMENT & PLAN:     ICD-10-CM   1. Lumbar back pain M54.5 methylPREDNISolone (MEDROL DOSEPAK) 4 MG TBPK tablet    cyclobenzaprine (FLEXERIL) 10 MG tablet    POCT Urinalysis Dipstick    Comprehensive metabolic panel    Lipase    POCT urine pregnancy  2. Lower thoracic back pain M54.6 methylPREDNISolone (MEDROL DOSEPAK) 4 MG TBPK tablet    cyclobenzaprine (FLEXERIL) 10 MG tablet   ================================================================= Lower thoracic back pain No significant eliciting event that would likely be causative.  She does have marked paraspinal muscle spasms and significantly limited range of motion.  We will have her begin  a steroid Dosepak  to help reduce the inflammatory effect as well as muscle relaxers.  Plan for short-term follow-up in 6 days and consider osteopathic manipulation at that time if indicated or further diagnostic imaging if persistent ongoing symptoms.  Reviewed red flags of potential serious infection but at this time no evidence of acute infectious process.  ================================================================= Future Appointments Date Time Provider [place]  01/17/2017 10:00 AM Gerda Diss, DO [facility] None    Follow-up: Return in about 6 days (around 01/17/2017).   CMA/ATC served as Education administrator during this visit. History, Physical, and Plan performed by medical provider. Documentation and orders reviewed and attested to.      Teresa Coombs, [facility] Physician

## 2017-01-11 NOTE — Assessment & Plan Note (Signed)
No significant eliciting event that would likely be causative.  She does have marked paraspinal muscle spasms and significantly limited range of motion.  We will have her begin  a steroid Dosepak to help reduce the inflammatory effect as well as muscle relaxers.  Plan for short-term follow-up in 6 days and consider osteopathic manipulation at that time if indicated or further diagnostic imaging if persistent ongoing symptoms.  Reviewed red flags of potential serious infection but at this time no evidence of acute infectious process.

## 2017-01-17 ENCOUNTER — Ambulatory Visit (INDEPENDENT_AMBULATORY_CARE_PROVIDER_SITE_OTHER): Payer: Managed Care, Other (non HMO) | Admitting: Sports Medicine

## 2017-01-17 ENCOUNTER — Encounter: Payer: Self-pay | Admitting: Sports Medicine

## 2017-01-17 VITALS — BP 102/68 | HR 87 | Ht 64.0 in | Wt 164.4 lb

## 2017-01-17 DIAGNOSIS — M9903 Segmental and somatic dysfunction of lumbar region: Secondary | ICD-10-CM | POA: Diagnosis not present

## 2017-01-17 DIAGNOSIS — M546 Pain in thoracic spine: Secondary | ICD-10-CM | POA: Diagnosis not present

## 2017-01-17 DIAGNOSIS — M545 Low back pain, unspecified: Secondary | ICD-10-CM

## 2017-01-17 DIAGNOSIS — M9904 Segmental and somatic dysfunction of sacral region: Secondary | ICD-10-CM

## 2017-01-17 DIAGNOSIS — M9908 Segmental and somatic dysfunction of rib cage: Secondary | ICD-10-CM

## 2017-01-17 DIAGNOSIS — M9902 Segmental and somatic dysfunction of thoracic region: Secondary | ICD-10-CM | POA: Diagnosis not present

## 2017-01-17 DIAGNOSIS — M9905 Segmental and somatic dysfunction of pelvic region: Secondary | ICD-10-CM | POA: Diagnosis not present

## 2017-01-17 NOTE — Patient Instructions (Signed)

## 2017-01-17 NOTE — Progress Notes (Signed)
OFFICE VISIT NOTE Kim Walsh, [place] [facility] [place]  Kim Walsh - 31 y.o. female MRN [id number]  Date of birth: June 26, 1985  Visit Date: 01/17/2017  PCP: [name], MD   Referred by: Tower, [name], MD  Thalia Bloodgood PT, LAT, ATC acting as scribe for Dr. Paulla Fore.  SUBJECTIVE:   Chief Complaint  Patient presents with  . Follow-up    low back pain   HPI: As below and per problem based documentation when appropriate.  [name] is an established pt presenting today for f/u of her LBP.  Pt was last seen on 01/11/17 and was prescribed a Medrol dosepack and cyclobenzapine.  She states that she finished the steroid dose pack and hasn't taken the cyclobenzapine for 2 days.  She ended up going to see her occupational health doctor at work and that doctor put her on Skelaxine (800mg  tid prn).  She notes that she was feeling slightly constipated which led her to stop the Flexeril temporarily.  She notes that she currently has 2-3/10 burning pain in the central TL spine but no radiating pain.  She states that she returned to work on Sunday and did much better but also notes that it's still bothering her.  She notes that her occupational health doctor gave her some stretches to do which she plans to start today.    Review of Systems  Musculoskeletal: Positive for back pain.  All other systems reviewed and are negative.   Otherwise per HPI.  HISTORY & PERTINENT PRIOR DATA:  No specialty comments available. She reports that she has never smoked. She has never used smokeless tobacco. No results for input(s): HGBA1C, LABURIC in the last 8760 hours. Allergies reviewed per EMR Prior to Admission medications   Medication Sig Start Date End Date Taking? Authorizing Provider  acyclovir (ZOVIRAX) 200 MG capsule TAKE 1 CAPSULE (200 MG TOTAL) BY MOUTH 3 (THREE) TIMES DAILY. FOR 5 DAYS AS NEEDED FOR COLD SORE. 07/26/16  Yes Dove, Myra C, MD    acyclovir (ZOVIRAX) 5 % cream Apply 1 application topically 5 (five) times daily. 09/07/10  Yes Ria Bush, MD  cyclobenzaprine (FLEXERIL) 10 MG tablet Take 1 tablet (10 mg total) by mouth 3 (three) times daily as needed for muscle spasms. 01/11/17  Yes Gerda Diss, DO  metaxalone (SKELAXIN) 800 MG tablet Take 800 mg by mouth 3 (three) times daily.   Yes [provider]  mometasone (ELOCON) 0.1 % cream Apply to affected area for eczema once daily as needed - small amount.    Yes [provider]  omeprazole (PRILOSEC) 20 MG capsule Take 1 capsule (20 mg total) by mouth 2 (two) times daily. 05/13/16  Yes Briscoe Deutscher, DO  sucralfate (CARAFATE) 1 GM/10ML suspension Take 10 mLs (1 g total) by mouth 4 (four) times daily -  with meals and at bedtime. 05/13/16  Yes Briscoe Deutscher, DO  SUMAtriptan (IMITREX) 100 MG tablet Take 1 tablet (100 mg total) by mouth every 2 (two) hours as needed for migraine. May repeat in 2 hours if headache persists or recurs. 06/14/14  Yes Tower, [name], MD   Patient Active Problem List   Diagnosis Date Noted  . Lower thoracic back pain 01/11/2017  . 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  . Plantar fasciitis, bilateral 03/13/2015  . 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  . Skin lesion 11/08/2014  . Left upper quadrant pain 11/08/2014  . Abdominal pain, LUQ (left  upper quadrant) 11/07/2014  . Encounter for routine gynecological examination 06/14/2014  . Screen for STD (sexually transmitted disease) 06/14/2014  . Routine general medical examination at a health care facility 03/06/2012  . Foreign body 04/15/2011  . Atopic dermatitis 09/07/2010  . 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 Change in hearing 07/16/2010  . Vision changes 07/16/2010  . BREAST MASS, RIGHT 02/20/2009  . 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 SICKLE-CELL TRAIT 08/02/2006  . ECZEMA 08/02/2006  . Migraine 08/02/2006  . ANA POSITIVE 08/02/2006   Past Medical History:  Diagnosis Date  . Congenitally short metatarsal     Bilateral   . Eczema   . Headache(784.0)   . Tonsillitis    Family History  Problem Relation Age of Onset  . Breast cancer Maternal Grandmother   . Breast cancer Maternal Aunt   . Cancer Maternal Uncle        Non Hodgkins/Colon   Past Surgical History:  Procedure Laterality Date  . TONSILLECTOMY     Social History   Occupational History  . CNA at [employer]  . Smoking status: Never Smoker  . Smokeless tobacco: Never Used  . Alcohol use 0.0 oz/week     Comment: occassionally wine  . Drug use: No  . Sexual activity: Yes    Partners: Male    Birth control/ protection: Condom    OBJECTIVE:  VS:  HT:5\' 4"  (162.6 cm)   WT:164 lb 6.4 oz (74.6 kg)  BMI:28.21    BP:102/68  HR:87bpm  TEMP: ( )  RESP:99 % EXAM: Findings:  Adult female, no acute distress.  Alert and appropriate.  She has overall improved thoracic and lumbar range of motion.  Thoracolumbar junction is most exquisitely sore.  No overlying skin changes other than a small amount of xerosis.  No radicular symptoms.  Otherwise osteopathic findings as per procedure note.    RADIOLOGY: US Abdomen Limited RUQ CLINICAL DATA:  Right upper quadrant pain and nausea.  EXAM: US ABDOMEN LIMITED - RIGHT UPPER QUADRANT  COMPARISON:  None.  FINDINGS: Gallbladder:  There is a 5 mm polyp in the gallbladder. No stones, sludge, wall thickening, or pericholecystic fluid.  Common bile duct:  Diameter: 3.7 mm  Liver:  No focal lesion identified. Within normal limits in parenchymal echogenicity.  IMPRESSION: 1. 5 mm polyp in the gallbladder. 2. Incidental 1.5 cm right renal cyst.  Electronically Signed   By: Dorise Bullion III M.D   On: 05/14/2016 08:59  ASSESSMENT & PLAN:     ICD-10-CM   1. Lumbar back pain M54.5 OSTEOPATHIC MANIPULATION TREATMENT  2. Lower thoracic back pain M54.6 OSTEOPATHIC MANIPULATION TREATMENT  3. Segmental and somatic dysfunction of thoracic  region M99.02 OSTEOPATHIC MANIPULATION TREATMENT  4. Segmental and somatic dysfunction of lumbar region M99.03 OSTEOPATHIC MANIPULATION TREATMENT  5. Segmental and somatic dysfunction of sacral region M99.04 OSTEOPATHIC MANIPULATION TREATMENT  6. Segmental and somatic dysfunction of pelvic region M99.05 OSTEOPATHIC MANIPULATION TREATMENT  7. Segmental and somatic dysfunction of rib cage M99.08 OSTEOPATHIC MANIPULATION TREATMENT   ================================================================= Lower thoracic back pain TL junction pain likely secondary to hip flexor tightness due to poor biomechanics.  Continue with therapeutic exercises previously prescribed including AAOS spine conditioning program and had and hip hinging exercises with foundations training.  Follow-up in 4 weeks to ensure clinical improvement but she responded well to osteopathic manipulation and I suspect should continue to improve.  Okay to discontinue muscle relaxers as able.  PROCEDURE NOTE :  OSTEOPATHIC MANIPULATION The decision today to treat with Osteopathic Manipulative Therapy (OMT) was based on physical exam findings. Verbal consent was obtained after after explanation of risks, benefits and potential side effects, including acute pain flare, post manipulation soreness and need for repeat treatments.   After verbal consent was obtained manipulation was performed as below:            Regions treated: Per examined regions as below and associated billing codes          Techniques used: Muscle Energy, MFR, HVLA and ART The patient tolerated the treatment well and reported Improved symptoms following treatment today. Patient was given medications, exercises, stretches and lifestyle modifications per AVS and verbally.     OSTEOPATHIC/STRUCTURAL EXAM FINDINGS:    T4 through T6 neutral rotated left, side bent right  T8 through T10 neutral rotated right, side bent left  Posterior ribs 7 on the left  L3 FRS right  Right  anterior innominate  Left on left sacral torsion  ================================================================= Patient Instructions  Also check out "[employer]" which is a program developed by Dr. Minerva Ends.   There are links to a couple of his YouTube Videos below and I would like to see performing one of his videos 5-6 days per week.    A good intro video is: "Independence from Pain 7-minute Video" - [email or web address]   His more advanced video is: "Powerful Posture and Pain Relief: 12 minutes of Foundation Training" - https://youtu.be/[email or web address]  Do not try to attempt this entire video when first beginning.    Try breaking of each exercise that he goes into shorter segments.  Otherwise if they perform an exercise for 45 seconds, start with 15 seconds and rest and then resume when they begin the new activity.    If you work your way up to doing this 12 minute video, I expect you will see significant improvements in your pain.  If you enjoy his videos and would like to find out more you can look on his website: [email or web address]  He has a workout streaming option as well as a DVD set available for purchase.  Amazon has the best price for his DVDs.    ================================================================= Future Appointments Date Time Provider [place]  02/14/2017 9:00 AM Gerda Diss, DO [facility] None    Follow-up: Return in about 4 weeks (around 02/14/2017).   CMA/ATC served as Education administrator during this visit. History, Physical, and Plan performed by medical provider. Documentation and orders reviewed and attested to.      Teresa Coombs, [facility] Physician

## 2017-01-17 NOTE — Assessment & Plan Note (Signed)
TL junction pain likely secondary to hip flexor tightness due to poor biomechanics.  Continue with therapeutic exercises previously prescribed including AAOS spine conditioning program and had and hip hinging exercises with foundations training.  Follow-up in 4 weeks to ensure clinical improvement but she responded well to osteopathic manipulation and I suspect should continue to improve.  Okay to discontinue muscle relaxers as able.

## 2017-01-17 NOTE — Procedures (Signed)
PROCEDURE NOTE : OSTEOPATHIC MANIPULATION The decision today to treat with Osteopathic Manipulative Therapy (OMT) was based on physical exam findings. Verbal consent was obtained after after explanation of risks, benefits and potential side effects, including acute pain flare, post manipulation soreness and need for repeat treatments.   After verbal consent was obtained manipulation was performed as below:            Regions treated: Per examined regions as below and associated billing codes          Techniques used: Muscle Energy, MFR, HVLA and ART The patient tolerated the treatment well and reported Improved symptoms following treatment today. Patient was given medications, exercises, stretches and lifestyle modifications per AVS and verbally.     OSTEOPATHIC/STRUCTURAL EXAM FINDINGS:    T4 through T6 neutral rotated left, side bent right  T8 through T10 neutral rotated right, side bent left  Posterior ribs 7 on the left  L3 FRS right  Right anterior innominate  Left on left sacral torsion

## 2017-02-11 ENCOUNTER — Telehealth: Payer: Self-pay | Admitting: Sports Medicine

## 2017-02-11 NOTE — Telephone Encounter (Signed)
Noted.  Thanks Marcene Brawn.

## 2017-02-11 NOTE — Telephone Encounter (Signed)
Patient called in reference to wanting to pay for FMLA paperwork. Informed patient this time it would be waived. Per Marcene Brawn.

## 2017-02-14 ENCOUNTER — Ambulatory Visit: Payer: Managed Care, Other (non HMO) | Admitting: Sports Medicine

## 2017-02-18 ENCOUNTER — Telehealth: Payer: Self-pay

## 2017-02-18 NOTE — Telephone Encounter (Signed)
I can't find it in problems or past problems or history... Where is it?  thanks

## 2017-02-18 NOTE — Telephone Encounter (Signed)
Copied from [place] [id number]. Topic: General - Other >> Feb 18, 2017 11:19 AM Kim Walsh, NT wrote: Reason for CRM: Patient was seen at [facility] the other week and her print out states that she has a history of fibromyalgia. Patient wants  to see if Dr. [name] can remove this from her record? She states she has never been treated for fibromyalgia and has never taken any medication for this and would like it removed from her record.

## 2017-02-18 NOTE — Telephone Encounter (Signed)
I don't see this diagnosis in her chart either, I will route to [facility] the RN lead

## 2017-02-21 NOTE — Telephone Encounter (Signed)
Extensively reviewed chart and history and did not find any evidence of fibromyalgia dx.    Spoke with patient to inform her and she explains that she only "thought" that it was on her list and wasn't sure.  She thinks me for clarifying this information and is glad we don't have it listed.  Question resolved.

## 2017-06-01 ENCOUNTER — Other Ambulatory Visit: Payer: Self-pay | Admitting: Family Medicine

## 2017-06-01 ENCOUNTER — Other Ambulatory Visit (HOSPITAL_COMMUNITY): Payer: Self-pay | Admitting: Gastroenterology

## 2017-06-01 ENCOUNTER — Other Ambulatory Visit: Payer: Self-pay | Admitting: Internal Medicine

## 2017-06-01 DIAGNOSIS — R1013 Epigastric pain: Secondary | ICD-10-CM

## 2017-06-01 DIAGNOSIS — R1084 Generalized abdominal pain: Secondary | ICD-10-CM

## 2017-06-01 NOTE — Telephone Encounter (Signed)
Left VM requesting pt to call the office back CRM created 

## 2017-06-01 NOTE — Telephone Encounter (Signed)
Copied from [place]. Topic: Inquiry >> Jun 01, 2017 11:35 AM Oliver Pila B wrote: Reason for CRM: pt called and states that she was out of town and went to the ER on Sunday apparently had a gallbladder issue and was prescribed medication; pt is now seeing if she can get something prescribed for her nausea; asked pt if she has come in for a hos f/u but pt has not, pt states physician she saw recommended her to go to a GI specialists. Contact pt to advise

## 2017-06-01 NOTE — Telephone Encounter (Signed)
Please send for records from the ED Does she need a referral ?  Let me know if she is acturally vomiting or having pain   I pended zofran to send-please confirm pharmacy and send it  Thanks

## 2017-06-02 ENCOUNTER — Encounter: Payer: Self-pay | Admitting: Family Medicine

## 2017-06-03 MED ORDER — ONDANSETRON 4 MG PO TBDP: 4.0000 mg | ORAL_TABLET | Freq: Three times a day (TID) | ORAL | 0 refills | Status: DC | PRN

## 2017-06-03 NOTE — Telephone Encounter (Signed)
Spoke with the patient. She reports she was referred to GI and saw Dr. Martinique for epigastric abdominal pain. She has seen him and has ordered NM Hepto w/Eject for 06/08/17 (per chart). She could not recall the medication he gave her. She continues to have nausea and would like the zofran called in to [place], please.

## 2017-06-03 NOTE — Telephone Encounter (Signed)
I sent it  Please let her know

## 2017-06-03 NOTE — Telephone Encounter (Signed)
Left VM letting pt know Rx was sent 

## 2017-06-06 ENCOUNTER — Encounter: Payer: Self-pay | Admitting: Radiology

## 2017-06-08 ENCOUNTER — Encounter (HOSPITAL_COMMUNITY)
Admission: RE | Admit: 2017-06-08 | Discharge: 2017-06-08 | Disposition: A | Discharge status: Home / Self Care | Payer: Managed Care, Other (non HMO) | Source: Ambulatory Visit | Attending: Gastroenterology | Admitting: Gastroenterology

## 2017-06-08 DIAGNOSIS — R1013 Epigastric pain: Secondary | ICD-10-CM | POA: Insufficient documentation

## 2017-06-08 MED ORDER — TECHNETIUM TC 99M MEBROFENIN IV KIT
5.3000 | PACK | Freq: Once | INTRAVENOUS | Status: AC | PRN
  Administered 2017-06-08: 5.3 via INTRAVENOUS

## 2017-07-28 ENCOUNTER — Ambulatory Visit: Payer: Managed Care, Other (non HMO) | Admitting: Obstetrics & Gynecology

## 2017-08-02 ENCOUNTER — Ambulatory Visit: Payer: Managed Care, Other (non HMO) | Admitting: Obstetrics & Gynecology

## 2017-08-05 ENCOUNTER — Ambulatory Visit (INDEPENDENT_AMBULATORY_CARE_PROVIDER_SITE_OTHER): Payer: Managed Care, Other (non HMO) | Admitting: Obstetrics and Gynecology

## 2017-08-05 ENCOUNTER — Encounter: Payer: Self-pay | Admitting: Obstetrics and Gynecology

## 2017-08-05 VITALS — BP 121/79 | HR 72 | Wt 151.0 lb

## 2017-08-05 DIAGNOSIS — B373 Candidiasis of vulva and vagina: Secondary | ICD-10-CM

## 2017-08-05 DIAGNOSIS — N946 Dysmenorrhea, unspecified: Secondary | ICD-10-CM

## 2017-08-05 DIAGNOSIS — Z01411 Encounter for gynecological examination (general) (routine) with abnormal findings: Secondary | ICD-10-CM

## 2017-08-05 DIAGNOSIS — Z124 Encounter for screening for malignant neoplasm of cervix: Secondary | ICD-10-CM | POA: Diagnosis not present

## 2017-08-05 DIAGNOSIS — B3731 Acute candidiasis of vulva and vagina: Secondary | ICD-10-CM

## 2017-08-05 DIAGNOSIS — N920 Excessive and frequent menstruation with regular cycle: Secondary | ICD-10-CM | POA: Diagnosis not present

## 2017-08-05 DIAGNOSIS — Z1151 Encounter for screening for human papillomavirus (HPV): Secondary | ICD-10-CM

## 2017-08-05 DIAGNOSIS — Z113 Encounter for screening for infections with a predominantly sexual mode of transmission: Secondary | ICD-10-CM | POA: Diagnosis not present

## 2017-08-05 DIAGNOSIS — Z01419 Encounter for gynecological examination (general) (routine) without abnormal findings: Secondary | ICD-10-CM

## 2017-08-05 MED ORDER — FLUCONAZOLE 150 MG PO TABS: 150.0000 mg | ORAL_TABLET | Freq: Once | ORAL | 0 refills | Status: AC

## 2017-08-05 NOTE — Progress Notes (Signed)
Discuss cycles

## 2017-08-05 NOTE — Progress Notes (Signed)
Obstetrics and Gynecology Annual Patient Evaluation  Appointment Date: 08/05/2017  OBGYN Clinic: [facility]  Primary Care Provider: Loura Pardon A   Chief Complaint:  Chief Complaint  Patient presents with  . Gynecologic Exam  worsened periods   History of Present Illness: Kim Walsh is a 32 y.o. African-American G0 (Patient's last menstrual period was 07/28/2017.), seen for the above chief complaint. Her past medical history is significant for migraines w/o aura, gastriris  See below for menstrual history  No breast s/s, fevers, chills, chest pain, SOB, nausea, vomiting, abdominal pain, dysuria, hematuria, vaginal itching, dyspareunia, diarrhea, constipation, blood in BMs  Review of Systems:  as noted in the History of Present Illness.  Patient Active Problem List   Diagnosis Date Noted  . Lower thoracic back pain 01/11/2017  . 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  . Plantar fasciitis, bilateral 03/13/2015  . 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  . Skin lesion 11/08/2014  . Left upper quadrant pain 11/08/2014  . Abdominal pain, LUQ (left upper quadrant) 11/07/2014  . Encounter for routine gynecological examination 06/14/2014  . Screen for STD (sexually transmitted disease) 06/14/2014  . Routine general medical examination at a health care facility 03/06/2012  . Foreign body 04/15/2011  . Atopic dermatitis 09/07/2010  . 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 Change in hearing 07/16/2010  . Vision changes 07/16/2010  . BREAST MASS, RIGHT 02/20/2009  . 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 SICKLE-CELL TRAIT 08/02/2006  . ECZEMA 08/02/2006  . Migraine 08/02/2006  . ANA POSITIVE 08/02/2006    Past Medical History:  Past Medical History:  Diagnosis Date  . Congenitally short metatarsal    Bilateral   . Eczema   . Headache(784.0)   . Tonsillitis     Past Surgical History:  Past Surgical History:  Procedure Laterality Date  . TONSILLECTOMY      Past Obstetrical History:  OB  History  Gravida Para Term Preterm AB Living  0 0 0 0 0 0  SAB TAB Ectopic Multiple Live Births  0 0 0 0      Past Gynecological History: As per HPI. Periods: qmonth, regular. Previously had only been about 3d and not heavy or painful but now about 1wk and more heavy and painful. Doesn't appear to be impacting QoL History of Pap Smear(s): Yes.   Last pap 2018, which was NILM History of STI(s): Yes, in the remote past She is currently using nothing for contraception and never has used anything in the past  Social History:  Social History   Socioeconomic History  . Marital status: Single    Spouse name: Not on file  . Number of children: 0  . Years of education: Not on file  . Highest education level: Not on file  Occupational History  . Occupation: CNA at [employer]  . Financial resource strain: Not on file  . Food insecurity:    Worry: Not on file    Inability: Not on file  . Transportation needs:    Medical: Not on file    Non-medical: Not on file  Tobacco Use  . Smoking status: Never Smoker  . Smokeless tobacco: Never Used  Substance and Sexual Activity  . Alcohol use: Yes    Alcohol/week: 0.0 oz    Comment: occassionally wine  . Drug use: No  . Sexual activity: Yes    Partners: Male    Birth control/protection: Condom  Lifestyle  . Physical activity:    Days per week: Not on  file    Minutes per session: Not on file  . Stress: Not on file  Relationships  . Social connections:    Talks on phone: Not on file    Gets together: Not on file    Attends religious service: Not on file    Active member of club or organization: Not on file    Attends meetings of clubs or organizations: Not on file    Relationship status: Not on file  . Intimate partner violence:    Fear of current or ex partner: Not on file    Emotionally abused: Not on file    Physically abused: Not on file    Forced sexual activity: Not on file  Other Topics Concern  . Not  on file  Social History Narrative  . Not on file    Family History:  Family History  Problem Relation Age of Onset  . Breast cancer Maternal Grandmother  48s  . Cancer Maternal Uncle        Non Hodgkins/Colon     Medications Tennille Montelongo had no medications administered during this visit. Current Outpatient Medications  Medication Sig Dispense Refill  . acyclovir (ZOVIRAX) 200 MG capsule TAKE 1 CAPSULE (200 MG TOTAL) BY MOUTH 3 (THREE) TIMES DAILY. FOR 5 DAYS AS NEEDED FOR COLD SORE. 15 capsule 5  . mometasone (ELOCON) 0.1 % cream Apply to affected area for eczema once daily as needed - small amount.     Marland Kitchen omeprazole (PRILOSEC) 20 MG capsule Take 1 capsule (20 mg total) by mouth 2 (two) times daily. 60 capsule 3  . SUMAtriptan (IMITREX) 100 MG tablet Take 1 tablet (100 mg total) by mouth every 2 (two) hours as needed for migraine. May repeat in 2 hours if headache persists or recurs. 10 tablet 11  . acyclovir (ZOVIRAX) 5 % cream Apply 1 application topically 5 (five) times daily. (Patient not taking: Reported on 08/05/2017) 15 g 0  . cyclobenzaprine (FLEXERIL) 10 MG tablet Take 1 tablet (10 mg total) by mouth 3 (three) times daily as needed for muscle spasms. (Patient not taking: Reported on 08/05/2017) 30 tablet 1  . fluconazole (DIFLUCAN) 150 MG tablet Take 1 tablet (150 mg total) by mouth once for 1 dose. Can take additional dose three days later if symptoms persist 2 tablet 0  . metaxalone (SKELAXIN) 800 MG tablet Take 800 mg by mouth 3 (three) times daily.    . ondansetron (ZOFRAN-ODT) 4 MG disintegrating tablet Take 1 tablet (4 mg total) by mouth every 8 (eight) hours as needed for nausea or vomiting. (Patient not taking: Reported on 08/05/2017) 20 tablet 0  . sucralfate (CARAFATE) 1 GM/10ML suspension Take 10 mLs (1 g total) by mouth 4 (four) times daily -  with meals and at bedtime. (Patient not taking: Reported on 08/05/2017) 420 mL 0   No current facility-administered medications for  this visit.     Allergies Pseudoephedrine; Shrimp [shellfish allergy]; and Sulfa antibiotics   Physical Exam:  BP 121/79   Pulse 72   Wt 151 lb (68.5 kg)   LMP 07/28/2017   BMI 25.92 kg/m  Body mass index is 25.92 kg/m. Weight last year: 158 lbs General appearance: Well nourished, well developed female in no acute distress.  Neck:  Supple, normal appearance, and no thyromegaly  Cardiovascular: normal s1 and s2.  No murmurs, rubs or gallops. Respiratory:  Clear to auscultation bilateral. Normal respiratory effort Abdomen: positive bowel sounds and no masses, hernias; diffusely non tender  to palpation, non distended Breasts: breasts appear normal, no suspicious masses, no skin or nipple changes or axillary nodes, and normal palpation. Neuro/Psych:  Normal mood and affect.  Skin:  Warm and dry.  Lymphatic:  No inguinal lymphadenopathy.   Pelvic exam: is not limited by body habitus EGBUS: within normal limits, Vagina: within normal limits and with no blood in the vault, +cottage cheese like discharge in the vault, Cervix: normal appearing cervix without tenderness, discharge or lesions. Uterus:  nonenlarged and non tender and Adnexa:  normal adnexa and no mass, fullness, tenderness Rectovaginal: deferred  Laboratory: none  Radiology: none  Assessment: pt doing well  Plan: 1. Cervical cancer screening Pt desires pap screening. Since age 62, will do hpv testing, too. - Cytology - PAP  2. Encounter for annual routine gynecological examination Routine care. Diflucan for asymptomatic yeast infection. Pt okay with STI screening - Cytology - PAP - RPR - HIV antibody (with reflex) - Hepatitis B Surface AntiGEN - Hepatitis C Antibody  3. Menorrhagia with regular cycle Her and partner aren't trying to get pregnant but are okay if that does happen and pt doesn't want to be on anything for [facility]. I told her that as long as it isn't impacting her QoL that not necessarily anything that  she has to do. She states that she may difficulty with remembering pills qday and I went over all other options such as the ring, depo and LARC. If pt decides she's like to do something, she will let us know and we can phone in something for her.   4. Dysmenorrhea See above.   RTC 1 year  Aletha Halim, Brooke Bonito MD Attending [facility] [employer] [facility] [employer])

## 2017-08-06 LAB — HEPATITIS B SURFACE ANTIGEN: Hepatitis B Surface Ag: NEGATIVE

## 2017-08-06 LAB — RPR: RPR Ser Ql: NONREACTIVE

## 2017-08-06 LAB — HEPATITIS C ANTIBODY

## 2017-08-06 LAB — HIV ANTIBODY (ROUTINE TESTING W REFLEX): HIV Screen 4th Generation wRfx: NONREACTIVE

## 2017-08-07 ENCOUNTER — Encounter: Payer: Self-pay | Admitting: Obstetrics and Gynecology

## 2017-08-09 LAB — CYTOLOGY - PAP
Bacterial vaginitis: NEGATIVE
CHLAMYDIA, DNA PROBE: NEGATIVE
Candida vaginitis: NEGATIVE
Diagnosis: NEGATIVE
HPV (WINDOPATH): NOT DETECTED
Neisseria Gonorrhea: NEGATIVE
TRICH (WINDOWPATH): NEGATIVE

## 2018-05-16 ENCOUNTER — Ambulatory Visit (INDEPENDENT_AMBULATORY_CARE_PROVIDER_SITE_OTHER): Payer: Managed Care, Other (non HMO) | Admitting: Sports Medicine

## 2018-05-16 ENCOUNTER — Encounter: Payer: Self-pay | Admitting: Sports Medicine

## 2018-05-16 VITALS — BP 102/74 | HR 81 | Ht 64.0 in | Wt 162.0 lb

## 2018-05-16 DIAGNOSIS — M24552 Contracture, left hip: Secondary | ICD-10-CM | POA: Diagnosis not present

## 2018-05-16 DIAGNOSIS — M9902 Segmental and somatic dysfunction of thoracic region: Secondary | ICD-10-CM | POA: Diagnosis not present

## 2018-05-16 DIAGNOSIS — M9908 Segmental and somatic dysfunction of rib cage: Secondary | ICD-10-CM

## 2018-05-16 DIAGNOSIS — M9904 Segmental and somatic dysfunction of sacral region: Secondary | ICD-10-CM

## 2018-05-16 DIAGNOSIS — M9903 Segmental and somatic dysfunction of lumbar region: Secondary | ICD-10-CM | POA: Diagnosis not present

## 2018-05-16 DIAGNOSIS — M9905 Segmental and somatic dysfunction of pelvic region: Secondary | ICD-10-CM

## 2018-05-16 DIAGNOSIS — M9906 Segmental and somatic dysfunction of lower extremity: Secondary | ICD-10-CM

## 2018-05-16 NOTE — Patient Instructions (Addendum)

## 2018-05-16 NOTE — Progress Notes (Signed)
Kim Walsh. Kim Walsh, [name] [phone]  Kim Walsh - 33 y.o. female MRN [id number]  Date of birth: 12-06-1985  Visit Date: May 16, 2018  PCP: [name], MD   Referred by: Tower, [name], MD  SUBJECTIVE:  Chief Complaint  Patient presents with  . Left Hip - Initial Assessment    XR L-spine 07/02/2004.     HPI: Patient is here for left hip pain.  This is been present for 1 to 2 weeks and has been worsening since beginning working out as preparation for an upcoming wedding in September.  She did have a forceful abduction mechanism as well as hyperflexion of her hips with working out and has some deep anterior lateral pain of the left hip greater than right.  This does occasionally awaken her.  Symptoms are worse as the day progresses and goes from sit to stand.  Side-lying is more painful than walking spine.  She has tried a Medrol Dosepak cyclobenzaprine and Skelaxin with mild improvement.  REVIEW OF SYSTEMS: Nighttime disturbances although slightly improving. Denies fevers, chills, recent weight gain or weight loss.  No night sweats.  Pt denies any change in bowel or bladder habits, muscle weakness, numbness or falls associated with this pain.  HISTORY:  Prior history reviewed and updated per electronic medical record.  Patient Active Problem List   Diagnosis Date Noted  . Lower thoracic back pain 01/11/2017  . 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  . Plantar fasciitis, bilateral 03/13/2015  . 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  . Skin lesion 11/08/2014  . Left upper quadrant pain 11/08/2014  . Abdominal pain, LUQ (left upper quadrant) 11/07/2014  . Encounter for routine gynecological examination 06/14/2014  . Screen for STD (sexually transmitted disease) 06/14/2014  . Routine general medical examination at a health care facility 03/06/2012  . Foreign body 04/15/2011  . Atopic dermatitis 09/07/2010  . 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 Change in hearing 07/16/2010  . Vision changes 07/16/2010  . BREAST MASS, RIGHT 02/20/2009    Qualifier: Diagnosis of  By: [name] MD, [name]    . 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 SICKLE-CELL TRAIT 08/02/2006    Qualifier: Diagnosis of  By: [name] CMA, [name]     . ECZEMA 08/02/2006    Qualifier: History of  By: [name] CMA, [name]     . Migraine 08/02/2006    Qualifier: Diagnosis of  By: [name] CMA, [name]     . ANA POSITIVE 08/02/2006    Qualifier: History of  By: [name] CMA, [name]      Social History   Occupational History  . Occupation: CNA at [employer]  Tobacco Use  . Smoking status: Never Smoker  . Smokeless tobacco: Never Used  Substance and Sexual Activity  . Alcohol use: Yes    Alcohol/week: 0.0 standard drinks    Comment: occassionally wine  . Drug use: No  . Sexual activity: Yes    Partners: Male    Birth control/protection: Condom   Social History   Social History Narrative  . Not on file    OBJECTIVE:  VS:  HT:5\' 4"  (162.6 cm)   WT:162 lb (73.5 kg)  BMI:27.79    BP:102/74  HR:81bpm  TEMP: ( )  RESP:97 %   PHYSICAL EXAM: Adult female. No acute distress.  Alert and appropriate. Good cervical  and lumbar range of motion although there are functional limitations. Negative Spurling's compression test and Lhermitte's compression test.   Upper extremity lower extremity strength is 5/5 in all myotomes. Normal sensation Significant anterior chain dominant posture.    ASSESSMENT:   1. Left hip flexor tightness   2. Somatic dysfunction of thoracic region   3. Somatic dysfunction of lumbar region   4. Somatic dysfunction of rib cage region   5. Somatic dysfunction of pelvis region   6. Somatic dysfunction of sacral region   7. Somatic dysfunction of lower extremity     PROCEDURES:  PROCEDURE NOTE: OSTEOPATHIC MANIPULATION   The decision today to  treat with Osteopathic Manipulative Therapy (OMT) was based on physical exam findings. Verbal consent was obtained following a discussion with the patient regarding the of risks, benefits and potential side effects, including an acute pain flare,post manipulation soreness and need for repeat treatments.  NONE  Manipulation was performed as below:  [facility] Treated & Osteopathic Exam Findings   THORACIC SPINE:   T6 - 10 Neutral, rotated LEFT, sidebent RIGHT RIBS:   Rib 7 Right  Posterior LUMBAR SPINE:   L4 FRS LEFT (Flexed, Rotated & Sidebent) PELVIS:   Left psoas spasm Left anterior innonimate SACRUM:   R on R sacral torsion LOWER EXTREMITIES:   Left hip joint capsule tenderness point.  Externally rotated hip on the left.    OMT Techniques Used:  HVLA muscle energy myofascial release HVLA - Long Lever    The patient tolerated the treatment well and reported Improved symptoms following treatment today. Patient was given medications, exercises, stretches and lifestyle modifications per AVS and verbally.       PLAN:  Pertinent additional documentation may be included in corresponding procedure notes, imaging studies, problem based documentation and patient instructions.  No problem-specific Assessment & Plan notes found for this encounter.  Links to [employer] provided today per Patient Instructions.  These exercises were developed by Minerva Ends, DC with a strong emphasis on core neuromuscular reducation and postural realignment through body-weight exercises.  Discussed the underlying features of tight hip flexors leading to crouched, fetal like position that results in spinal column compression.  Including lumbar hyperflexion with hypermobility, thoracic flexion with restrictive rotation and cervical lordosis reversal.   Osteopathic manipulation was performed today based on physical exam findings.  Patient was counseled on the purpose and expected outcome of  osteopathic manipulation and understands that a single treatment may not provide permanent long lasting relief.  They understand that home therapeutic exercises are critical part of the healing/treatment process and will continue with self treatment between now and their next visit as outlined.  The patient understands that the frequency of visits is meant to provide a stimulus to promote the body's own ability to heal and is not meant to be the sole means for improvement in their symptoms.  Activity modifications and the importance of avoiding exacerbating activities (limiting pain to no more than a 4 / 10 during or following activity) recommended and discussed.  Discussed red flag symptoms that warrant earlier emergent evaluation and patient voices understanding.   At follow up will plan to consider: repeat osteopathic manipulation  Return in about 2 weeks (around 05/30/2018).          Gerda Diss, [facility] Physician

## 2018-07-31 ENCOUNTER — Telehealth: Payer: Self-pay | Admitting: Family Medicine

## 2018-07-31 NOTE — Telephone Encounter (Signed)
Best number [phone] Pt called needing to get a refill on acyclovir  [facility] [address]  Offered pt virtual appointment pt declined she has short hours at work   Please advise when called in

## 2018-07-31 NOTE — Telephone Encounter (Signed)
See prev note. Pt hasn't had an office visit here since 2016, please advise

## 2018-07-31 NOTE — Telephone Encounter (Signed)
I spoke to patient.  Patient didn't want to schedule a virtual visit.  She said her hours at work have been cut and she didn't want to wait until June.  Patient said she's going to try and find a new doctor.

## 2018-07-31 NOTE — Telephone Encounter (Signed)
Med not filled since pt didn't want to schedule an appt now or wait until June

## 2018-07-31 NOTE — Telephone Encounter (Signed)
Please refill times one  Schedule a f/u appt in June if possible (hoping we will be open more normally)  Thanks

## 2018-11-24 ENCOUNTER — Telehealth: Payer: Self-pay | Admitting: Family Medicine

## 2018-11-24 ENCOUNTER — Encounter: Payer: Self-pay | Admitting: Family Medicine

## 2018-11-24 ENCOUNTER — Ambulatory Visit (INDEPENDENT_AMBULATORY_CARE_PROVIDER_SITE_OTHER): Payer: Managed Care, Other (non HMO) | Admitting: Family Medicine

## 2018-11-24 VITALS — BP 114/68 | Wt 149.0 lb

## 2018-11-24 DIAGNOSIS — G43709 Chronic migraine without aura, not intractable, without status migrainosus: Secondary | ICD-10-CM | POA: Diagnosis not present

## 2018-11-24 DIAGNOSIS — R220 Localized swelling, mass and lump, head: Secondary | ICD-10-CM

## 2018-11-24 DIAGNOSIS — N920 Excessive and frequent menstruation with regular cycle: Secondary | ICD-10-CM | POA: Diagnosis not present

## 2018-11-24 DIAGNOSIS — D573 Sickle-cell trait: Secondary | ICD-10-CM | POA: Diagnosis not present

## 2018-11-24 DIAGNOSIS — R1013 Epigastric pain: Secondary | ICD-10-CM

## 2018-11-24 DIAGNOSIS — L309 Dermatitis, unspecified: Secondary | ICD-10-CM | POA: Diagnosis not present

## 2018-11-24 MED ORDER — SUMATRIPTAN SUCCINATE 100 MG PO TABS: 100.0000 mg | ORAL_TABLET | ORAL | 11 refills | Status: DC | PRN

## 2018-11-24 MED ORDER — ACYCLOVIR 200 MG PO CAPS: 400.0000 mg | ORAL_CAPSULE | Freq: Four times a day (QID) | ORAL | 5 refills | Status: DC | PRN

## 2018-11-24 MED ORDER — MOMETASONE FUROATE 0.1 % EX CREA: TOPICAL_CREAM | Freq: Every day | CUTANEOUS | 3 refills | Status: AC

## 2018-11-24 NOTE — Patient Instructions (Addendum)
Please get a flu shot in the fall   Work on healthy habits -diet/exercise/fluid intake and regular bed and wake times to prevent headaches  Limiting caffeine is also a good idea I refilled imitrex  If headaches worsen please follow up   Use mometasone cream for eczema  Take 2000 iu of vitamin D daily if not outdoors much   Follow up with gyn as planned for heavy menses and yeast issues  Have them draw labs if you can (they should be visible in epic)   Continue acyclovir for lip swelling- 400 mg per dose is ok

## 2018-11-24 NOTE — Assessment & Plan Note (Signed)
Lately worse in neck  Refilled mometasone cream -helpful Avoiding hot water/detergents

## 2018-11-24 NOTE — Progress Notes (Signed)
Subjective:    Patient ID: Kim Walsh, female    DOB: 06/11/1985, 33 y.o.   MRN: [id number]  HPI  Virtual Visit via Video Note  I connected with Kim Walsh on 11/24/18 at 10:00 AM EDT by a video enabled telemedicine application and verified that I am speaking with the correct person using two identifiers.  Location: Patient: home Provider: office    I discussed the limitations of evaluation and management by telemedicine and the availability of in person appointments. The patient expressed understanding and agreed to proceed.  HPI  Here for f/u of chronic medical problems   Trying to plan a wedding during covid- sept  Small ceremony outdoors with masks  Weight  This am 149  Wt Readings from Last 3 Encounters:  05/16/18 162 lb (73.5 kg)  08/05/17 151 lb (68.5 kg)  01/17/17 164 lb 6.4 oz (74.6 kg)  has been working out with trainer with [employer]  Also eating healthy  Loosing weight  Less fried foods /more vegetables   Pap 5/19 nl  Has some issues with yeast  Has h/o fibroids  She just set up gyn appt  She has heavy menses but does not want to take OC  Menses are shorter   Vitals  bp 114/68  H/o migraine with aura imitrex for rescue  zofran for nausea   Worse around her menses  Got worse for a while- and then better -? Stress related   Eczema  Mometasone cream  Worse on neck lately   H/o sickle cell trait (we do not have labs)  Her father told her  Not too tired (for her) - she always has to work on sleep Mildly anemic in the past    PHQ 2:-no depression  No issues  0    Had a ccy 8/19-doing a lot better since then /not nauseated  She had a large gb polyp  GERD and nausea are much better   Gyn visit 5/19 with pap and std screening   Recurrent lip swelling Takes acyclovir and needs refill  Was told by Dr Lindaann Pascal 400 to 500 mg per dose and this helps much more  Unsure what causes this   Patient Active Problem List   Diagnosis Date Noted  .  Heavy menses 11/24/2018  . Lip swelling 11/24/2018  . Lower thoracic back pain 01/11/2017  . 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  . Plantar fasciitis, bilateral 03/13/2015  . 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  . Encounter for routine gynecological examination 06/14/2014  . Screen for STD (sexually transmitted disease) 06/14/2014  . Routine general medical examination at a health care facility 03/06/2012  . Foreign body 04/15/2011  . Atopic dermatitis 09/07/2010  . 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 Change in hearing 07/16/2010  . 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 SICKLE-CELL TRAIT 08/02/2006  . Eczema 08/02/2006  . Migraine 08/02/2006  . ANA POSITIVE 08/02/2006   Past Medical History:  Diagnosis Date  . Congenitally short metatarsal    Bilateral   . Eczema   . Gastritis   . Headache(784.0)   . Tonsillitis    Past Surgical History:  Procedure Laterality Date  . TONSILLECTOMY     Social History   Tobacco Use  . Smoking status: Never Smoker  . Smokeless tobacco: Never Used  Substance Use Topics  . Alcohol use: Yes    Alcohol/week: 0.0 standard drinks    Comment: occassionally wine  . Drug use: No   Family History  Problem Relation Age of Onset  .  Breast cancer Maternal Grandmother   . Breast cancer Maternal Aunt   . Cancer Maternal Uncle        Non Hodgkins/Colon   Allergies  Allergen Reactions  . Pseudoephedrine     REACTION: face itched, rash  . Shrimp [Shellfish Allergy]   . Sulfa Antibiotics    Current Outpatient Medications on File Prior to Visit  Medication Sig Dispense Refill  . acyclovir (ZOVIRAX) 5 % cream Apply 1 application topically 5 (five) times daily. 15 g 0  . cholecalciferol (VITAMIN D) 25 MCG (1000 UT) tablet Take 2,000 Units by mouth daily.    . cyclobenzaprine (FLEXERIL) 10 MG tablet Take by mouth.    . metaxalone (SKELAXIN) 800 MG tablet Take 800 mg by mouth 3 (three) times daily.    Marland Kitchen omeprazole (PRILOSEC) 20 MG capsule Take 1 capsule (20 mg total) by mouth 2 (two) times  daily. 60 capsule 3  . ondansetron (ZOFRAN-ODT) 4 MG disintegrating tablet Take 1 tablet (4 mg total) by mouth every 8 (eight) hours as needed for nausea or vomiting. 20 tablet 0   No current facility-administered medications on file prior to visit.    Review of Systems  HENT:       Recurrent lip swelling  Genitourinary:       Heavy menses  Neurological: Positive for headaches.    Review of Systems  HENT:       Recurrent lip swelling  Genitourinary:       Heavy menses  Neurological: Positive for headaches.       Objective:   Physical Exam  Patient appears well, in no distress Weight is baseline (normal) No facial swelling or asymmetry Normal voice-not hoarse and no slurred speech No obvious tremor or mobility impairment Moving neck and UEs normally Able to hear the call well  No cough or shortness of breath during interview  Talkative and mentally sharp with no cognitive changes No skin changes on face or neck , no rash or pallor (could not see neck well in light provided) Affect is normal        Assessment & Plan:   Problem List Items Addressed This Visit      Cardiovascular and Mediastinum   Migraine - Primary    Worse around menses -still uses imitrex for rescue but has not needed zofran  Working out and loosing weight as well  Enc good headache habits (regular sleep/fluid/meals and limit caffeine)  imitrex refilled  Update if worse or change She does not want to go on hormonal contraception at this time      Relevant Medications   cyclobenzaprine (FLEXERIL) 10 MG tablet   SUMAtriptan (IMITREX) 100 MG tablet     Musculoskeletal and Integument   Eczema    Lately worse in neck  Refilled mometasone cream -helpful Avoiding hot water/detergents        Other   SICKLE-CELL TRAIT    Per hx-pt is not sure  Last hb was 11.8  Plans to re check at gyn soon Never had any athletic issues       Heavy menses    Pt has gyn visit upcoming  She declines  hormonal tx for this-is tolerating  Plans to get labs there ([place]) including cbc  Continues to work out and loose weight      Lip swelling    Recurrent  Has responded to acyclovir (400- 500 mg per dose)  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

## 2018-11-24 NOTE — Telephone Encounter (Signed)
Patient stated that she only received refills for  Her eczema cream and her Sumatriptan    She is needing refills sent for all of her medication  [facility] [place]

## 2018-11-24 NOTE — Assessment & Plan Note (Signed)
Worse around menses -still uses imitrex for rescue but has not needed zofran  Working out and loosing weight as well  Enc good headache habits (regular sleep/fluid/meals and limit caffeine)  imitrex refilled  Update if worse or change She does not want to go on hormonal contraception at this time

## 2018-11-24 NOTE — Assessment & Plan Note (Signed)
Per hx-pt is not sure  Last hb was 11.8  Plans to re check at gyn soon Never had any athletic issues

## 2018-11-24 NOTE — Telephone Encounter (Signed)
She has 2 muscle relaxers on her list - skelaxin and flexeril (neither were from me as last fill)  Which one does she want?

## 2018-11-24 NOTE — Assessment & Plan Note (Signed)
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

## 2018-11-24 NOTE — Assessment & Plan Note (Signed)
Pt has gyn visit upcoming  She declines hormonal tx for this-is tolerating  Plans to get labs there ([place]) including cbc  Continues to work out and loose weight

## 2018-11-24 NOTE — Telephone Encounter (Signed)
Patient states that she takes Omeprazole as needed and it was originally prescribed by a provider at [place] because we did not have openings on that day. Patient states she would like to have Flexeril refilled to use as needed for back spasms.

## 2018-11-24 NOTE — Telephone Encounter (Signed)
I sent oral acyclovir-did they not get it ? Omeprazole on her list looked like it was not from me   What else does she need refilled?  Thanks

## 2018-11-24 NOTE — Telephone Encounter (Signed)
Pt is requesting muscle relaxer and omeprazole

## 2018-11-26 MED ORDER — CYCLOBENZAPRINE HCL 10 MG PO TABS: 10.0000 mg | ORAL_TABLET | Freq: Three times a day (TID) | ORAL | 3 refills | Status: DC | PRN

## 2018-11-26 MED ORDER — OMEPRAZOLE 20 MG PO CPDR: 20.0000 mg | DELAYED_RELEASE_CAPSULE | Freq: Two times a day (BID) | ORAL | 11 refills | Status: DC

## 2018-11-28 ENCOUNTER — Ambulatory Visit (INDEPENDENT_AMBULATORY_CARE_PROVIDER_SITE_OTHER): Payer: Managed Care, Other (non HMO) | Admitting: Obstetrics and Gynecology

## 2018-11-28 ENCOUNTER — Encounter: Payer: Self-pay | Admitting: Obstetrics and Gynecology

## 2018-11-28 ENCOUNTER — Other Ambulatory Visit: Payer: Self-pay

## 2018-11-28 VITALS — BP 106/68 | HR 72 | Wt 152.6 lb

## 2018-11-28 DIAGNOSIS — Z01419 Encounter for gynecological examination (general) (routine) without abnormal findings: Secondary | ICD-10-CM | POA: Diagnosis not present

## 2018-11-28 DIAGNOSIS — B3731 Acute candidiasis of vulva and vagina: Secondary | ICD-10-CM

## 2018-11-28 DIAGNOSIS — N920 Excessive and frequent menstruation with regular cycle: Secondary | ICD-10-CM

## 2018-11-28 DIAGNOSIS — B373 Candidiasis of vulva and vagina: Secondary | ICD-10-CM

## 2018-11-28 DIAGNOSIS — Z0000 Encounter for general adult medical examination without abnormal findings: Secondary | ICD-10-CM

## 2018-11-28 DIAGNOSIS — D219 Benign neoplasm of connective and other soft tissue, unspecified: Secondary | ICD-10-CM | POA: Insufficient documentation

## 2018-11-28 NOTE — Progress Notes (Signed)
Obstetrics and Gynecology Annual Patient Evaluation  Appointment Date: 11/28/2018  OBGYN Clinic: [facility]  Primary Care Provider: Loura Pardon A  Referring Provider: [name], MD  Chief Complaint:  Chief Complaint  Patient presents with  . Gynecologic Exam    History of Present Illness: Kim Walsh is a 33 y.o. African-American [id number] (Patient's last menstrual period was 11/09/2018.), seen for the above chief complaint.    Recurrent yeast: ongoing for more than a year. Feels like it occurs qmonth. Only constant s/s is discharge and sometimes itching. Cream or PO meds didn't really seem to help. Does use a feminine wash  Basic labs: would like this for work  Breast cancer screening: FHx of breast cancer in several relatives.   Menorrhagia: stable, see below. Fibroids noted at [facility] last year. No intermenstrual bleeding.   +discharge and vaginal itching.   No breast s/s, abdominal pain, diarrhea, constipation.   Review of Systems: as noted in the History of Present Illness.  Patient Active Problem List   Diagnosis Date Noted  . Heavy menses 11/24/2018  . Lip swelling 11/24/2018  . Lower thoracic back pain 01/11/2017  . 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  . Plantar fasciitis, bilateral 03/13/2015  . 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  . Routine general medical examination at a health care facility 03/06/2012  . Atopic dermatitis 09/07/2010  . 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 Change in hearing 07/16/2010  . 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 SICKLE-CELL TRAIT 08/02/2006  . Eczema 08/02/2006  . Migraine 08/02/2006  . ANA POSITIVE 08/02/2006     Past Medical History:  Past Medical History:  Diagnosis Date  . Congenitally short metatarsal    Bilateral   . Eczema   . Gastritis   . Headache(784.0)   . Tonsillitis     Past Surgical History:  Past Surgical History:  Procedure Laterality Date  . TONSILLECTOMY      Past Obstetrical History:  OB History   Gravida Para Term Preterm AB Living  0 0 0 0 0 0  SAB TAB Ectopic Multiple Live Births  0 0 0 0      Past Gynecological History: As per HPI. Periods: qmonth, regular, 7 days, heavy and somewhat painful History of Pap Smear(s): Yes.   Last pap 2019, which was neg and hpv neg She is currently using no method for contraception.   Social History:  Social History   Socioeconomic History  . Marital status: Single    Spouse name: Not on file  . Number of children: 0  . Years of education: Not on file  . Highest education level: Not on file  Occupational History  . Occupation: CNA at [employer]  . Financial resource strain: Not on file  . Food insecurity    Worry: Not on file    Inability: Not on file  . Transportation needs    Medical: Not on file    Non-medical: Not on file  Tobacco Use  . Smoking status: Never Smoker  . Smokeless tobacco: Never Used  Substance and Sexual Activity  . Alcohol use: Yes    Alcohol/week: 0.0 standard drinks    Comment: occassionally wine  . Drug use: No  . Sexual activity: Yes    Partners: Male    Birth control/protection: Condom  Lifestyle  . Physical activity    Days per week: Not on file    Minutes per session: Not on file  . Stress: Not on file  Relationships  . Social Herbalist on phone: Not on file    Gets together: Not on file    Attends religious service: Not on file    Active member of club or organization: Not on file    Attends meetings of clubs or organizations: Not on file    Relationship status: Not on file  . Intimate partner violence    Fear of current or ex partner: Not on file    Emotionally abused: Not on file    Physically abused: Not on file    Forced sexual activity: Not on file  Other Topics Concern  . Not on file  Social History Narrative  . Not on file  Getting married later this year  Family History:  Family History  Problem Relation Age of Onset  . Breast cancer  Maternal Grandmother   . Breast cancer Maternal Aunt   . Cancer Maternal Uncle        Non Hodgkins/Colon     Medications Dannisha Threadgill had no medications administered during this visit. Current Outpatient Medications  Medication Sig Dispense Refill  . acyclovir (ZOVIRAX) 200 MG capsule Take 2 capsules (400 mg total) by mouth 4 (four) times daily as needed (for lip swelling). 30 capsule 5  . acyclovir (ZOVIRAX) 5 % cream Apply 1 application topically 5 (five) times daily. 15 g 0  . cholecalciferol (VITAMIN D) 25 MCG (1000 UT) tablet Take 2,000 Units by mouth daily.    . metaxalone (SKELAXIN) 800 MG tablet Take 800 mg by mouth 3 (three) times daily.    . mometasone (ELOCON) 0.1 % cream Apply topically daily. Apply to affected area for eczema once daily as needed - small amount. 45 g 3  . omeprazole (PRILOSEC) 20 MG capsule Take 1 capsule (20 mg total) by mouth 2 (two) times daily. 60 capsule 11  . SUMAtriptan (IMITREX) 100 MG tablet Take 1 tablet (100 mg total) by mouth every 2 (two) hours as needed for migraine. May repeat in 2 hours if headache persists or recurs. 10 tablet 11  . cyclobenzaprine (FLEXERIL) 10 MG tablet Take 1 tablet (10 mg total) by mouth 3 (three) times daily as needed for muscle spasms. Caution of sedation (Patient not taking: Reported on 11/28/2018) 30 tablet 3  . ondansetron (ZOFRAN-ODT) 4 MG disintegrating tablet Take 1 tablet (4 mg total) by mouth every 8 (eight) hours as needed for nausea or vomiting. (Patient not taking: Reported on 11/28/2018) 20 tablet 0   No current facility-administered medications for this visit.     Allergies Pseudoephedrine, Shrimp [shellfish allergy], and Sulfa antibiotics   Physical Exam:  BP 106/68   Pulse 72   Wt 152 lb 9.6 oz (69.2 kg)   LMP 11/09/2018   BMI 26.19 kg/m  Body mass index is 26.19 kg/m. General appearance: Well nourished, well developed female in no acute distress.  Neck:  Supple, normal appearance, and no  thyromegaly  Cardiovascular: normal s1 and s2.  No murmurs, rubs or gallops. Respiratory:  Clear to auscultation bilateral. Normal respiratory effort Abdomen: positive bowel sounds and no masses, hernias; diffusely non tender to palpation, non distended Breasts: breasts appear normal, no suspicious masses, no skin or nipple changes or axillary nodes, and negative palpation. Neuro/Psych:  Normal mood and affect.  Skin:  Warm and dry.  Lymphatic:  No inguinal lymphadenopathy.   Pelvic exam: is not limited by body habitus EGBUS: mild erythema and dryness on b/l labia majora.  Vagina: normal,+dryness.  White-yellow discharge, some cottage cheese d/c. Cervix: normal appearing cervix without tenderness, discharge or lesions. Uterus:  nonenlarged and non tender and Adnexa:  normal adnexa and no mass, fullness, tenderness Rectovaginal: deferred  Laboratory: none  Radiology:  10/24/17 IMPRESSION:   Multiple uterine fibroids. Trace free fluid. Nabothian cysts. Thick-walled right ovarian cyst, appears to be an involuting physiologic cyst  Electronically Signed by: Tomma Lightning  Result Narrative  TECHNIQUE: Real-time, multiplanar, grayscale and color and spectral Doppler ultrasound of the pelvis was performed via transabdominal and transvaginal approach.  COMPARISON: None  CLINICAL INDICATION: Uterine fibroids  FINDINGS:  Uterus: Measures 7.7 x 4.6 x 4.0 cm. Multiple uterine fibroids. Right uterine fundal fibroid measuring 2.9 x 2.0 x 2.4 cm. Lower uterine segment fibroid measuring 1.7 x 1.5 cm. The endometrial stripe is normal in thickness measuring 1.0 cm. There is  trace free pelvic fluid. Nabothian cysts in the cervix.  Right ovary: Measures 3.0 x 2.1 x 3 cm. Flow present by color and spectral Doppler analysis. Thick-walled cyst in the ovary measuring 2.3 x 1.4 x 1.3 cm. No mass or fluid collection.  Left ovary: Measures 2.6 x 2.4 x 1.4 cm. Flow present by color and spectral Doppler  analysis. No mass or fluid collection.  Other Result Information  Acute Interface, Incoming Rad Results - 10/24/2017  3:44 PM EDT TECHNIQUE: Real-time, multiplanar, grayscale and color and spectral Doppler ultrasound of the pelvis was performed via transabdominal and transvaginal  approach.  COMPARISON: None  CLINICAL INDICATION: Uterine fibroids  FINDINGS:  Uterus: Measures 7.7 x 4.6 x 4.0 cm. Multiple uterine fibroids. Right uterine fundal fibroid measuring 2.9 x 2.0 x 2.4 cm. Lower uterine segment fibroid measuring 1.7 x 1.5 cm. The endometrial stripe is normal in thickness measuring 1.0 cm. There is  trace free pelvic fluid. Nabothian cysts in the cervix.  Right ovary: Measures 3.0 x 2.1 x 3 cm. Flow present by color and spectral Doppler analysis. Thick-walled cyst in the ovary measuring 2.3 x 1.4 x 1.3 cm. No mass or fluid collection.  Left ovary: Measures 2.6 x 2.4 x 1.4 cm. Flow present by color and spectral Doppler analysis. No mass or fluid collection.   IMPRESSION:   Multiple uterine fibroids. Trace free fluid. Nabothian cysts. Thick-walled right ovarian cyst, appears to be an involuting physiologic cyst  Electronically Signed by: Tomma Lightning     Assessment: pt stable  Plan:  1. Menorrhagia with regular cycle D/w her could be related to fibroids but location and whether any are submucosal or aren't stated. Pt to request CD so can review images. Patient getting married this year. Declines any hormone management or BC in the mean time. Follow up CBC. Can d/w her PRN lysteda after labs, u/s back  2. Encounter for annual physical examination excluding gynecological examination in a patient older than 17 years See above. Routine care. Pt interested in genetic counseling for fhx of breast cancer. Information given to set this up. - CBC - Comprehensive metabolic panel - VITAMIN D 25 Hydroxy (Vit-D Deficiency, Fractures)  3. Yeast infection of the vagina - Candida 6  Species Profile, NAA - Hemoglobin A1c - Cervicovaginal ancillary only( [facility])  4. Vulvovaginal candidiasis See above.   RTC PRN. D/w pt after labs back  Durene Romans MD Attending [facility])

## 2018-11-28 NOTE — Progress Notes (Signed)
Lab pap 08/05/2017- normal  Yeast Infection   No sti testing

## 2018-11-28 NOTE — Progress Notes (Signed)
Would like to get a cbc and bmp today Discuss recurring yeast

## 2018-11-29 LAB — COMPREHENSIVE METABOLIC PANEL
ALT: 11 IU/L (ref 0–32)
AST: 15 IU/L (ref 0–40)
Albumin/Globulin Ratio: 1.9 (ref 1.2–2.2)
Albumin: 4.7 g/dL (ref 3.8–4.8)
Alkaline Phosphatase: 64 IU/L (ref 39–117)
BUN/Creatinine Ratio: 12 (ref 9–23)
BUN: 11 mg/dL (ref 6–20)
Bilirubin Total: 0.4 mg/dL (ref 0.0–1.2)
CO2: 20 mmol/L (ref 20–29)
Calcium: 9.2 mg/dL (ref 8.7–10.2)
Chloride: 108 mmol/L — ABNORMAL HIGH (ref 96–106)
Creatinine, Ser: 0.91 mg/dL (ref 0.57–1.00)
GFR calc Af Amer: 97 mL/min/{1.73_m2} (ref >59)
GFR calc non Af Amer: 84 mL/min/{1.73_m2} (ref >59)
Globulin, Total: 2.5 g/dL (ref 1.5–4.5)
Glucose: 104 mg/dL — ABNORMAL HIGH (ref 65–99)
Potassium: 4.3 mmol/L (ref 3.5–5.2)
Sodium: 140 mmol/L (ref 134–144)
Total Protein: 7.2 g/dL (ref 6.0–8.5)

## 2018-11-29 LAB — HEMOGLOBIN A1C
Est. average glucose Bld gHb Est-mCnc: 120 mg/dL
Hgb A1c MFr Bld: 5.8 % — ABNORMAL HIGH (ref 4.8–5.6)

## 2018-11-29 LAB — CBC
Hematocrit: 35.1 % (ref 34.0–46.6)
Hemoglobin: 12.3 g/dL (ref 11.1–15.9)
MCH: 30.3 pg (ref 26.6–33.0)
MCHC: 35 g/dL (ref 31.5–35.7)
MCV: 87 fL (ref 79–97)
Platelets: 289 10*3/uL (ref 150–450)
RBC: 4.06 x10E6/uL (ref 3.77–5.28)
RDW: 12.2 % (ref 11.7–15.4)
WBC: 5.7 10*3/uL (ref 3.4–10.8)

## 2018-11-29 LAB — VITAMIN D 25 HYDROXY (VIT D DEFICIENCY, FRACTURES): Vit D, 25-Hydroxy: 33.4 ng/mL (ref 30.0–100.0)

## 2018-12-01 LAB — CANDIDA 6 SPECIES PROFILE, NAA
C PARAPSILOSIS/TROPICALIS: NEGATIVE
Candida albicans, NAA: NEGATIVE
Candida glabrata, NAA: NEGATIVE
Candida krusei, NAA: NEGATIVE
Candida lusitaniae, NAA: NEGATIVE

## 2018-12-05 LAB — BACTERIAL VAGINOSIS, NAA

## 2018-12-05 LAB — SPECIMEN STATUS REPORT

## 2018-12-21 ENCOUNTER — Telehealth: Payer: Self-pay | Admitting: *Deleted

## 2018-12-21 NOTE — Telephone Encounter (Signed)
Pt informed of results, and will let us know if she decided she wants to try the cream.

## 2018-12-21 NOTE — Telephone Encounter (Signed)
-----   Message from Aletha Halim, MD sent at 12/21/2018 12:13 PM EDT ----- Can you let her know that all her labs and swabs came back negative. If she is still symptomatic with her itching, then can try a two week course of mycolog 2. Just let me know and I can send that in. thanks

## 2019-09-11 ENCOUNTER — Encounter: Payer: Self-pay | Admitting: Radiology

## 2020-10-20 ENCOUNTER — Other Ambulatory Visit: Payer: Self-pay

## 2020-10-20 ENCOUNTER — Ambulatory Visit: Payer: Managed Care, Other (non HMO) | Admitting: Family Medicine

## 2020-10-20 VITALS — BP 118/60 | HR 86 | Temp 98.2°F | Wt 178.4 lb

## 2020-10-20 DIAGNOSIS — M6208 Separation of muscle (nontraumatic), other site: Secondary | ICD-10-CM

## 2020-10-20 NOTE — Progress Notes (Signed)
Established Patient Office Visit  Subjective:  Patient ID: Kim Walsh, female    DOB: [birth year]  Age: 35 y.o. MRN: [id number]  CC: No chief complaint on file.   HPI Allan Bacigalupi presents for concern for possible hernia.  She states she had C-section back in January with 10 pound 11 ounce infant.  She was told she had some evidence for diastases recti afterwards.  She is noticed some bulging possibly worse on the right side but near the midline intermittently.  Occasional constipation.  She has good appetite.  No recent nausea or vomiting.  She states her concern is that her mom was just diagnosed with gastric cancer.  She had C-section as above and also prior history of cholecystectomy for what sounds like large polyp of the gallbladder.  Past Medical History:  Diagnosis Date   Congenitally short metatarsal    Bilateral    Eczema    Gastritis    Headache(784.0)    Tonsillitis     Past Surgical History:  Procedure Laterality Date   TONSILLECTOMY      Family History  Problem Relation Age of Onset   Breast cancer Maternal Grandmother    Breast cancer Maternal Aunt    Cancer Maternal Uncle        Non Hodgkins/Colon    Social History   Socioeconomic History   Marital status: Single    Spouse name: Not on file   Number of children: 0   Years of education: Not on file   Highest education level: Not on file  Occupational History   Occupation: CNA at [employer]  Tobacco Use   Smoking status: Never   Smokeless tobacco: Never  Substance and Sexual Activity   Alcohol use: Yes    Alcohol/week: 0.0 standard drinks    Comment: occassionally wine   Drug use: No   Sexual activity: Yes    Partners: Male    Birth control/protection: Condom  Other Topics Concern   Not on file  Social History Narrative   Not on file   Social Determinants of Health   Financial Resource Strain: Not on file  Food Insecurity: Not on file  Transportation  Needs: Not on file  Physical Activity: Not on file  Stress: Not on file  Social Connections: Not on file  Intimate Partner Violence: Not on file    Outpatient Medications Prior to Visit  Medication Sig Dispense Refill   acyclovir (ZOVIRAX) 200 MG capsule Take 2 capsules (400 mg total) by mouth 4 (four) times daily as needed (for lip swelling). 30 capsule 5   acyclovir (ZOVIRAX) 5 % cream Apply 1 application topically 5 (five) times daily. 15 g 0   cholecalciferol (VITAMIN D) 25 MCG (1000 UT) tablet Take 2,000 Units by mouth daily.     cyclobenzaprine (FLEXERIL) 10 MG tablet Take 1 tablet (10 mg total) by mouth 3 (three) times daily as needed for muscle spasms. Caution of sedation 30 tablet 3   metaxalone (SKELAXIN) 800 MG tablet Take 800 mg by mouth 3 (three) times daily.     mometasone (ELOCON) 0.1 % cream Apply topically daily. Apply to affected area for eczema once daily as needed - small amount. 45 g 3   omeprazole (PRILOSEC) 20 MG capsule Take 1 capsule (20 mg total) by mouth 2 (two) times daily. 60 capsule 11   ondansetron (ZOFRAN-ODT) 4 MG disintegrating tablet Take 1 tablet (4 mg total) by mouth every 8 (eight) hours as needed  for nausea or vomiting. 20 tablet 0   SUMAtriptan (IMITREX) 100 MG tablet Take 1 tablet (100 mg total) by mouth every 2 (two) hours as needed for migraine. May repeat in 2 hours if headache persists or recurs. 10 tablet 11   No facility-administered medications prior to visit.    Allergies  Allergen Reactions   Pseudoephedrine     REACTION: face itched, rash   Shrimp [Shellfish Allergy]    Sulfa Antibiotics     ROS Review of Systems  Constitutional:  Negative for appetite change, chills, fever and unexpected weight change.  Respiratory:  Negative for shortness of breath.   Cardiovascular:  Negative for chest pain.  Gastrointestinal:  Negative for blood in stool, diarrhea, nausea and vomiting.     Objective:    Physical Exam Vitals reviewed.   Constitutional:      Appearance: Normal appearance.  Cardiovascular:     Rate and Rhythm: Normal rate and regular rhythm.  Pulmonary:     Effort: Pulmonary effort is normal.     Breath sounds: Normal breath sounds.  Abdominal:     Palpations: Abdomen is soft.     Tenderness: There is no abdominal tenderness. There is no guarding.     Comments: She has evidence for diastases recti.  This is noted when she sits forward.  No definite hernia mass palpated.  Neurological:     Mental Status: She is alert.    BP 118/60 (BP Location: Left Arm, Patient Position: Sitting, Cuff Size: Normal)   Pulse 86   Temp 98.2 F (36.8 C) (Oral)   Wt 178 lb 6.4 oz (80.9 kg)   SpO2 96%   BMI 30.62 kg/m  Wt Readings from Last 3 Encounters:  10/20/20 178 lb 6.4 oz (80.9 kg)  11/28/18 152 lb 9.6 oz (69.2 kg)  11/24/18 149 lb (67.6 kg)     Health Maintenance Due  Topic Date Due   Pneumococcal Vaccine 79-71 Years old (1 - PCV) Never done   TETANUS/TDAP  11/19/2018   COVID-19 Vaccine (3 - Booster for Pfizer series) 10/13/2019   PAP SMEAR-Modifier  08/05/2020    There are no preventive care reminders to display for this patient.  Lab Results  Component Value Date   TSH 2.03 07/09/2015   Lab Results  Component Value Date   WBC 5.7 11/28/2018   HGB 12.3 11/28/2018   HCT 35.1 11/28/2018   MCV 87 11/28/2018   PLT 289 11/28/2018   Lab Results  Component Value Date   NA 140 11/28/2018   K 4.3 11/28/2018   CO2 20 11/28/2018   GLUCOSE 104 (H) 11/28/2018   BUN 11 11/28/2018   CREATININE 0.91 11/28/2018   BILITOT 0.4 11/28/2018   ALKPHOS 64 11/28/2018   AST 15 11/28/2018   ALT 11 11/28/2018   PROT 7.2 11/28/2018   ALBUMIN 4.7 11/28/2018   CALCIUM 9.2 11/28/2018   GFR 98.18 01/11/2017   Lab Results  Component Value Date   CHOL 162 07/09/2015   Lab Results  Component Value Date   HDL 43 (L) 07/09/2015   Lab Results  Component Value Date   LDLCALC 110 07/09/2015   Lab Results   Component Value Date   TRIG 44 07/09/2015   Lab Results  Component Value Date   CHOLHDL 3.8 07/09/2015   Lab Results  Component Value Date   HGBA1C 5.8 (H) 11/28/2018      Assessment & Plan:   Diastases recti following pregnancy and recent C-section  back in January.  No evidence for definite hernia.  -We recommend weight loss and set up physical therapy for some exercises.  No orders of the defined types were placed in this encounter.   Follow-up: No follow-ups on file.    Carolann Littler, MD

## 2020-10-21 ENCOUNTER — Telehealth: Payer: Self-pay

## 2020-10-21 NOTE — Telephone Encounter (Signed)
Patient was seen yesterday for issues with a bulging hernia by Dr. Elease Hashimoto. Patient wants to know if Dr. [name] would be willing to order a CT scan of her abdomen to see what exactly is going on. She is very concerned about this. States Dr. Elease Hashimoto would not order a CT because she is too young to be exposed to so much radiation.

## 2020-10-21 NOTE — Telephone Encounter (Signed)
I would rather refer her to a general surgeon to evaluate.  What area does she prefer?

## 2020-10-22 NOTE — Telephone Encounter (Signed)
I understand she is appropriately worried since her mother has GI cancer.   We will need to get bmet before ordering CT.   What GI symptoms is she having in addition to the appearance?

## 2020-10-22 NOTE — Telephone Encounter (Signed)
Pt said the provider she saw told her he couldn't tell if she had a hernia he could tell she had diastases recti due to recent pregnancy but said he couldn't tell for sure if she had a hernia. Pt wanted CT to see exactly what's going on before referring her to a surgeon and she isn't even sure what is actually wrong with her. Pt is still requesting order for CT 1st before proceeding with a surgeon eval to in fact see if it's a hernia or not, please advise

## 2020-10-23 NOTE — Telephone Encounter (Signed)
Left VM requesting pt to call the office back 

## 2020-10-28 NOTE — Telephone Encounter (Signed)
Aware, thanks!

## 2020-10-28 NOTE — Telephone Encounter (Signed)
Pt said after having an appt with other provider and hearing Dr. Marliss Coots answer she decided to go to her gyn to have the diastases recti evaluated. Pt said that her gyn told her that her diastases recti wasn't sever but she did agree with Dr. Elease Hashimoto that pt doesn't have a hernia. She said gyn told her that she probably is having an IBS flare up due to stress. Pt said she will give it a week or so like gyn recommended but if sxs don't start to improve she will call back to proceed with CT scan.  Pt said she is having abd pain, nausea, and constipation. But again GYN has given her recommendations and instructions so will follow their advise and keep [place] posted if she decides to proceed with CT but okay for now. Will route to PCP as an [place]

## 2020-11-10 ENCOUNTER — Ambulatory Visit: Payer: Managed Care, Other (non HMO) | Attending: Family Medicine | Admitting: Physical Therapy

## 2020-11-10 ENCOUNTER — Other Ambulatory Visit: Payer: Self-pay

## 2020-11-10 ENCOUNTER — Encounter: Payer: Self-pay | Admitting: Physical Therapy

## 2020-11-10 DIAGNOSIS — M6281 Muscle weakness (generalized): Secondary | ICD-10-CM | POA: Insufficient documentation

## 2020-11-10 DIAGNOSIS — R279 Unspecified lack of coordination: Secondary | ICD-10-CM | POA: Insufficient documentation

## 2020-11-10 DIAGNOSIS — R2689 Other abnormalities of gait and mobility: Secondary | ICD-10-CM | POA: Diagnosis present

## 2020-11-10 NOTE — Therapy (Signed)
[facility] [address] [place], [place], [address] Phone: [phone]   Fax:  [phone]  Physical Therapy Evaluation  Patient Details  Name: Kim Walsh MRN: [id number] Date of Birth: Aug 22, 1985 Referring Provider (PT): Eulas Post, MD   Encounter Date: 11/10/2020   PT End of Session - 11/10/20 1712     Visit Number 1    Authorization Type [employer]    PT Start Time [id number]    PT Stop Time [id number]    PT Time Calculation (min) 43 min    Activity Tolerance Patient tolerated treatment well;No increased pain    Behavior During Therapy WFL for tasks assessed/performed             Past Medical History:  Diagnosis Date   Congenitally short metatarsal    Bilateral    Eczema    Gastritis    Headache(784.0)    Tonsillitis     Past Surgical History:  Procedure Laterality Date   TONSILLECTOMY      There were no vitals filed for this visit.    Subjective Assessment - 11/10/20 1452     Subjective Pt reports she has had diastasis recti since pregnancy with baby, born in Jan 2022. Pt required a c-section with this child and has had a history of IBS since pregnancy.    Currently in Pain? No/denies    Pain Score 10-Worst pain ever   no pain currently but does report pain at [place] seperation site can be as bad a 10.   Pain Location Abdomen    Pain Orientation Mid    Pain Descriptors / Indicators Sharp;Shooting    Pain Type Chronic pain    Pain Onset More than a month ago    Pain Frequency Intermittent    Aggravating Factors  pt is unsure of specifc activities.    Pain Relieving Factors rest, warmth                [facility] PT Assessment - 11/10/20 0001       Assessment   Medical Diagnosis M62.08 (ICD-10-CM) - Diastasis recti    Referring Provider (PT) Eulas Post, MD    Onset Date/Surgical Date 04/11/20    Prior Therapy pt reports previous PT for back pain but can't remember when       Precautions   Precautions None      Restrictions   Weight Bearing Restrictions No      Balance Screen   Has the patient fallen in the past 6 months No    Has the patient had a decrease in activity level because of a fear of falling?  No    Is the patient reluctant to leave their home because of a fear of falling?  No      Home Social worker Private residence    Living Arrangements Spouse/significant other;Children    Available Help at Discharge Family;Friend(s)    Type of Home House    Additional Comments pt reports pain in bil knees with stairs      Prior Function   Level of Independence Independent    Vocation Full time employment    Vocation Requirements works in health care, reports she thinks she is going to decrease work to assist in taking care of her mother      Cognition   Overall Cognitive Status Within Functional Limits for tasks assessed      Sensation   Light Touch  Appears Intact      Coordination   Gross Motor Movements are Fluid and Coordinated Yes    Fine Motor Movements are Fluid and Coordinated Yes      Posture/Postural Control   Posture/Postural Control Postural limitations    Postural Limitations Rounded Shoulders;Posterior pelvic tilt      ROM / Strength   AROM / PROM / Strength AROM;Strength      AROM   Overall AROM  Deficits    AROM Assessment Site Lumbar;Thoracic    Lumbar Flexion limited by 25%    Lumbar Extension limited by 25%    Lumbar - Right Side Bend limited by 50%    Lumbar - Left Side Bend limited by 50%    Lumbar - Right Rotation limited by 25%    Lumbar - Left Rotation limited by 25%    Thoracic Flexion limited by 25%    Thoracic Extension limited by 25%    Thoracic - Right Side Bend limited by 50%    Thoracic - Left Side Bend limited by 50%    Thoracic - Right Rotation limited by 25%    Thoracic - Left Rotation limited by 25%      Strength   Overall Strength Deficits    Overall Strength Comments bil  hip abduction 4/5 all others 4+/5      Flexibility   Soft Tissue Assessment /Muscle Length yes      Palpation   Patella mobility --    Spinal mobility tenderness reported in lower Rt quadrant of abdomen and middle upper quadrant. mild TTP at spinal segments T10-L3    Palpation comment L pelvis posteriorly rotated. TTP at [place] seperation site                        Objective measurements completed on examination: See above findings.     Pelvic Floor Special Questions - 11/10/20 0001     Prior Pelvic/Prostate Exam Yes    Are you Pregnant or attempting pregnancy? No    Prior Pregnancies Yes    Number of Pregnancies 1    Number of C-Sections 1    Any difficulty with labor and deliveries No    Diastasis Recti yes   2 fingers above umbillicus with mild resistance   Currently Sexually Active Yes    Is this Painful No    History of sexually transmitted disease No    Marinoff Scale no problems    Urinary Leakage Yes    How often intermittently and only with strong cough/sneeze laughing    Pad use intermittent panty liner use    Activities that cause leaking Coughing;Laughing;Sneezing    Urinary urgency No    Fecal incontinence No    Fluid intake doesn't feel like she drinks enough    Caffeine beverages in the morning, trying to decrease this    Falling out feeling (prolapse) No    Pelvic Floor Internal Exam pt deferred at this time.                      PT Education - 11/10/20 1711     Education Details Pt educated on exam findings, HEP, POC and handouts for constipation, bladder irritants, abdominal massage demonstrated and discussed and the knack method as pt requested handouts to go over before next session.    Person(s) Educated Patient    Methods Explanation;Demonstration;Tactile cues;Verbal cues;Handout    Comprehension Verbalized understanding;Returned demonstration  PT Short Term Goals - 11/10/20 1717       PT SHORT TERM  GOAL #1   Title pt to be I with HEP    Time 6    Period Weeks    Status New    Target Date 12/22/20      PT SHORT TERM GOAL #2   Title pt to demonstate improved bil hip strength globally to 4+/5 for improved mechanics    Time 6    Period Weeks    Target Date 12/22/20      PT SHORT TERM GOAL #3   Title pt to demonstrate at least 3/5 global internal pelvic floor strength to decrease urinary leakage    Time 6    Period Weeks    Status New    Target Date 12/22/20      PT SHORT TERM GOAL #4   Title pt to demonstrated improved coordination of breathing, core strength and pelvic floor contract/relax at least 50% of the time with minimal cues    Time 6    Period Weeks    Status New    Target Date 12/22/20      PT SHORT TERM GOAL #5   Title pt to report decreased leakage to no more than one instance a week    Time 6    Period Weeks    Status New    Target Date 12/22/20               PT Long Term Goals - 11/10/20 1719       PT LONG TERM GOAL #1   Title pt to be I with advanced HEP    Time 4    Period Months    Status New    Target Date 03/12/21      PT LONG TERM GOAL #2   Title pt to demonstrated improved coordination of breathing, core strength and pelvic floor contract/relax at least 75% of the time with minimal cues    Time 4    Period Months    Status New    Target Date 03/12/21      PT LONG TERM GOAL #3   Title pt to demonstrate improved core activation with activity to limit strain on DRA and prevent worsening of seperation at least 75% of the time    Time 4    Period Months    Status New    Target Date 03/12/21      PT LONG TERM GOAL #4   Title pt to demonstrate at least 4/5 internal pelvic floor strength to decrease urinary leakage    Time 4    Period Months    Status New    Target Date 03/12/21      PT LONG TERM GOAL #5   Title pt to report no more than 1 instance of leakage a month    Time 4    Period Months    Status New    Target Date  03/12/21                    Plan - 11/10/20 1712     Clinical Impression Statement Pt is 35yo female with child born via c-section january 2022, since has had urinary leakage, DRA seperation with pain intermittently. Pt referred for this and reports intermittent IBS. Pt fond to have bil hip weakness, decreased hip and spinal flexibility, pain in spinal segments in eval notes, pain in abdomen, decreased breathing mechanics, decreased  core activation and need of cues for improved body mechanics. Pt educated on body mechanics, breathing technique, HEP and handouts given for techniques to improve urinary leakage. Pt did defer internal pelvic exam until at least next session to think about this and educated on techniques to try at home to improve kegel technique. Pt agreed. Pt found to have DRA seperation of two fingers above umbillicus, requesting taping at end of session and educated removal technique and to remove with any skin irritation pt agreed. Pt would benefit from PT to address exam findings.    Personal Factors and Comorbidities Time since onset of injury/illness/exacerbation;Fitness    Examination-Activity Limitations Continence;Locomotion Level    Examination-Participation Restrictions Community Activity    Stability/Clinical Decision Making Evolving/Moderate complexity    Clinical Decision Making Moderate    Rehab Potential Good    PT Frequency 1x / week             Patient will benefit from skilled therapeutic intervention in order to improve the following deficits and impairments:  Decreased coordination, Increased fascial restricitons, Decreased endurance, Decreased activity tolerance, Pain, Impaired flexibility, Improper body mechanics, Postural dysfunction, Decreased strength, Decreased mobility, Decreased scar mobility  Visit Diagnosis: Other abnormalities of gait and mobility - Plan: PT plan of care cert/re-cert  Unspecified lack of coordination - Plan: PT plan of  care cert/re-cert  Muscle weakness (generalized) - Plan: PT plan of care cert/re-cert     Problem List Patient Active Problem List   Diagnosis Date Noted   Fibroid 11/28/2018   Heavy menses 11/24/2018   Lip swelling 11/24/2018   Lower thoracic back pain 01/11/2017   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   Plantar fasciitis, bilateral 03/13/2015   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   Routine general medical examination at a health care facility 03/06/2012   Atopic dermatitis 09/07/2010   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 [place], [place], [address] Phone: [phone]   Fax:  [phone]  Name: Meritxell Valk MRN: [id number] Date of Birth: 11/03/1985

## 2020-11-10 NOTE — Patient Instructions (Signed)
Bladder Irritants  Certain foods and beverages can be irritating to the bladder.  Avoiding these irritants may decrease your symptoms of urinary urgency, frequency or bladder pain.  Even reducing your intake can help with your symptoms.  Not everyone is sensitive to all bladder irritants, so you may consider focusing on one irritant at a time, removing or reducing your intake of that irritant for 7-10 days to see if this change helps your symptoms.  Water intake is also very important.  Below is a list of bladder irritants.  Drinks: alcohol, carbonated beverages, caffeinated beverages such as coffee and tea, drinks with artificial sweeteners, citrus juices, apple juice, tomato juice  Foods: tomatoes and tomato based foods, spicy food, sugar and artificial sweeteners, vinegar, chocolate, raw onion, apples, citrus fruits, pineapple, cranberries, tomatoes, strawberries, plums, peaches, cantaloupe  Other: acidic urine (too concentrated) - see water intake info below  Substitutes you can try that are NOT irritating to the bladder: cooked onion, pears, papayas, sun-brewed decaf teas, watermelons, non-citrus herbal teas, apricots, kava and low-acid instant drinks (Postum).    WATER INTAKE: Remember to drink lots of water (aim for fluid intake of half your body weight with 2/3 of fluids being water).  You may be limiting fluids due to fear of leakage, but this can actually worsen urgency symptoms due to highly concentrated urine.  Water helps balance the pH of your urine so it doesn't become too acidic - acidic urine is a bladder irritant!   THE KNACK  The Knack is a strategy you may use to help to reduce or prevent leakage or passing of urine, gas or feces during an activity that causes downward force on the pelvic floor muscles.    Activities that can cause downward pressure on the pelvic floor muscles include coughing, sneezing, laughing, bending, lifting, and transitioning from different body  positions such as from laying down to sitting up and sitting to standing.  To perform The Knack, consciously squeeze and lift your pelvic floor muscles to perform a strong, well-timed pelvic muscle contraction BEFORE AND DURING these activities above.  As your contraction gets more coordinated and your muscles get stronger, you will become more effective in controlling your experience of incontinence or gas passing during these activities.    About Constipation  Constipation Overview Constipation is the most common gastrointestinal complaint -- about 4 million Americans experience constipation and make 2.5 million physician visits a year to get help for the problem.  Constipation can occur when the colon absorbs too much water, the colon's muscle contraction is slow or sluggish, and/or there is delayed transit time through the colon.  The result is stool that is hard and dry.  Indicators of constipation include straining during bowel movements greater than 25% of the time, having fewer than three bowel movements per week, and/or the feeling of incomplete evacuation.  There are established guidelines ([place] II ) for defining constipation. A person needs to have two or more of the following symptoms for at least 12 weeks (not necessarily consecutive) in the preceding 12 months: Straining in  greater than 25% of bowel movements Lumpy or hard stools in greater than 25% of bowel movements Sensation of incomplete emptying in greater than 25% of bowel movements Sensation of anorectal obstruction/blockade in greater than 25% of bowel movements Manual maneuvers to help empty greater than 25% of bowel movements (e.g., digital evacuation, support of the pelvic floor)  Less than  3 bowel movements/week Loose stools are not present,  and criteria for irritable bowel syndrome are insufficient Common Causes of Constipation lack of fiber in your diet lack of physical activity medications, including iron and  calcium supplements  dairy intake dehydration abuse of laxatives travel Irritable Bowel Syndrome pregnancy luteal phase of menstruation (after ovulation and before menses) colorectal problems intestinal dysfunction  Treating Constipation  There are several ways of treating constipation, including changes to diet and exercise, use of laxatives, adjustments to the pelvic floor, and scheduled toileting.  These treatments include: increasing fiber and fluids in the diet  increasing physical activity learning muscle coordination  learning proper toileting techniques and toileting modifications  designing and sticking  to a toileting schedule   A high fiber diet with plenty of fluids (up to 8 glasses of water daily) is suggested to relieve these symptoms.  Metamucil, 1 tablespoon once or twice daily can be used to keep bowels regular if needed.

## 2020-11-17 ENCOUNTER — Ambulatory Visit: Payer: Managed Care, Other (non HMO) | Admitting: Physical Therapy

## 2020-11-17 ENCOUNTER — Other Ambulatory Visit: Payer: Self-pay

## 2020-11-17 DIAGNOSIS — M6281 Muscle weakness (generalized): Secondary | ICD-10-CM

## 2020-11-17 DIAGNOSIS — R279 Unspecified lack of coordination: Secondary | ICD-10-CM

## 2020-11-17 DIAGNOSIS — R2689 Other abnormalities of gait and mobility: Secondary | ICD-10-CM | POA: Diagnosis not present

## 2020-11-17 NOTE — Patient Instructions (Signed)
About Constipation  Constipation Overview Constipation is the most common gastrointestinal complaint -- about 4 million Americans experience constipation and make 2.5 million physician visits a year to get help for the problem.  Constipation can occur when the colon absorbs too much water, the colon's muscle contraction is slow or sluggish, and/or there is delayed transit time through the colon.  The result is stool that is hard and dry.  Indicators of constipation include straining during bowel movements greater than 25% of the time, having fewer than three bowel movements per week, and/or the feeling of incomplete evacuation.  There are established guidelines ([place] II ) for defining constipation. A person needs to have two or more of the following symptoms for at least 12 weeks (not necessarily consecutive) in the preceding 12 months: Straining in  greater than 25% of bowel movements Lumpy or hard stools in greater than 25% of bowel movements Sensation of incomplete emptying in greater than 25% of bowel movements Sensation of anorectal obstruction/blockade in greater than 25% of bowel movements Manual maneuvers to help empty greater than 25% of bowel movements (e.g., digital evacuation, support of the pelvic floor)  Less than  3 bowel movements/week Loose stools are not present, and criteria for irritable bowel syndrome are insufficient Common Causes of Constipation lack of fiber in your diet lack of physical activity medications, including iron and calcium supplements  dairy intake dehydration abuse of laxatives travel Irritable Bowel Syndrome pregnancy luteal phase of menstruation (after ovulation and before menses) colorectal problems intestinal dysfunction  Treating Constipation  There are several ways of treating constipation, including changes to diet and exercise, use of laxatives, adjustments to the pelvic floor, and scheduled toileting.  These treatments include: increasing  fiber and fluids in the diet  increasing physical activity learning muscle coordination  learning proper toileting techniques and toileting modifications  designing and sticking  to a toileting schedule

## 2020-11-17 NOTE — Therapy (Signed)
[facility] [address] [place], [place], [address] Phone: [phone]   Fax:  [phone]  Physical Therapy Treatment  Patient Details  Name: Kim Walsh MRN: [id number] Date of Birth: 22-May-1985 Referring Provider (PT): Eulas Post, MD   Encounter Date: 11/17/2020   PT End of Session - 11/17/20 1543     Visit Number 2    Date for PT Re-Evaluation 03/12/21    Authorization Type [employer]    PT Start Time 1446    PT Stop Time 1532    PT Time Calculation (min) 46 min    Activity Tolerance Patient tolerated treatment well;No increased pain    Behavior During Therapy WFL for tasks assessed/performed             Past Medical History:  Diagnosis Date   Congenitally short metatarsal    Bilateral    Eczema    Gastritis    Headache(784.0)    Tonsillitis     Past Surgical History:  Procedure Laterality Date   TONSILLECTOMY      There were no vitals filed for this visit.   Subjective Assessment - 11/17/20 1527     Subjective Pt reports the tape from last session helped a lot with support at [facility] and feels she has had no pain there.    Currently in Pain? No/denies                               [facility] Adult PT Treatment/Exercise - 11/17/20 0001       Self-Care   Self-Care Other Self-Care Comments    Other Self-Care Comments  pt educated on voiding mechanics and contipation information given.      Exercises   Exercises Lumbar      Lumbar Exercises: Seated   Other Seated Lumbar Exercises x10 pallofss green band each way and x10 palloffs with rotation proper breathing throughout      Manual Therapy   Manual Therapy Taping;Soft tissue mobilization    Manual therapy comments DRA taping above umbillicus    Soft tissue mobilization suction cup and massage posterior paraspinals and anterior and lateral trunk for improved tissue mobility at [facility]                     PT Education - 11/17/20 1541     Education Details Pt educated on [facility], voiding mechanics, and urge drill with improving bladder voiding frequency.    Methods Explanation;Demonstration;Tactile cues;Verbal cues;Handout    Comprehension Verbalized understanding;Returned demonstration              PT Short Term Goals - 11/10/20 1717       PT SHORT TERM GOAL #1   Title pt to be I with HEP    Time 6    Period Weeks    Status New    Target Date 12/22/20      PT SHORT TERM GOAL #2   Title pt to demonstate improved bil hip strength globally to 4+/5 for improved mechanics    Time 6    Period Weeks    Target Date 12/22/20      PT SHORT TERM GOAL #3   Title pt to demonstrate at least 3/5 global internal pelvic floor strength to decrease urinary leakage    Time 6    Period Weeks    Status New    Target Date 12/22/20  PT SHORT TERM GOAL #4   Title pt to demonstrated improved coordination of breathing, core strength and pelvic floor contract/relax at least 50% of the time with minimal cues    Time 6    Period Weeks    Status New    Target Date 12/22/20      PT SHORT TERM GOAL #5   Title pt to report decreased leakage to no more than one instance a week    Time 6    Period Weeks    Status New    Target Date 12/22/20               PT Long Term Goals - 11/10/20 1719       PT LONG TERM GOAL #1   Title pt to be I with advanced HEP    Time 4    Period Months    Status New    Target Date 03/12/21      PT LONG TERM GOAL #2   Title pt to demonstrated improved coordination of breathing, core strength and pelvic floor contract/relax at least 75% of the time with minimal cues    Time 4    Period Months    Status New    Target Date 03/12/21      PT LONG TERM GOAL #3   Title pt to demonstrate improved core activation with activity to limit strain on DRA and prevent worsening of seperation at least 75% of the time    Time 4     Period Months    Status New    Target Date 03/12/21      PT LONG TERM GOAL #4   Title pt to demonstrate at least 4/5 internal pelvic floor strength to decrease urinary leakage    Time 4    Period Months    Status New    Target Date 03/12/21      PT LONG TERM GOAL #5   Title pt to report no more than 1 instance of leakage a month    Time 4    Period Months    Status New    Target Date 03/12/21                   Plan - 11/17/20 1543     Clinical Impression Statement Pt presents to clinic with reports of improvement with taping last session no pain at [place] site and has not been able to complete exercises with work schedule this past week. Pt session focused on core activation and strengthening and education for improved mechanics with breathing and voiding and manual for improved tissue mobility and DRA healing. Pt would benefit from PT to address exam findings.    Personal Factors and Comorbidities Time since onset of injury/illness/exacerbation;Fitness    Examination-Activity Limitations Continence;Locomotion Level    Examination-Participation Restrictions Community Activity    Stability/Clinical Decision Making Evolving/Moderate complexity    Clinical Decision Making Moderate    Rehab Potential Good    PT Frequency 1x / week    PT Duration 12 weeks    PT Treatment/Interventions ADLs/Self Care Home Management;Functional mobility training;Therapeutic activities;Therapeutic exercise;Neuromuscular re-education;Manual techniques;Patient/family education;Manual lymph drainage;Passive range of motion;Dry needling;Energy conservation;Taping;Scar mobilization    PT Next Visit Plan core strength hip strength    PT Home Exercise Plan Access Code [email or web address]    Consulted and Agree with Plan of Care Patient             Patient will benefit from skilled  therapeutic intervention in order to improve the following deficits and impairments:  Decreased coordination, Increased fascial  restricitons, Decreased endurance, Decreased activity tolerance, Pain, Impaired flexibility, Improper body mechanics, Postural dysfunction, Decreased strength, Decreased mobility, Decreased scar mobility  Visit Diagnosis: Lack of coordination  Muscle weakness (generalized)     Problem List Patient Active Problem List   Diagnosis Date Noted   Fibroid 11/28/2018   Heavy menses 11/24/2018   Lip swelling 11/24/2018   Lower thoracic back pain 01/11/2017   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   Plantar fasciitis, bilateral 03/13/2015   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   Routine general medical examination at a health care facility 03/06/2012   Atopic dermatitis 09/07/2010   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 [place], [place], [address] Phone: [phone]   Fax:  [phone]  Name: Kim Walsh MRN: [id number] Date of Birth: March 31, 1986

## 2020-11-25 ENCOUNTER — Ambulatory Visit: Payer: Managed Care, Other (non HMO) | Admitting: Physical Therapy

## 2020-12-04 ENCOUNTER — Encounter: Payer: Managed Care, Other (non HMO) | Admitting: Physical Therapy

## 2020-12-09 ENCOUNTER — Ambulatory Visit: Payer: Managed Care, Other (non HMO) | Attending: Family Medicine | Admitting: Physical Therapy

## 2020-12-09 ENCOUNTER — Telehealth: Payer: Self-pay | Admitting: Physical Therapy

## 2020-12-09 NOTE — Telephone Encounter (Signed)
PT called pt about today's [id number] appointment however pt's mother has just passed and she was unable to make to the appointment and reports she attempted to call yesterday but clinic was closed for holiday and pt did not leave message. Pt reports she will call back at a later time to schedule appointments.   Stacy Gardner, PT, DPT 09/06/223:38 PM

## 2020-12-18 ENCOUNTER — Ambulatory Visit
Admission: RE | Admit: 2020-12-18 | Discharge: 2020-12-18 | Disposition: A | Discharge status: Home / Self Care | Payer: Managed Care, Other (non HMO) | Source: Ambulatory Visit | Attending: Emergency Medicine | Admitting: Emergency Medicine

## 2020-12-18 ENCOUNTER — Other Ambulatory Visit: Payer: Self-pay

## 2020-12-18 ENCOUNTER — Telehealth: Payer: Self-pay | Admitting: Family Medicine

## 2020-12-18 VITALS — BP 124/77 | HR 113 | Temp 99.5°F | Resp 18

## 2020-12-18 DIAGNOSIS — Z20822 Contact with and (suspected) exposure to covid-19: Secondary | ICD-10-CM

## 2020-12-18 MED ORDER — IBUPROFEN 800 MG PO TABS: 800.0000 mg | ORAL_TABLET | Freq: Three times a day (TID) | ORAL | 0 refills | Status: DC

## 2020-12-18 MED ORDER — BENZONATATE 200 MG PO CAPS: 200.0000 mg | ORAL_CAPSULE | Freq: Three times a day (TID) | ORAL | 0 refills | Status: DC | PRN

## 2020-12-18 MED ORDER — GUAIFENESIN-DM 100-10 MG/5ML PO SYRP: 10.0000 mL | ORAL_SOLUTION | Freq: Four times a day (QID) | ORAL | 0 refills | Status: DC | PRN

## 2020-12-18 MED ORDER — FLUTICASONE PROPIONATE 50 MCG/ACT NA SUSP: 1.0000 | Freq: Every day | NASAL | 0 refills | Status: DC

## 2020-12-18 NOTE — Telephone Encounter (Signed)
Pt said tested + covid at home test on 12/18/20; symptoms started on 12/11/20 started coughing and then began to feel better and then cough started again on 12/17/20; pt cannot say when she started symptoms. Today has fever 100.5,chills, dry cough, no SOB but hurts in mid chest with dull pain on and off; the more pt coughs the worse her chest feels. No wheezing. Body aches started today and having scratchy S/T and head congestion. No H/a, no diarrhea or vomiting, no loss of taste or smell, no runny nose. Self quarantine, drink plenty of fluids, rest, and take Tylenol for fever. UC & ED precautions given and pt voiced understanding. Pt feels really bad; I scheduled pt and appt 12/18/20 at 10:45 at [place]. Sending note to DR [name] and [place] CMA. Sending teams to Valinda Hoar who is working with Dr [name] this morning.

## 2020-12-18 NOTE — Telephone Encounter (Signed)
Patient called in stated she tested positive for Covid symptoms of cough,chills ,fever.. Please Advise [phone]

## 2020-12-18 NOTE — Telephone Encounter (Signed)
Agree with that advisement for in person visit at urgent care.  Will watch for correspondence.  Thanks for getting the appt

## 2020-12-18 NOTE — ED Triage Notes (Signed)
Pt reports having cough that started yesterday, she states she has been having some chest pain with the cough). Patient has a positive Covid-19 test last night. She reports having body aches, chills, and fever.

## 2020-12-18 NOTE — Discharge Instructions (Addendum)
Quarantine for 5 days, wear mask for full 10 days Tylenol and ibuprofen as needed for headache, body aches, fevers, sore throat Continue Mucinex to help with congestion/mucus Flonase nasal spray 1 to 2 spray in each nostril daily Tessalon every 8 hours for cough May use other over-the-counter medicine if preferred Please follow-up if not improving or worsening

## 2020-12-18 NOTE — ED Provider Notes (Signed)
[facility]    CSN: [id number] Arrival date & time: 12/18/20  1039      History   Chief Complaint Chief Complaint  Patient presents with   Fever   Fatigue   Chills    HPI Anjeli Schlesser is a 35 y.o. female presenting today for evaluation of cough.  Reports yesterday developed worsening cough, congestion, body aches, low-grade fevers.  Reports at home COVID test positive this morning.  Has had discomfort in her chest with coughing, denies at rest.  Denies history of asthma, tobacco use.  Denies prior COVID infection.  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 Authorizing Provider  fluticasone (FLONASE) 50 MCG/ACT nasal spray Place 1-2 sprays into both nostrils daily. 12/18/20  Yes Eliannah Hinde C, PA-C  guaiFENesin-dextromethorphan (ROBITUSSIN DM) 100-10 MG/5ML syrup Take 10 mLs by mouth every 6 (six) hours as needed for cough. 12/18/20  Yes Mellie Buccellato,  Jakaya Jacobowitz C, PA-C  ibuprofen (ADVIL) 800 MG tablet Take 1 tablet (800 mg total) by mouth 3 (three) times daily. 12/18/20  Yes Devlynn Knoff C, PA-C  acyclovir (ZOVIRAX) 200 MG capsule Take 2 capsules (400 mg total) by mouth 4 (four) times daily as needed (for lip swelling). 11/24/18   Tower, [name], MD  acyclovir (ZOVIRAX) 5 % cream Apply 1 application topically 5 (five) times daily. 09/07/10   Ria Bush, MD  cholecalciferol (VITAMIN D) 25 MCG (1000 UT) tablet Take 2,000 Units by mouth daily.    [provider]  cyclobenzaprine (FLEXERIL) 10 MG tablet Take 1 tablet (10 mg total) by mouth 3 (three) times daily as needed for muscle spasms. Caution of sedation 11/26/18   Tower, [name], MD  metaxalone (SKELAXIN) 800 MG tablet Take 800 mg by mouth 3 (three) times daily.    [provider]  mometasone (ELOCON) 0.1 % cream Apply topically daily. Apply to affected area for eczema once daily as needed - small amount. 11/24/18   Tower, [name], MD  omeprazole (PRILOSEC) 20 MG capsule Take 1 capsule (20 mg total) by mouth 2 (two) times daily. 11/26/18   Tower, [place]  A, MD  ondansetron (ZOFRAN-ODT) 4 MG disintegrating tablet Take 1 tablet (4 mg total) by mouth every 8 (eight) hours as needed for nausea or vomiting. 06/03/17   Tower, [name], MD  SUMAtriptan (IMITREX) 100 MG tablet Take 1 tablet (100 mg total) by mouth every 2 (two) hours as needed for migraine. May repeat in 2 hours if headache persists or recurs. 11/24/18   Tower, [name], MD    Family History Family History  Problem Relation Age of Onset   Breast cancer Maternal Grandmother    Breast cancer Maternal Aunt    Cancer Maternal Uncle        Non Hodgkins/Colon    Social History Social History   Tobacco Use   Smoking status: Never   Smokeless tobacco: Never  Substance Use Topics   Alcohol use: Yes    Alcohol/week: 0.0 standard drinks    Comment: occassionally wine   Drug use: No     Allergies   Shrimp [shellfish  allergy], Sulfa antibiotics, and Pseudoephedrine   Review of Systems Review of Systems  Constitutional:  Positive for fatigue and fever. Negative for activity change, appetite change and chills.  HENT:  Positive for congestion, rhinorrhea and sore throat. Negative for ear pain, sinus pressure and trouble swallowing.   Eyes:  Negative for discharge and redness.  Respiratory:  Positive for cough. Negative for chest tightness and shortness of breath.   Cardiovascular:  Negative for chest pain.  Gastrointestinal:  Negative for abdominal pain, diarrhea, nausea and vomiting.  Musculoskeletal:  Positive for myalgias.  Skin:  Negative for rash.  Neurological:  Negative for dizziness, light-headedness and headaches.    Physical Exam Triage Vital Signs ED Triage Vitals  Enc Vitals Group     BP 12/18/20 1057 124/77     Pulse Rate 12/18/20 1057 (!) 113     Resp 12/18/20 1057 18     Temp 12/18/20 1057 99.5 F (37.5 C)     Temp Source 12/18/20 1057 Oral     SpO2 12/18/20 1057 97 %     Weight --      Height --      Head Circumference --      Peak Flow --      Pain Score 12/18/20 1054 5     Pain Loc --      Pain Edu? --      Excl. in [place]? --    No data found.  Updated Vital Signs BP 124/77 (BP Location: Right Arm)   Pulse (!) 113   Temp 99.5 F (37.5 C) (Oral)   Resp 18   LMP 12/04/2020 (Approximate)   SpO2 97%   Visual Acuity Right Eye Distance:   Left Eye Distance:   Bilateral Distance:    Right Eye Near:   Left Eye Near:    Bilateral Near:     Physical Exam Vitals and nursing note reviewed.  Constitutional:      Appearance: She is well-developed.     Comments: No acute distress  HENT:     Head: Normocephalic and atraumatic.     Ears:     Comments: Bilateral ears without tenderness to palpation of external auricle, tragus and mastoid, EAC's without erythema or swelling, TM's with good bony landmarks and cone of light. Non erythematous.      Nose: Nose normal.      Mouth/Throat:     Comments: Oral mucosa pink and moist, no tonsillar enlargement or exudate. Posterior pharynx patent  and nonerythematous, no uvula deviation or swelling. Normal phonation.  Eyes:     Conjunctiva/sclera: Conjunctivae normal.  Cardiovascular:     Rate and Rhythm: Tachycardia present.  Pulmonary:     Effort: Pulmonary effort is normal. No respiratory distress.     Comments: Breathing comfortably at rest, CTABL, no wheezing, rales or other adventitious sounds auscultated  Abdominal:     General: There is no distension.  Musculoskeletal:        General: Normal range of motion.     Cervical back: Neck supple.  Skin:    General: Skin is warm and dry.  Neurological:     Mental Status: She is alert and oriented to person, place, and time.     UC Treatments / Results  Labs (all labs ordered are listed, but only abnormal results are displayed) Labs Reviewed - No data to display  EKG   Radiology No results found.  Procedures Procedures (including critical care time)  Medications Ordered in UC Medications - No data to display  Initial Impression / Assessment and Plan / UC Course  I have reviewed the triage vital signs and the nursing notes.  Pertinent labs & imaging results that were available during my care of the patient were reviewed by me and considered in my medical decision making (see chart for details).     Suspected COVID infection-at home positive, symptoms began yesterday, recommending symptomatic and supportive care, will clear to auscultation, no hypoxia, recommendations provided.  Discussed strict return precautions. Patient verbalized understanding and is agreeable with plan.  Final Clinical Impressions(s) / UC Diagnoses   Final diagnoses:  Suspected COVID-19 virus infection     Discharge Instructions      Quarantine for 5 days, wear mask for full 10 days Tylenol and ibuprofen as needed for headache, body aches, fevers, sore  throat Continue Mucinex to help with congestion/mucus Flonase nasal spray 1 to 2 spray in each nostril daily Tessalon every 8 hours for cough May use other over-the-counter medicine if preferred Please follow-up if not improving or worsening     ED Prescriptions     Medication Sig Dispense Auth. Provider   benzonatate (TESSALON) 200 MG capsule  (Status: Discontinued) Take 1 capsule (200 mg total) by mouth 3 (three) times daily as needed for up to 7 days for cough. 28 capsule Yader Criger C, PA-C   ibuprofen (ADVIL) 800 MG tablet Take 1 tablet (800 mg total) by mouth 3 (three) times daily. 21 tablet Virgia Kelner C, PA-C   fluticasone (FLONASE) 50 MCG/ACT nasal spray Place 1-2 sprays into both nostrils daily. 16 g Saifan Rayford C, PA-C   guaiFENesin-dextromethorphan (ROBITUSSIN DM) 100-10 MG/5ML syrup Take 10 mLs by mouth every 6 (six) hours as needed for cough. 118 mL Haven Pylant, Kenton Vale C, PA-C      PDMP not reviewed this encounter.   Janith Lima, [place] 12/18/20 1132

## 2021-05-05 ENCOUNTER — Telehealth (INDEPENDENT_AMBULATORY_CARE_PROVIDER_SITE_OTHER): Payer: Self-pay | Admitting: Family Medicine

## 2021-05-05 ENCOUNTER — Encounter: Payer: Self-pay | Admitting: Family Medicine

## 2021-05-05 ENCOUNTER — Other Ambulatory Visit: Payer: Self-pay

## 2021-05-05 VITALS — BP 125/81 | HR 98 | Temp 97.9°F | Ht 64.0 in | Wt 178.0 lb

## 2021-05-05 DIAGNOSIS — R051 Acute cough: Secondary | ICD-10-CM | POA: Insufficient documentation

## 2021-05-05 MED ORDER — ALBUTEROL SULFATE HFA 108 (90 BASE) MCG/ACT IN AERS: 2.0000 | INHALATION_SPRAY | Freq: Four times a day (QID) | RESPIRATORY_TRACT | 0 refills | Status: DC | PRN

## 2021-05-05 MED ORDER — PREDNISONE 20 MG PO TABS: ORAL_TABLET | ORAL | 0 refills | Status: DC

## 2021-05-05 MED ORDER — GUAIFENESIN-CODEINE 100-10 MG/5ML PO SYRP: 5.0000 mL | ORAL_SOLUTION | Freq: Every evening | ORAL | 0 refills | Status: DC | PRN

## 2021-05-05 NOTE — Progress Notes (Signed)
VIRTUAL VISIT Due to national recommendations of social distancing due to [place] 19, a virtual visit is felt to be most appropriate for this patient at this time.   I connected with the patient on 05/05/21 at  3:00 PM EST by virtual telehealth platform and verified that I am speaking with the correct person using two identifiers.   I discussed the limitations, risks, security and privacy concerns of performing an evaluation and management service by  virtual telehealth platform and the availability of in person appointments. I also discussed with the patient that there may be a patient responsible charge related to this service. The patient expressed understanding and agreed to proceed.  Patient location: Home Provider Location: [place] [facility] [place] Participants: Eliezer Lofts and Mcarthur Rossetti   Chief Complaint  Patient presents with   Cough    Negative Home Covid Test last night Symptoms started over the weekend   Wheezing   Nasal Congestion    Loss of smell     History of Present Illness: 36 year old female patient of Dr. [name]  presents with new onset congestion, loss of smell  and wheeze.   Date of Onset: 05/01/2021  She  reports she started with  sore throat,  dry cough.  Over weekend started feeling tired, occ chest pain with coughing. Nasal congestion started.  Today felt chills, no fever.  Started feeling mild short of breath, and having wheezes.   Loss of taste and smell today.   Using mucinex PM.   No asthma or COPD.  COVID 19 screen COVID testing:neg home COVID test last night, second test today is negative. COVID vaccine: 05/16/2019 , 04/26/2019 COVID exposure: No recent travel or known exposure to [place].. her daughter is in daycare.  The importance of social distancing was discussed today.    Review of Systems  Constitutional:  Negative for chills and fever.  HENT:  Positive for congestion and sore throat. Negative for ear pain.   Eyes:  Negative  for pain and redness.  Respiratory:  Positive for shortness of breath and wheezing. Negative for cough.   Cardiovascular:  Negative for chest pain, palpitations and leg swelling.  Gastrointestinal:  Negative for abdominal pain, blood in stool, constipation, diarrhea, nausea and vomiting.  Genitourinary:  Negative for dysuria.  Musculoskeletal:  Negative for falls and myalgias.  Skin:  Negative for rash.  Neurological:  Negative for dizziness.  Psychiatric/Behavioral:  Negative for depression. The patient is not nervous/anxious.      Past Medical History:  Diagnosis Date   Congenitally short metatarsal    Bilateral    Eczema    Gastritis    Headache(784.0)    Tonsillitis     reports that she has never smoked. She has never used smokeless tobacco. She reports current alcohol use. She reports that she does not use drugs.   Current Outpatient Medications:    acyclovir (ZOVIRAX) 200 MG capsule, Take 2 capsules (400 mg total) by mouth 4 (four) times daily as needed (for lip swelling)., Disp: 30 capsule, Rfl: 5   acyclovir (ZOVIRAX) 5 % cream, Apply 1 application topically 5 (five) times daily., Disp: 15 g, Rfl: 0   cholecalciferol (VITAMIN D) 25 MCG (1000 UT) tablet, Take 2,000 Units by mouth daily., Disp: , Rfl:    cyclobenzaprine (FLEXERIL) 10 MG tablet, Take 1 tablet (10 mg total) by mouth 3 (three) times daily as needed for muscle spasms. Caution of sedation, Disp: 30 tablet, Rfl: 3   fluticasone (FLONASE)  50 MCG/ACT nasal spray, Place 1-2 sprays into both nostrils daily., Disp: 16 g, Rfl: 0   guaiFENesin-dextromethorphan (ROBITUSSIN DM) 100-10 MG/5ML syrup, Take 10 mLs by mouth every 6 (six) hours as needed for cough., Disp: 118 mL, Rfl: 0   ibuprofen (ADVIL) 800 MG tablet, Take 1 tablet (800 mg total) by mouth 3 (three) times daily., Disp: 21 tablet, Rfl: 0   metaxalone (SKELAXIN) 800 MG tablet, Take 800 mg by mouth 3 (three) times daily., Disp: , Rfl:    mometasone (ELOCON) 0.1 %  cream, Apply topically daily. Apply to affected area for eczema once daily as needed - small amount., Disp: 45 g, Rfl: 3   omeprazole (PRILOSEC) 20 MG capsule, Take 1 capsule (20 mg total) by mouth 2 (two) times daily., Disp: 60 capsule, Rfl: 11   ondansetron (ZOFRAN-ODT) 4 MG disintegrating tablet, Take 1 tablet (4 mg total) by mouth every 8 (eight) hours as needed for nausea or vomiting., Disp: 20 tablet, Rfl: 0   SUMAtriptan (IMITREX) 100 MG tablet, Take 1 tablet (100 mg total) by mouth every 2 (two) hours as needed for migraine. May repeat in 2 hours if headache persists or recurs., Disp: 10 tablet, Rfl: 11   Observations/Objective: Temperature 97.9 F (36.6 C), temperature source Tympanic, height 5\' 4"  (1.626 m), weight 178 lb (80.7 kg), last menstrual period 04/18/2021.  Physical Exam  Physical Exam Constitutional:      General: The patient is not in acute distress. Pulmonary:     Effort: Pulmonary effort is normal. No respiratory distress.  Neurological:     Mental Status: The patient is alert and oriented to person, place, and time.  Psychiatric:        Mood and Affect: Mood normal.        Behavior: Behavior normal.   Assessment and Plan    Problem List Items Addressed This Visit     Acute cough - Primary    Viral infection likely with resulting reactive airways.  2 neg home tests for COVID.  Treat with prednisone taper, flonase, mucinex, rest and fluids.  If not imprtoing in next 3-4 days... or new fever call for consideration of antibiotics.       I discussed the assessment and treatment plan with the patient. The patient was provided an opportunity to ask questions and all were answered. The patient agreed with the plan and demonstrated an understanding of the instructions.   The patient was advised to call back or seek an in-person evaluation if the symptoms worsen or if the condition fails to improve as anticipated.     Eliezer Lofts, MD

## 2021-05-05 NOTE — Assessment & Plan Note (Signed)
Viral infection likely with resulting reactive airways.  2 neg home tests for COVID.  Treat with prednisone taper, flonase, mucinex, rest and fluids.  If not imprtoing in next 3-4 days... or new fever call for consideration of antibiotics.

## 2021-05-06 ENCOUNTER — Other Ambulatory Visit: Payer: Self-pay

## 2021-05-06 ENCOUNTER — Encounter (HOSPITAL_BASED_OUTPATIENT_CLINIC_OR_DEPARTMENT_OTHER): Payer: Self-pay

## 2021-05-06 ENCOUNTER — Emergency Department (HOSPITAL_BASED_OUTPATIENT_CLINIC_OR_DEPARTMENT_OTHER)
Admission: EM | Admit: 2021-05-06 | Discharge: 2021-05-06 | Disposition: A | Discharge status: Home / Self Care | Payer: 59 | Attending: Emergency Medicine | Admitting: Emergency Medicine

## 2021-05-06 ENCOUNTER — Emergency Department (HOSPITAL_BASED_OUTPATIENT_CLINIC_OR_DEPARTMENT_OTHER): Payer: 59

## 2021-05-06 DIAGNOSIS — R109 Unspecified abdominal pain: Secondary | ICD-10-CM

## 2021-05-06 DIAGNOSIS — N281 Cyst of kidney, acquired: Secondary | ICD-10-CM | POA: Diagnosis not present

## 2021-05-06 DIAGNOSIS — D219 Benign neoplasm of connective and other soft tissue, unspecified: Secondary | ICD-10-CM | POA: Diagnosis not present

## 2021-05-06 LAB — COMPREHENSIVE METABOLIC PANEL
ALT: 320 U/L — ABNORMAL HIGH (ref 0–44)
AST: 297 U/L — ABNORMAL HIGH (ref 15–41)
Albumin: 4.7 g/dL (ref 3.5–5.0)
Alkaline Phosphatase: 129 U/L — ABNORMAL HIGH (ref 38–126)
Anion gap: 12 (ref 5–15)
BUN: 12 mg/dL (ref 6–20)
CO2: 22 mmol/L (ref 22–32)
Calcium: 9.6 mg/dL (ref 8.9–10.3)
Chloride: 102 mmol/L (ref 98–111)
Creatinine, Ser: 0.81 mg/dL (ref 0.44–1.00)
GFR, Estimated: >60 mL/min (ref >60)
Glucose, Bld: 219 mg/dL — ABNORMAL HIGH (ref 70–99)
Potassium: 4.5 mmol/L (ref 3.5–5.1)
Sodium: 136 mmol/L (ref 135–145)
Total Bilirubin: 0.6 mg/dL (ref 0.3–1.2)
Total Protein: 8.4 g/dL — ABNORMAL HIGH (ref 6.5–8.1)

## 2021-05-06 LAB — CBC
HCT: 37.2 % (ref 36.0–46.0)
Hemoglobin: 12.5 g/dL (ref 12.0–15.0)
MCH: 28.9 pg (ref 26.0–34.0)
MCHC: 33.6 g/dL (ref 30.0–36.0)
MCV: 86.1 fL (ref 80.0–100.0)
Platelets: 311 10*3/uL (ref 150–400)
RBC: 4.32 MIL/uL (ref 3.87–5.11)
RDW: 13.3 % (ref 11.5–15.5)
WBC: 7.1 10*3/uL (ref 4.0–10.5)
nRBC: 0 % (ref 0.0–0.2)

## 2021-05-06 LAB — URINALYSIS, ROUTINE W REFLEX MICROSCOPIC
Bilirubin Urine: NEGATIVE
Glucose, UA: NEGATIVE mg/dL
Hgb urine dipstick: NEGATIVE
Ketones, ur: NEGATIVE mg/dL
Leukocytes,Ua: NEGATIVE
Nitrite: NEGATIVE
Protein, ur: 30 mg/dL — AB
Specific Gravity, Urine: 1.023 (ref 1.005–1.030)
pH: 6 (ref 5.0–8.0)

## 2021-05-06 LAB — LIPASE, BLOOD: Lipase: 30 U/L (ref 11–51)

## 2021-05-06 LAB — PREGNANCY, URINE: Preg Test, Ur: NEGATIVE

## 2021-05-06 MED ORDER — DICYCLOMINE HCL 20 MG PO TABS: 20.0000 mg | ORAL_TABLET | Freq: Four times a day (QID) | ORAL | 0 refills | Status: DC | PRN

## 2021-05-06 MED ORDER — ONDANSETRON HCL 8 MG PO TABS: 8.0000 mg | ORAL_TABLET | Freq: Three times a day (TID) | ORAL | 1 refills | Status: DC | PRN

## 2021-05-06 MED ORDER — IOHEXOL 300 MG/ML  SOLN
100.0000 mL | Freq: Once | INTRAMUSCULAR | Status: AC | PRN
  Administered 2021-05-06: 100 mL via INTRAVENOUS

## 2021-05-06 NOTE — ED Provider Notes (Signed)
[facility] Provider Note: Kim Spurling, MD, FACEP  CSN: [id number] MRN: [id number] ARRIVAL: 05/06/21 at [place]: [place]  Abdominal Pain   HISTORY OF PRESENT ILLNESS  05/06/21 4:11 AM Kim Walsh Kim Walsh is a 36 y.o. female who has had several months of intermittent mid abdominal pain.  This pain worsened over the past 2 days and became unbearable about 1 AM today.  The pain has subsequently improved but it was a 10 out of 10 on arrival.  She describes it as a twisting or churning sensation in her upper abdomen.  It is somewhat worse with movement or palpation.  She did have 1 episode of vomiting with the pain this morning.  She denies diarrhea.  She is status postcholecystectomy.   Past Medical History:  Diagnosis Date   Congenitally short metatarsal    Bilateral    Eczema    Gastritis    Headache(784.0)    Tonsillitis     Past Surgical History:  Procedure Laterality Date   TONSILLECTOMY      Family History  Problem Relation Age of Onset   Breast cancer Maternal Grandmother    Breast cancer Maternal Aunt    Cancer Maternal Uncle        Non Hodgkins/Colon    Social History   Tobacco Use   Smoking status: Never   Smokeless tobacco: Never  Substance Use Topics   Alcohol use: Yes    Alcohol/week: 0.0 standard drinks    Comment: occassionally wine   Drug use: No    Prior to Admission medications   Medication Sig Start Date End Date Taking? Authorizing Provider  dicyclomine (BENTYL) 20 MG tablet Take 1 tablet (20 mg total) by mouth every 6 (six) hours as needed (Abdominal cramping). 05/06/21  Yes Presten Joost, MD  ondansetron (ZOFRAN) 8 MG tablet Take 1 tablet (8 mg total) by mouth every 8 (eight) hours as needed for nausea or vomiting. 05/06/21  Yes Misbah Hornaday, MD  acyclovir (ZOVIRAX) 200 MG capsule Take 2 capsules (400 mg total) by mouth 4 (four) times daily as needed (for lip swelling). 11/24/18   Tower, [name], MD  albuterol  (VENTOLIN HFA) 108 (90 Base) MCG/ACT inhaler Inhale 2 puffs into the lungs every 6 (six) hours as needed for wheezing or shortness of breath. 05/05/21   Bedsole, Amy E, MD  cholecalciferol (VITAMIN D) 25 MCG (1000 UT) tablet Take 2,000 Units by mouth daily.    [provider]  guaiFENesin-codeine (ROBITUSSIN AC) 100-10 MG/5ML syrup Take 5-10 mLs by mouth at bedtime as needed for cough. 05/05/21   Bedsole, Amy E, MD  mometasone (ELOCON) 0.1 % cream Apply topically daily. Apply to affected area for eczema once daily as needed - small amount. 11/24/18   Tower, [name], MD  omeprazole (PRILOSEC) 20 MG capsule Take 1 capsule (20 mg total) by mouth 2 (two) times daily. 11/26/18   Tower, [name], MD  SUMAtriptan (IMITREX) 100 MG tablet Take 1 tablet (100 mg total) by mouth every 2 (two) hours as needed for migraine. May repeat in 2 hours if headache persists or recurs. 11/24/18   Tower, [name], MD    Allergies Shrimp [shellfish allergy], Sulfa antibiotics, Pseudoephedrine, and Tramadol   REVIEW OF SYSTEMS  Negative except as noted here or in the History of Present Illness.   PHYSICAL EXAMINATION  Initial Vital Signs Blood pressure 125/80, pulse 99, temperature 97.9 F (36.6 C), temperature source Oral, resp. rate  20, height 5\' 4"  (1.626 m), weight 80.7 kg, last menstrual period 04/18/2021, SpO2 99 %.  Examination General: Well-developed, well-nourished female in no acute distress; appearance consistent with age of record HENT: normocephalic; atraumatic Eyes: Normal appearance Neck: supple Heart: regular rate and rhythm Lungs: clear to auscultation bilaterally Abdomen: soft; nondistended; minimal right upper quadrant tenderness; no masses or hepatosplenomegaly; bowel sounds present Extremities: No deformity; full range of motion; pulses normal Neurologic: Awake, alert and oriented; motor function intact in all extremities and symmetric; no facial droop Skin: Warm and dry Psychiatric:  Normal mood and affect   RESULTS  Summary of this visit's results, reviewed and interpreted by myself:   EKG Interpretation  Date/Time:    Ventricular Rate:    PR Interval:    QRS Duration:   QT Interval:    QTC Calculation:   R Axis:     Text Interpretation:         Laboratory Studies: Results for orders placed or performed during the hospital encounter of 05/06/21 (from the past 24 hour(s))  Lipase, blood     Status: None   Collection Time: 05/06/21  3:45 AM  Result Value Ref Range   Lipase 30 11 - 51 U/L  Comprehensive metabolic panel     Status: Abnormal   Collection Time: 05/06/21  3:45 AM  Result Value Ref Range   Sodium 136 135 - 145 mmol/L   Potassium 4.5 3.5 - 5.1 mmol/L   Chloride 102 98 - 111 mmol/L   CO2 22 22 - 32 mmol/L   Glucose, Bld 219 (H) 70 - 99 mg/dL   BUN 12 6 - 20 mg/dL   Creatinine, Ser 0.81 0.44 - 1.00 mg/dL   Calcium 9.6 8.9 - 10.3 mg/dL   Total Protein 8.4 (H) 6.5 - 8.1 g/dL   Albumin 4.7 3.5 - 5.0 g/dL   AST 297 (H) 15 - 41 U/L   ALT 320 (H) 0 - 44 U/L   Alkaline Phosphatase 129 (H) 38 - 126 U/L   Total Bilirubin 0.6 0.3 - 1.2 mg/dL   GFR, Estimated >60 >60 mL/min   Anion gap 12 5 - 15  CBC     Status: None   Collection Time: 05/06/21  3:45 AM  Result Value Ref Range   WBC 7.1 4.0 - 10.5 K/uL   RBC 4.32 3.87 - 5.11 MIL/uL   Hemoglobin 12.5 12.0 - 15.0 g/dL   HCT 37.2 36.0 - 46.0 %   MCV 86.1 80.0 - 100.0 fL   MCH 28.9 26.0 - 34.0 pg   MCHC 33.6 30.0 - 36.0 g/dL   RDW 13.3 11.5 - 15.5 %   Platelets 311 150 - 400 K/uL   nRBC 0.0 0.0 - 0.2 %  Urinalysis, Routine w reflex microscopic Urine, Clean Catch     Status: Abnormal   Collection Time: 05/06/21  3:45 AM  Result Value Ref Range   Color, Urine YELLOW YELLOW   APPearance CLEAR CLEAR   Specific Gravity, Urine 1.023 1.005 - 1.030   pH 6.0 5.0 - 8.0   Glucose, UA NEGATIVE NEGATIVE mg/dL   Hgb urine dipstick NEGATIVE NEGATIVE   Bilirubin Urine NEGATIVE NEGATIVE   Ketones, ur  NEGATIVE NEGATIVE mg/dL   Protein, ur 30 (A) NEGATIVE mg/dL   Nitrite NEGATIVE NEGATIVE   Leukocytes,Ua NEGATIVE NEGATIVE   RBC / HPF 0-5 0 - 5 RBC/hpf   WBC, UA 0-5 0 - 5 WBC/hpf   Bacteria, UA RARE (A) NONE SEEN  Squamous Epithelial / LPF 0-5 0 - 5   Mucus PRESENT   Pregnancy, urine     Status: None   Collection Time: 05/06/21  3:45 AM  Result Value Ref Range   Preg Test, Ur NEGATIVE NEGATIVE   Imaging Studies: CT ABDOMEN PELVIS W CONTRAST  Result Date: 05/06/2021 CLINICAL DATA:  Worsening abdominal pain over the last 2 days. EXAM: CT ABDOMEN AND PELVIS WITH CONTRAST TECHNIQUE: Multidetector CT imaging of the abdomen and pelvis was performed using the standard protocol following bolus administration of intravenous contrast. RADIATION DOSE REDUCTION: This exam was performed according to the departmental dose-optimization program which includes automated exposure control, adjustment of the mA and/or kV according to patient size and/or use of iterative reconstruction technique. CONTRAST:  160mL OMNIPAQUE IOHEXOL 300 MG/ML  SOLN COMPARISON:  None. FINDINGS: Lower chest: Nodular patchy airspace disease is identified in the right lower lobe (see image 10 of series 4) compatible with pneumonia. Left lung base clear. Hepatobiliary: No suspicious focal abnormality within the liver parenchyma. Nonvisualization of the gallbladder is compatible with decompressed state or surgical absence. No intrahepatic or extrahepatic biliary dilation. Pancreas: No focal mass lesion. No dilatation of the main duct. No intraparenchymal cyst. No peripancreatic edema. Spleen: No splenomegaly. No focal mass lesion. Adrenals/Urinary Tract: No adrenal nodule or mass. 14 mm low-density lesion in the upper pole right kidney (29/2) has attenuation higher than would be expected for a simple cyst. There may be some internal septation/architecture within the lesion (see image 30/2). Tiny hypodensity in the lower pole left kidney is too  small to characterize but likely benign. No evidence for hydroureter. The urinary bladder appears normal for the degree of distention. Stomach/Bowel: Stomach is unremarkable. No gastric wall thickening. No evidence of outlet obstruction. Duodenum is normally positioned as is the ligament of Treitz. No small bowel wall thickening. No small bowel dilatation. The terminal ileum is normal. The appendix is normal. No gross colonic mass. No colonic wall thickening. Vascular/Lymphatic: No abdominal aortic aneurysm. No abdominal lymphadenopathy. No pelvic sidewall lymphadenopathy. Reproductive: Small calcified uterine fibroids evident. There is no adnexal mass. Other: Trace free fluid noted in the cul-de-sac. This can be physiologic in premenopausal female. Musculoskeletal: No worrisome lytic or sclerotic osseous abnormality. IMPRESSION: 1. No acute findings in the abdomen or pelvis. 2. Nodular patchy airspace disease in the right lower lobe compatible with pneumonia. 3. 14 mm low-density lesion in the upper pole right kidney has attenuation higher than would be expected for a simple cyst. There may be some internal septation/architecture within the lesion. Given patient age, this is most likely a cyst with internal septation, but follow-up nonemergent outpatient MRI with and without contrast recommended to confirm. 4. Small calcified uterine fibroids. Electronically Signed   By: Misty Stanley M.D.   On: 05/06/2021 05:17    ED COURSE and MDM  Nursing notes, initial and subsequent vitals signs, including pulse oximetry, reviewed and interpreted by myself.  Vitals:   05/06/21 0334 05/06/21 0335 05/06/21 0435 05/06/21 0500  BP: 125/80  126/79 116/71  Pulse: 99  89 87  Resp: 20  17 16   Temp: 97.9 F (36.6 C)     TempSrc: Oral     SpO2: 99%  97% 98%  Weight:  80.7 kg    Height:  5\' 4"  (1.626 m)     Medications  iohexol (OMNIPAQUE) 300 MG/ML solution 100 mL (100 mLs Intravenous Contrast Given 05/06/21 0445)    5:24 AM CT and CT reading reviewed  by myself.  The patient's clinical presentation is not consistent with pneumonia and I suspect the lung findings are more consistent with atelectasis or scarring.  Her abdominal pain may be due to IBS (she does have a history of this).  Intermittent, chronic abdominal pain is often difficult to diagnose especially in the emergency room setting.  We will prescribe Bentyl for symptomatic treatment and refer back to her physician.  She was advised of the kidney cyst and the need to have that followed up by her physician as well.   PROCEDURES  Procedures   ED DIAGNOSES     ICD-10-CM   1. Recurrent abdominal pain  R10.9     2. Fibroids  D21.9     3. Cyst of right kidney  N28.1          Jamarria Real, Jenny Reichmann, MD 05/06/21 [phone]

## 2021-05-06 NOTE — Discharge Instructions (Addendum)
You have a cyst on your right kidney that is difficult to completely diagnose based on CT scan.  Please discuss this with your primary care physician regarding possible follow-up MRI for further evaluation.

## 2021-05-06 NOTE — ED Triage Notes (Signed)
Mid abdominal pain starting months ago, but noticed it has become worse over the 2 days and unbearable since around 1 am.days ago intermittently. Denies urinary symptoms.  Does become nauseated when pain is intense.

## 2021-05-07 ENCOUNTER — Encounter: Payer: Self-pay | Admitting: Family Medicine

## 2021-05-07 ENCOUNTER — Other Ambulatory Visit: Payer: Self-pay | Admitting: Family Medicine

## 2021-05-07 ENCOUNTER — Ambulatory Visit (INDEPENDENT_AMBULATORY_CARE_PROVIDER_SITE_OTHER): Payer: 59 | Admitting: Family Medicine

## 2021-05-07 ENCOUNTER — Telehealth: Payer: Self-pay | Admitting: *Deleted

## 2021-05-07 ENCOUNTER — Ambulatory Visit (INDEPENDENT_AMBULATORY_CARE_PROVIDER_SITE_OTHER)
Admission: RE | Admit: 2021-05-07 | Discharge: 2021-05-07 | Disposition: A | Discharge status: Home / Self Care | Payer: 59 | Source: Ambulatory Visit | Attending: Family Medicine | Admitting: Family Medicine

## 2021-05-07 VITALS — BP 112/68 | HR 56 | Temp 97.1°F | Ht 64.0 in | Wt 178.0 lb

## 2021-05-07 DIAGNOSIS — R7989 Other specified abnormal findings of blood chemistry: Secondary | ICD-10-CM | POA: Diagnosis not present

## 2021-05-07 DIAGNOSIS — R058 Other specified cough: Secondary | ICD-10-CM

## 2021-05-07 DIAGNOSIS — N289 Disorder of kidney and ureter, unspecified: Secondary | ICD-10-CM

## 2021-05-07 DIAGNOSIS — R1013 Epigastric pain: Secondary | ICD-10-CM

## 2021-05-07 DIAGNOSIS — R109 Unspecified abdominal pain: Secondary | ICD-10-CM | POA: Insufficient documentation

## 2021-05-07 DIAGNOSIS — F4321 Adjustment disorder with depressed mood: Secondary | ICD-10-CM

## 2021-05-07 DIAGNOSIS — R1033 Periumbilical pain: Secondary | ICD-10-CM | POA: Diagnosis not present

## 2021-05-07 LAB — HEPATIC FUNCTION PANEL
ALT: 407 U/L — ABNORMAL HIGH (ref 0–35)
AST: 127 U/L — ABNORMAL HIGH (ref 0–37)
Albumin: 4.5 g/dL (ref 3.5–5.2)
Alkaline Phosphatase: 135 U/L — ABNORMAL HIGH (ref 39–117)
Bilirubin, Direct: 0 mg/dL (ref 0.0–0.3)
Total Bilirubin: 0.4 mg/dL (ref 0.2–1.2)
Total Protein: 8 g/dL (ref 6.0–8.3)

## 2021-05-07 LAB — BASIC METABOLIC PANEL
BUN: 16 mg/dL (ref 6–23)
CO2: 31 mEq/L (ref 19–32)
Calcium: 10 mg/dL (ref 8.4–10.5)
Chloride: 104 mEq/L (ref 96–112)
Creatinine, Ser: 0.98 mg/dL (ref 0.40–1.20)
GFR: 75.3 mL/min (ref >60.00)
Glucose, Bld: 104 mg/dL — ABNORMAL HIGH (ref 70–99)
Potassium: 4 mEq/L (ref 3.5–5.1)
Sodium: 140 mEq/L (ref 135–145)

## 2021-05-07 MED ORDER — DOXYCYCLINE HYCLATE 100 MG PO TABS: 100.0000 mg | ORAL_TABLET | Freq: Two times a day (BID) | ORAL | 0 refills | Status: DC

## 2021-05-07 MED ORDER — OMEPRAZOLE 20 MG PO CPDR: 20.0000 mg | DELAYED_RELEASE_CAPSULE | Freq: Two times a day (BID) | ORAL | 11 refills | Status: DC

## 2021-05-07 NOTE — Assessment & Plan Note (Signed)
14 mm upper pole of R kidney on CT Of note a 1.5 cm cyst in R kidney seen on [place] in 2018-suspect this is ths same

## 2021-05-07 NOTE — Telephone Encounter (Signed)
See pharmacy notes in Rx. Insurance only covers one pill a day. They suggest changing Rx from 20 BID to 40 mg qd. Please advise

## 2021-05-07 NOTE — Progress Notes (Signed)
Subjective:    Patient ID: Kim Walsh, female    DOB: 01-25-1986, 36 y.o.   MRN: [id number]  This visit occurred during the SARS-CoV-2 public health emergency.  Safety protocols were in place, including screening questions prior to the visit, additional usage of staff PPE, and extensive cleaning of exam room while observing appropriate contact time as indicated for disinfecting solutions.   HPI Pt presents for f/u of ER visit for abd pain   Wt Readings from Last 3 Encounters:  05/07/21 178 lb (80.7 kg)  05/06/21 178 lb (80.7 kg)  05/05/21 178 lb (80.7 kg)   30.55 kg/m   She presented to ER yesterday for mid abd pain that has been intermittent for several months  Was 10/10 on presentation and then improved  One episode of vomiting the am prior  Results for orders placed or performed during the hospital encounter of 05/06/21  Lipase, blood  Result Value Ref Range   Lipase 30 11 - 51 U/L  Comprehensive metabolic panel  Result Value Ref Range   Sodium 136 135 - 145 mmol/L   Potassium 4.5 3.5 - 5.1 mmol/L   Chloride 102 98 - 111 mmol/L   CO2 22 22 - 32 mmol/L   Glucose, Bld 219 (H) 70 - 99 mg/dL   BUN 12 6 - 20 mg/dL   Creatinine, Ser 0.81 0.44 - 1.00 mg/dL   Calcium 9.6 8.9 - 10.3 mg/dL   Total Protein 8.4 (H) 6.5 - 8.1 g/dL   Albumin 4.7 3.5 - 5.0 g/dL   AST 297 (H) 15 - 41 U/L   ALT 320 (H) 0 - 44 U/L   Alkaline Phosphatase 129 (H) 38 - 126 U/L   Total Bilirubin 0.6 0.3 - 1.2 mg/dL   GFR, Estimated >60 >60 mL/min   Anion gap 12 5 - 15  CBC  Result Value Ref Range   WBC 7.1 4.0 - 10.5 K/uL   RBC 4.32 3.87 - 5.11 MIL/uL   Hemoglobin 12.5 12.0 - 15.0 g/dL   HCT 37.2 36.0 - 46.0 %   MCV 86.1 80.0 - 100.0 fL   MCH 28.9 26.0 - 34.0 pg   MCHC 33.6 30.0 - 36.0 g/dL   RDW 13.3 11.5 - 15.5 %   Platelets 311 150 - 400 K/uL   nRBC 0.0 0.0 - 0.2 %  Urinalysis, Routine w reflex microscopic Urine, Clean Catch  Result Value Ref Range   Color, Urine YELLOW YELLOW    APPearance CLEAR CLEAR   Specific Gravity, Urine 1.023 1.005 - 1.030   pH 6.0 5.0 - 8.0   Glucose, UA NEGATIVE NEGATIVE mg/dL   Hgb urine dipstick NEGATIVE NEGATIVE   Bilirubin Urine NEGATIVE NEGATIVE   Ketones, ur NEGATIVE NEGATIVE mg/dL   Protein, ur 30 (A) NEGATIVE mg/dL   Nitrite NEGATIVE NEGATIVE   Leukocytes,Ua NEGATIVE NEGATIVE   RBC / HPF 0-5 0 - 5 RBC/hpf   WBC, UA 0-5 0 - 5 WBC/hpf   Bacteria, UA RARE (A) NONE SEEN   Squamous Epithelial / LPF 0-5 0 - 5   Mucus PRESENT   Pregnancy, urine  Result Value Ref Range   Preg Test, Ur NEGATIVE NEGATIVE    Lab noted elevated LFT but nl lipase  CT scan CT ABDOMEN PELVIS W CONTRAST  Result Date: 05/06/2021 CLINICAL DATA:  Worsening abdominal pain over the last 2 days. EXAM: CT ABDOMEN AND PELVIS WITH CONTRAST TECHNIQUE: Multidetector CT imaging of the abdomen and pelvis was performed  using the standard protocol following bolus administration of intravenous contrast. RADIATION DOSE REDUCTION: This exam was performed according to the departmental dose-optimization program which includes automated exposure control, adjustment of the mA and/or kV according to patient size and/or use of iterative reconstruction technique. CONTRAST:  137mL OMNIPAQUE IOHEXOL 300 MG/ML  SOLN COMPARISON:  None. FINDINGS: Lower chest: Nodular patchy airspace disease is identified in the right lower lobe (see image 10 of series 4) compatible with pneumonia. Left lung base clear. Hepatobiliary: No suspicious focal abnormality within the liver parenchyma. Nonvisualization of the gallbladder is compatible with decompressed state or surgical absence. No intrahepatic or extrahepatic biliary dilation. Pancreas: No focal mass lesion. No dilatation of the main duct. No intraparenchymal cyst. No peripancreatic edema. Spleen: No splenomegaly. No focal mass lesion. Adrenals/Urinary Tract: No adrenal nodule or mass. 14 mm low-density lesion in the upper pole right kidney (29/2) has  attenuation higher than would be expected for a simple cyst. There may be some internal septation/architecture within the lesion (see image 30/2). Tiny hypodensity in the lower pole left kidney is too small to characterize but likely benign. No evidence for hydroureter. The urinary bladder appears normal for the degree of distention. Stomach/Bowel: Stomach is unremarkable. No gastric wall thickening. No evidence of outlet obstruction. Duodenum is normally positioned as is the ligament of Treitz. No small bowel wall thickening. No small bowel dilatation. The terminal ileum is normal. The appendix is normal. No gross colonic mass. No colonic wall thickening. Vascular/Lymphatic: No abdominal aortic aneurysm. No abdominal lymphadenopathy. No pelvic sidewall lymphadenopathy. Reproductive: Small calcified uterine fibroids evident. There is no adnexal mass. Other: Trace free fluid noted in the cul-de-sac. This can be physiologic in premenopausal female. Musculoskeletal: No worrisome lytic or sclerotic osseous abnormality. IMPRESSION: 1. No acute findings in the abdomen or pelvis. 2. Nodular patchy airspace disease in the right lower lobe compatible with pneumonia. 3. 14 mm low-density lesion in the upper pole right kidney has attenuation higher than would be expected for a simple cyst. There may be some internal septation/architecture within the lesion. Given patient age, this is most likely a cyst with internal septation, but follow-up nonemergent outpatient MRI with and without contrast recommended to confirm. 4. Small calcified uterine fibroids. Electronically Signed   By: Misty Stanley M.D.   On: 05/06/2021 05:17     Of note- 1.5 cm renal cyst was seen on abd [place] in the past along with gb polyp  That report: US Abdomen Limited RUQ (Accession [id number]) (Order [id number]) Imaging Date: 05/14/2016 Department: [facility] Released By: Garth Bigness D Authorizing: Briscoe Deutscher, DO    Exam Status  Status  Final [99]   PACS Intelerad Image Link   Show images for US Abdomen Limited RUQ  Study Result  Narrative & Impression  CLINICAL DATA:  Right upper quadrant pain and nausea.   EXAM: US ABDOMEN LIMITED - RIGHT UPPER QUADRANT   COMPARISON:  None.   FINDINGS: Gallbladder:   There is a 5 mm polyp in the gallbladder. No stones, sludge, wall thickening, or pericholecystic fluid.   Common bile duct:   Diameter: 3.7 mm   Liver:   No focal lesion identified. Within normal limits in parenchymal echogenicity.   IMPRESSION: 1. 5 mm polyp in the gallbladder. 2. Incidental 1.5 cm right renal cyst.    Had ccy and gb polyp got bigger before that    She also had HIDA scan in 2019 that was normal H pylori test then  also negative  Ccy done    From ER note AM CT and CT reading reviewed by myself.  The patient's clinical presentation is not consistent with pneumonia and I suspect the lung findings are more consistent with atelectasis or scarring.  Her abdominal pain may be due to IBS (she does have a history of this).  Intermittent, chronic abdominal pain is often difficult to diagnose especially in the emergency room setting.  We will prescribe Bentyl for symptomatic treatment and refer back to her physician.  She was advised of the kidney cyst and the need to have that followed up by her physician as well.   Pt takes omeprazole bid for GERD  Feels better now  The bentyl helps Just one episode since taking it  Taking it atc for now   Episodes last 2 hours at most   She got some digestive enzyme tabs with ca carbonate -took them a lot   Did feel like she was wheezing and some cough and congestion  Has done covid testing  Was put on prednisone and cough med   Not taking tylenol  No alcohol   Low back hurts   Had a GI virus in mid jan with diarrhea  That is better  Went around the day care  No jaundice   Lost mom- GI cancer  Lots of loss  in family recently   Struggling with grief/anx Did some counseling before her mom got sick -stopped when she lost ins Now she called to get re est with counselor ([facility])   Patient Active Problem List   Diagnosis Date Noted   Kidney lesion, native, right 05/07/2021   Abdominal pain 05/07/2021   Productive cough 05/07/2021   Elevated LFTs 05/07/2021   Grief reaction 05/07/2021   Acute cough 05/05/2021   Fibroid 11/28/2018   Heavy menses 11/24/2018   Lip swelling 11/24/2018   Lower thoracic back pain 01/11/2017   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   Plantar fasciitis, bilateral 03/13/2015   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   Routine general medical examination at a health care facility 03/06/2012   Atopic dermatitis 09/07/2010   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   Change in hearing 07/16/2010   HSV 08/02/2006   SICKLE-CELL TRAIT 08/02/2006   Eczema 08/02/2006   Migraine 08/02/2006   ANA POSITIVE 08/02/2006   Past Medical History:  Diagnosis Date   Congenitally short metatarsal    Bilateral    Eczema    Gastritis    Headache(784.0)    Tonsillitis    Past Surgical History:  Procedure Laterality Date   TONSILLECTOMY     Social History   Tobacco Use   Smoking status: Never   Smokeless tobacco: Never  Substance Use Topics   Alcohol use: Yes    Alcohol/week: 0.0 standard drinks    Comment: occassionally wine   Drug use: No   Family History  Problem Relation Age of Onset   Breast cancer Maternal Grandmother    Breast cancer Maternal Aunt    Cancer Maternal Uncle        Non Hodgkins/Colon   Allergies  Allergen Reactions   Shrimp [Shellfish Allergy] Swelling   Sulfa Antibiotics    Pseudoephedrine Itching and Rash    REACTION: face itched, rash   Tramadol Nausea Only    DIZZINESS AND FAINT FEELING   Current Outpatient Medications on File Prior to Visit  Medication Sig Dispense Refill   acyclovir (ZOVIRAX) 200 MG capsule Take 2 capsules (400 mg total) by mouth 4 (  four)  times daily as needed (for lip swelling). 30 capsule 5   albuterol (VENTOLIN HFA) 108 (90 Base) MCG/ACT inhaler Inhale 2 puffs into the lungs every 6 (six) hours as needed for wheezing or shortness of breath. 8 g 0   cholecalciferol (VITAMIN D) 25 MCG (1000 UT) tablet Take 2,000 Units by mouth daily.     dicyclomine (BENTYL) 20 MG tablet Take 1 tablet (20 mg total) by mouth every 6 (six) hours as needed (Abdominal cramping). 20 tablet 0   guaiFENesin-codeine (ROBITUSSIN AC) 100-10 MG/5ML syrup Take 5-10 mLs by mouth at bedtime as needed for cough. 180 mL 0   mometasone (ELOCON) 0.1 % cream Apply topically daily. Apply to affected area for eczema once daily as needed - small amount. 45 g 3   ondansetron (ZOFRAN) 8 MG tablet Take 1 tablet (8 mg total) by mouth every 8 (eight) hours as needed for nausea or vomiting. 10 tablet 1   SUMAtriptan (IMITREX) 100 MG tablet Take 1 tablet (100 mg total) by mouth every 2 (two) hours as needed for migraine. May repeat in 2 hours if headache persists or recurs. 10 tablet 11   No current facility-administered medications on file prior to visit.    Review of Systems  Constitutional:  Positive for fatigue. Negative for activity change, appetite change, fever and unexpected weight change.  HENT:  Negative for congestion, ear pain, rhinorrhea, sinus pressure and sore throat.   Eyes:  Negative for pain, redness and visual disturbance.  Respiratory:  Positive for cough. Negative for shortness of breath, wheezing and stridor.   Cardiovascular:  Negative for chest pain and palpitations.  Gastrointestinal:  Positive for abdominal pain and nausea. Negative for abdominal distention, anal bleeding, blood in stool, constipation, diarrhea, rectal pain and vomiting.  Endocrine: Negative for polydipsia and polyuria.  Genitourinary:  Negative for dysuria, frequency and urgency.  Musculoskeletal:  Negative for arthralgias, back pain and myalgias.  Skin:  Negative for pallor and  rash.  Allergic/Immunologic: Negative for environmental allergies.  Neurological:  Negative for dizziness, syncope and headaches.  Hematological:  Negative for adenopathy. Does not bruise/bleed easily.  Psychiatric/Behavioral:  Negative for decreased concentration and dysphoric mood. The patient is nervous/anxious.       Objective:   Physical Exam Constitutional:      General: She is not in acute distress.    Appearance: Normal appearance. She is well-developed. She is obese. She is not ill-appearing.  HENT:     Head: Normocephalic and atraumatic.     Mouth/Throat:     Mouth: Mucous membranes are moist.     Pharynx: Oropharynx is clear.  Eyes:     General: No scleral icterus.       Right eye: No discharge.        Left eye: No discharge.     Conjunctiva/sclera: Conjunctivae normal.     Pupils: Pupils are equal, round, and reactive to light.  Neck:     Thyroid: No thyromegaly.     Vascular: No carotid bruit or JVD.  Cardiovascular:     Rate and Rhythm: Regular rhythm. Bradycardia present.     Heart sounds: Normal heart sounds.    No gallop.  Pulmonary:     Effort: Pulmonary effort is normal. No respiratory distress.     Breath sounds: Rhonchi present. No wheezing or rales.     Comments: Good air exch Wheeze on forced exp only   No rales Few scattered rhonchi Cough sounds wet Abdominal:  General: Abdomen is flat. Bowel sounds are normal. There is no distension or abdominal bruit.     Palpations: Abdomen is soft. There is no hepatomegaly, splenomegaly or mass.     Tenderness: There is abdominal tenderness in the periumbilical area. There is no right CVA tenderness, left CVA tenderness, guarding or rebound. Negative signs include Murphy's sign and McBurney's sign.     Comments: No pain with movement of table  Musculoskeletal:     Cervical back: Normal range of motion and neck supple.     Right lower leg: No edema.     Left lower leg: No edema.  Lymphadenopathy:      Cervical: No cervical adenopathy.  Skin:    General: Skin is warm and dry.     Coloration: Skin is not jaundiced or pale.     Findings: No rash.  Neurological:     Mental Status: She is alert.     Coordination: Coordination normal.     Deep Tendon Reflexes: Reflexes are normal and symmetric. Reflexes normal.  Psychiatric:        Mood and Affect: Mood is anxious. Affect is tearful.     Comments: Mildly anxious  Tearful at times           Assessment & Plan:   Problem List Items Addressed This Visit       Other   Abdominal pain - Primary    Peri umbilical pain, intermittent, this time with elevated LFTs Seen in ER yesterday Reviewed hospital records, lab results and studies in detail  Reviewed old imaging from 2018, and noted gall bladder removed with polyp in the past Pain is improved today  Using bentyl Disc differential incl gastritis, IBs, CBD stone inst to start back on omeprazole  Eat bland and avoid fatty food  Continue bentyl  Lab today for repeat lft and hepatitis panel  May need to consider GI ref      Relevant Orders   Acute Hep Panel & Hep B Surface Ab   Hepatic function panel   Basic metabolic panel   Elevated LFTs   Relevant Orders   Acute Hep Panel & Hep B Surface Ab   Hepatic function panel   Grief reaction    Loss of mother to stomach cancer  Feeling anxious  Reviewed stressors/ coping techniques/symptoms/ support sources/ tx options and side effects in detail today She plans to get set back up with counselor   May consider medication for anxiety symptoms in the future after GI problems are diagnosed       Kidney lesion, native, right    14 mm upper pole of R kidney on CT Of note a 1.5 cm cyst in R kidney seen on [place] in 2018-suspect this is ths same       Productive cough    Patchy airspace change noted on CT RLL  This is confirmed on cxr (nothing more than this) Will tx for possible pna with doxycycline  Update if not starting to improve  in a week or if worsening  ER precautions reviewed       Relevant Orders   DG Chest 2 View (Completed)

## 2021-05-07 NOTE — Assessment & Plan Note (Signed)
Peri umbilical pain, intermittent, this time with elevated LFTs Seen in ER yesterday Reviewed hospital records, lab results and studies in detail  Reviewed old imaging from 2018, and noted gall bladder removed with polyp in the past Pain is improved today  Using bentyl Disc differential incl gastritis, IBs, CBD stone inst to start back on omeprazole  Eat bland and avoid fatty food  Continue bentyl  Lab today for repeat lft and hepatitis panel  May need to consider GI ref

## 2021-05-07 NOTE — Patient Instructions (Addendum)
Continue the bentyl  Get back on omeprazole  Eat bland  Avoid fatty foods  Drink fluids   Lab today for liver/hepatitis screen  Chest xray today   If symptoms suddenly worsen   Get in contact with your counselor

## 2021-05-07 NOTE — Assessment & Plan Note (Signed)
Loss of mother to stomach cancer  Feeling anxious  Reviewed stressors/ coping techniques/symptoms/ support sources/ tx options and side effects in detail today She plans to get set back up with counselor   May consider medication for anxiety symptoms in the future after GI problems are diagnosed

## 2021-05-07 NOTE — Telephone Encounter (Signed)
Left VM requesting pt to call the office back 

## 2021-05-07 NOTE — Telephone Encounter (Signed)
-----   Message from [name], MD sent at 05/07/2021 12:41 PM EST ----- Possible pneumonia confirmed on cxr in the same area   (no new or other areas)  I sent doxycyline to her pharmacy to start on  Keep [place] posted re: cough

## 2021-05-07 NOTE — Assessment & Plan Note (Signed)
Patchy airspace change noted on CT RLL  This is confirmed on cxr (nothing more than this) Will tx for possible pna with doxycycline  Update if not starting to improve in a week or if worsening  ER precautions reviewed

## 2021-05-07 NOTE — Telephone Encounter (Signed)
Addressed through result notes  

## 2021-05-11 ENCOUNTER — Telehealth: Payer: Self-pay | Admitting: Family Medicine

## 2021-05-11 DIAGNOSIS — R1033 Periumbilical pain: Secondary | ICD-10-CM

## 2021-05-11 DIAGNOSIS — R7989 Other specified abnormal findings of blood chemistry: Secondary | ICD-10-CM

## 2021-05-11 LAB — ACUTE HEP PANEL AND HEP B SURFACE AB
HEPATITIS C ANTIBODY REFILL$(REFL): NONREACTIVE
Hep A IgM: NONREACTIVE
Hep B C IgM: NONREACTIVE
Hepatitis B Surface Ag: NONREACTIVE
SIGNAL TO CUT-OFF: 0.06 (ref <1.00)

## 2021-05-11 LAB — REFLEX TIQ

## 2021-05-11 NOTE — Telephone Encounter (Signed)
I placed the referral and made it urgent It may still take a while to get in  Call us in 2-3 days if she does not hear back   Continue current medicines Let me know if pain is worsening or not improving  I would like to check lab again this week to monitor the liver tests

## 2021-05-11 NOTE — Telephone Encounter (Signed)
-----   Message from Kim Walsh, [place] sent at 05/11/2021  4:28 PM EST ----- Pt notified of lab results and Dr. Marliss Coots comments. Pt does want to proceed with GI referral, she would like to see someone in [place] if possible.

## 2021-05-11 NOTE — Telephone Encounter (Signed)
Left VM letting pt know Dr. Marliss Coots comments and requested pt to call back to schedule lab appt

## 2021-06-24 ENCOUNTER — Encounter: Payer: Self-pay | Admitting: Family Medicine

## 2021-06-24 ENCOUNTER — Other Ambulatory Visit: Payer: Self-pay

## 2021-06-24 ENCOUNTER — Ambulatory Visit (INDEPENDENT_AMBULATORY_CARE_PROVIDER_SITE_OTHER): Payer: 59 | Admitting: Family Medicine

## 2021-06-24 VITALS — BP 118/70 | HR 73 | Temp 97.7°F | Ht 63.0 in | Wt 178.1 lb

## 2021-06-24 DIAGNOSIS — L729 Follicular cyst of the skin and subcutaneous tissue, unspecified: Secondary | ICD-10-CM

## 2021-06-24 DIAGNOSIS — R1013 Epigastric pain: Secondary | ICD-10-CM | POA: Diagnosis not present

## 2021-06-24 DIAGNOSIS — R7989 Other specified abnormal findings of blood chemistry: Secondary | ICD-10-CM

## 2021-06-24 LAB — HEPATIC FUNCTION PANEL
ALT: 14 U/L (ref 0–35)
AST: 14 U/L (ref 0–37)
Albumin: 4.4 g/dL (ref 3.5–5.2)
Alkaline Phosphatase: 61 U/L (ref 39–117)
Bilirubin, Direct: 0.1 mg/dL (ref 0.0–0.3)
Total Bilirubin: 0.7 mg/dL (ref 0.2–1.2)
Total Protein: 6.8 g/dL (ref 6.0–8.3)

## 2021-06-24 MED ORDER — OMEPRAZOLE 40 MG PO CPDR: 40.0000 mg | DELAYED_RELEASE_CAPSULE | Freq: Every day | ORAL | 1 refills | Status: DC

## 2021-06-24 MED ORDER — DICYCLOMINE HCL 20 MG PO TABS: 20.0000 mg | ORAL_TABLET | Freq: Four times a day (QID) | ORAL | 2 refills | Status: DC | PRN

## 2021-06-24 NOTE — Assessment & Plan Note (Signed)
Re check today  ?abd symptoms are improved  ?

## 2021-06-24 NOTE — Assessment & Plan Note (Signed)
Under the inner corner of her left breast with some scarring, appears to be superficial like a cyst that was recently drained and slightly tender ?No erythema or signs of infection ?Discussed care with warm compresses, keep clean with soap and water ?Plan referral to dermatology to evaluate further and possibly remove ?Update if not starting to improve in a week or if worsening    See avs ?

## 2021-06-24 NOTE — Patient Instructions (Signed)
I put a referral for dermatology in  ?If you don't hear in 2 weeks please call us  ? ?Use a warm compress on the affected area  ?Keep clean with soap and water  ?If it drains-keep clean and covered  ?If red/swelling or increase in pain let us know  ? ? ?Call and get your GI appt as planned  ?Labs today  ?

## 2021-06-24 NOTE — Assessment & Plan Note (Signed)
Much improved with omeprazole 40 mg and bentyl prn  ?Planning GI ref in light of elevated LFTs ?

## 2021-06-24 NOTE — Progress Notes (Signed)
? ?Subjective:  ? ? Patient ID: Kim Walsh, female    DOB: [birth year], 36 y.o.   MRN: [id number] ? ?This visit occurred during the SARS-CoV-2 public health emergency.  Safety protocols were in place, including screening questions prior to the visit, additional usage of staff PPE, and extensive cleaning of exam room while observing appropriate contact time as indicated for disinfecting solutions.  ? ?HPI ?Pt presents for lump under L breast  ? ?Wt Readings from Last 3 Encounters:  ?06/24/21 178 lb 2 oz (80.8 kg)  ?05/07/21 178 lb (80.7 kg)  ?05/06/21 178 lb (80.7 kg)  ? ?31.55 kg/m? ? ?Knot under L breast in skin  ?Towards the middle  ?Over a month  ?Is sore  ?Not draining  ? ? ?Has had something similar before/skin cyst and it was removed in the past (other side)  ? ? ?M aunt and MGM had breast cancer  ? ?Due for re check of liver enzymes ?Abd pain is much better  ?Planning to call for her GI appt  ? ?Patient Active Problem List  ? Diagnosis Date Noted  ? Cyst of skin 06/24/2021  ? Kidney lesion, native, right 05/07/2021  ? Abdominal pain 05/07/2021  ? Productive cough 05/07/2021  ? Elevated LFTs 05/07/2021  ? Grief reaction 05/07/2021  ? Acute cough 05/05/2021  ? Fibroid 11/28/2018  ? Heavy menses 11/24/2018  ? Lip swelling 11/24/2018  ? Lower thoracic back pain 01/11/2017  ? 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  ? Plantar fasciitis, bilateral 03/13/2015  ? 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  ? Routine general medical examination at a health care facility 03/06/2012  ? Atopic dermatitis 09/07/2010  ? 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 Change in hearing 07/16/2010  ? 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 SICKLE-CELL TRAIT 08/02/2006  ? Eczema 08/02/2006  ? Migraine 08/02/2006  ? ANA POSITIVE 08/02/2006  ? ?Past Medical History:  ?Diagnosis Date  ? 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 Eczema   ? Gastritis   ? Headache(784.0)   ? Tonsillitis   ? ?Past Surgical History:  ?Procedure Laterality Date  ? TONSILLECTOMY    ? ?Social History   ? ?Tobacco Use  ? Smoking status: Never  ? Smokeless tobacco: Never  ?Substance Use Topics  ? Alcohol use: Yes  ?  Alcohol/week: 0.0 standard drinks  ?  Comment: occassionally wine  ? Drug use: No  ? ?Family History  ?Problem Relation Age of Onset  ? Breast cancer Maternal Grandmother   ? Breast cancer Maternal Aunt   ? Cancer Maternal Uncle   ?     Non Hodgkins/Colon  ? ?Allergies  ?Allergen Reactions  ? Shrimp [Shellfish Allergy] Swelling  ? Sulfa Antibiotics   ? Pseudoephedrine Itching and Rash  ?  REACTION: face itched, rash  ? Tramadol Nausea Only  ?  DIZZINESS AND FAINT FEELING  ? ?Current Outpatient Medications on File Prior to Visit  ?Medication Sig Dispense Refill  ? acyclovir (ZOVIRAX) 200 MG capsule Take 2 capsules (400 mg total) by mouth 4 (four) times daily as needed (for lip swelling). 30 capsule 5  ? albuterol (VENTOLIN HFA) 108 (90 Base) MCG/ACT inhaler Inhale 2 puffs into the lungs every 6 (six) hours as needed for wheezing or shortness of breath. 8 g 0  ? cholecalciferol (VITAMIN D) 25 MCG (1000 UT) tablet Take 2,000 Units by mouth daily.    ? mometasone (ELOCON) 0.1 % cream Apply topically daily. Apply to affected area for eczema once daily as needed -  small amount. 45 g 3  ? ondansetron (ZOFRAN) 8 MG tablet Take 1 tablet (8 mg total) by mouth every 8 (eight) hours as needed for nausea or vomiting. 10 tablet 1  ? SUMAtriptan (IMITREX) 100 MG tablet Take 1 tablet (100 mg total) by mouth every 2 (two) hours as needed for migraine. May repeat in 2 hours if headache persists or recurs. 10 tablet 11  ? ?No current facility-administered medications on file prior to visit.  ?  ?Review of Systems  ?Constitutional:  Negative for activity change, appetite change, fatigue, fever and unexpected weight change.  ?HENT:  Negative for congestion, ear pain, rhinorrhea, sinus pressure and sore throat.   ?Eyes:  Negative for pain, redness and visual disturbance.  ?Respiratory:  Negative for cough, shortness of  breath and wheezing.   ?Cardiovascular:  Negative for chest pain and palpitations.  ?Gastrointestinal:  Negative for abdominal pain, blood in stool, constipation and diarrhea.  ?Endocrine: Negative for polydipsia and polyuria.  ?Genitourinary:  Negative for dysuria, frequency and urgency.  ?Musculoskeletal:  Negative for arthralgias, back pain and myalgias.  ?Skin:  Positive for wound. Negative for pallor and rash.  ?Allergic/Immunologic: Negative for environmental allergies.  ?Neurological:  Negative for dizziness, syncope and headaches.  ?Hematological:  Negative for adenopathy. Does not bruise/bleed easily.  ?Psychiatric/Behavioral:  Negative for decreased concentration and dysphoric mood. The patient is not nervous/anxious.   ? ?   ?Objective:  ? Physical Exam ?Constitutional:   ?   General: She is not in acute distress. ?   Appearance: Normal appearance. She is well-developed. She is obese. She is not ill-appearing or diaphoretic.  ?HENT:  ?   Head: Normocephalic and atraumatic.  ?Eyes:  ?   Conjunctiva/sclera: Conjunctivae normal.  ?   Pupils: Pupils are equal, round, and reactive to light.  ?Neck:  ?   Thyroid: No thyromegaly.  ?   Vascular: No carotid bruit or JVD.  ?Cardiovascular:  ?   Rate and Rhythm: Normal rate and regular rhythm.  ?   Heart sounds: Normal heart sounds.  ?  No gallop.  ?Pulmonary:  ?   Effort: Pulmonary effort is normal. No respiratory distress.  ?   Breath sounds: Normal breath sounds. No wheezing or rales.  ?Abdominal:  ?   General: There is no distension or abdominal bruit.  ?   Palpations: Abdomen is soft. There is no hepatomegaly, splenomegaly or mass.  ?   Tenderness: There is no abdominal tenderness. There is no guarding or rebound.  ?   Hernia: No hernia is present.  ?Musculoskeletal:  ?   Cervical back: Normal range of motion and neck supple.  ?   Right lower leg: No edema.  ?   Left lower leg: No edema.  ?Lymphadenopathy:  ?   Cervical: No cervical adenopathy.  ?Skin: ?    General: Skin is warm and dry.  ?   Coloration: Skin is not pale.  ?   Findings: No rash.  ?   Comments: 1 cm area of thickened and hyperpigmented skin medial/under L breast (over sternum) ?No erythema or warmth but mildly tender ?No discharge ?Resembles scar ?Similar area noted in the midline beneath right breast from an old cyst  ?Neurological:  ?   Mental Status: She is alert.  ?   Coordination: Coordination normal.  ?   Deep Tendon Reflexes: Reflexes are normal and symmetric. Reflexes normal.  ?Psychiatric:     ?   Mood and Affect: Mood normal.  ? ? ? ? ? ?   ?  Assessment & Plan:  ? ?Problem List Items Addressed This Visit   ? ?  ? Musculoskeletal and Integument  ? Cyst of skin - Primary  ?  Under the inner corner of her left breast with some scarring, appears to be superficial like a cyst that was recently drained and slightly tender ?No erythema or signs of infection ?Discussed care with warm compresses, keep clean with soap and water ?Plan referral to dermatology to evaluate further and possibly remove ?Update if not starting to improve in a week or if worsening    See avs ?  ?  ? Relevant Orders  ? Ambulatory referral to Dermatology  ?  ? Other  ? Abdominal pain  ?  Much improved with omeprazole 40 mg and bentyl prn  ?Planning GI ref in light of elevated LFTs ?  ?  ? Relevant Medications  ? omeprazole (PRILOSEC) 40 MG capsule  ? Other Relevant Orders  ? Hepatic function panel  ? Elevated LFTs  ?  Re check today  ?abd symptoms are improved  ?  ?  ? ? ? ?

## 2021-06-25 ENCOUNTER — Encounter: Payer: Self-pay | Admitting: *Deleted

## 2021-07-01 ENCOUNTER — Telehealth: Payer: Self-pay | Admitting: Family Medicine

## 2021-07-01 NOTE — Telephone Encounter (Signed)
Pt called asking for a call back to discuss her lab results. Please advise.  ?

## 2021-07-02 NOTE — Telephone Encounter (Signed)
Left VM letting pt know letter was mailed with labs and Dr. Marliss Coots comments and all of her labs were normal/stable and if she had any other questions to call us back ?

## 2021-07-24 ENCOUNTER — Encounter: Payer: Self-pay | Admitting: Family Medicine

## 2021-07-29 ENCOUNTER — Encounter: Payer: Self-pay | Admitting: Nurse Practitioner

## 2021-08-13 ENCOUNTER — Ambulatory Visit: Payer: 59 | Admitting: Nurse Practitioner

## 2021-08-14 ENCOUNTER — Ambulatory Visit: Payer: 59 | Admitting: Nurse Practitioner

## 2021-08-14 NOTE — Progress Notes (Deleted)
08/14/2021 Kim Walsh [id number] 07/30/1985   CHIEF COMPLAINT: Epigastric pain, elevated LFTs  HISTORY OF PRESENT ILLNESS: Kim Walsh is a 36 year old female with a past medical history of eczema, gastritis, gallbladder polyp s/p laparoscopic cholecystectomy 11/2017. S/P C section 04/2020.  She presents to our office today as referred by Dr. Loura Pardon for further evaluation regarding periumbilical pain and elevated LFTs. She went to the ED 05/06/2021 due to having upper abdominal pain described as a twisting or turning pain with vomiting x 1 episode.  Labs 2/01 - 05/07/2021 showed an Alk phos level 129 -> 135. AST 297 -> 127. ALT 320 -> 407. Normal T bilirubin level 0.6 -> 0.4. Acute hepatitis panel negative. Repeat labs 06/24/2021 Alk phos 61. AST 14. ALT 14. T. Bili 0.7. CTAP with contrast 05/07/2021 showed a normal gallbladder without evidence of biliary dilatation.      Latest Ref Rng & Units 06/24/2021   10:59 AM 05/07/2021   11:24 AM 05/06/2021    3:45 AM  CMP  Glucose 70 - 99 mg/dL  104   219    BUN 6 - 23 mg/dL  16   12    Creatinine 0.40 - 1.20 mg/dL  0.98   0.81    Sodium 135 - 145 mEq/L  140   136    Potassium 3.5 - 5.1 mEq/L  4.0   4.5    Chloride 96 - 112 mEq/L  104   102    CO2 19 - 32 mEq/L  31   22    Calcium 8.4 - 10.5 mg/dL  10.0   9.6    Total Protein 6.0 - 8.3 g/dL 6.8   8.0   8.4    Total Bilirubin 0.2 - 1.2 mg/dL 0.7   0.4   0.6    Alkaline Phos 39 - 117 U/L 61   135   129    AST 0 - 37 U/L 14   127   297    ALT 0 - 35 U/L 14   407   320    Lipase 30 on 05/06/2021.  Acute hepatitis panel: Hepatitis A IgM negative.  Hepatitis B surface antigen negative.  Hepatitis B core IgM negative.  Hepatitis C antibody negative.     Latest Ref Rng & Units 05/06/2021    3:45 AM 11/28/2018   11:47 AM 05/13/2016    2:08 PM  CBC  WBC 4.0 - 10.5 K/uL 7.1   5.7   11.1    Hemoglobin 12.0 - 15.0 g/dL 12.5   12.3   11.8    Hematocrit 36.0 - 46.0 % 37.2   35.1    35.2    Platelets 150 - 400 K/uL 311   289   261.0       CTAP 05/07/2021: Lower chest: Nodular patchy airspace disease is identified in the right lower lobe (see image 10 of series 4) compatible with pneumonia. Left lung base clear.   Hepatobiliary: No suspicious focal abnormality within the liver parenchyma. Nonvisualization of the gallbladder is compatible with decompressed state or surgical absence. No intrahepatic or extrahepatic biliary dilation.   Pancreas: No focal mass lesion. No dilatation of the main duct. No intraparenchymal cyst. No peripancreatic edema.   Spleen: No splenomegaly. No focal mass lesion.   Adrenals/Urinary Tract: No adrenal nodule or mass. 14 mm low-density lesion in the upper pole right kidney (29/2) has attenuation higher than would be expected for a  simple cyst. There may be some internal septation/architecture within the lesion (see image 30/2). Tiny hypodensity in the lower pole left kidney is too small to characterize but likely benign. No evidence for hydroureter. The urinary bladder appears normal for the degree of distention.   Stomach/Bowel: Stomach is unremarkable. No gastric wall thickening. No evidence of outlet obstruction. Duodenum is normally positioned as is the ligament of Treitz. No small bowel wall thickening. No small bowel dilatation. The terminal ileum is normal. The appendix is normal. No gross colonic mass. No colonic wall thickening.   Vascular/Lymphatic: No abdominal aortic aneurysm. No abdominal lymphadenopathy. No pelvic sidewall lymphadenopathy.   Reproductive: Small calcified uterine fibroids evident. There is no adnexal mass.   Other: Trace free fluid noted in the cul-de-sac. This can be physiologic in premenopausal female.   Musculoskeletal: No worrisome lytic or sclerotic osseous abnormality.   IMPRESSION: 1. No acute findings in the abdomen or pelvis. 2. Nodular patchy airspace disease in the right lower  lobe compatible with pneumonia. 3. 14 mm low-density lesion in the upper pole right kidney has attenuation higher than would be expected for a simple cyst. There may be some internal septation/architecture within the lesion. Given patient age, this is most likely a cyst with internal septation, but follow-up nonemergent outpatient MRI with and without contrast recommended to confirm. 4. Small calcified uterine fibroids.      RUQ 05/14/2016: Gallbladder: There is a 5 mm polyp in the gallbladder. No stones, sludge, wall thickening, or pericholecystic fluid.   Common bile duct: Diameter: 3.7 mm   Liver: No focal lesion identified. Within normal limits in parenchymal echogenicity.   IMPRESSION: 1. 5 mm polyp in the gallbladder. 2. Incidental 1.5 cm right renal cyst.     Past Medical History:  Diagnosis Date   Congenitally short metatarsal    Bilateral    Eczema    Gastritis    Headache(784.0)    Tonsillitis    Past Surgical History:  Procedure Laterality Date   TONSILLECTOMY     Social History:  Family History:    reports that she has never smoked. She has never used smokeless tobacco. She reports current alcohol use. She reports that she does not use drugs. family history includes Breast cancer in her maternal aunt and maternal grandmother; Cancer in her maternal uncle.  Allergies  Allergen Reactions   Shrimp [Shellfish Allergy] Swelling   Sulfa Antibiotics    Pseudoephedrine Itching and Rash    REACTION: face itched, rash   Tramadol Nausea Only    DIZZINESS AND FAINT FEELING      Outpatient Encounter Medications as of 08/14/2021  Medication Sig   acyclovir (ZOVIRAX) 200 MG capsule Take 2 capsules (400 mg total) by mouth 4 (four) times daily as needed (for lip swelling).   albuterol (VENTOLIN HFA) 108 (90 Base) MCG/ACT inhaler Inhale 2 puffs into the lungs every 6 (six) hours as needed for wheezing or shortness of breath.   cholecalciferol (VITAMIN D) 25 MCG  (1000 UT) tablet Take 2,000 Units by mouth daily.   dicyclomine (BENTYL) 20 MG tablet Take 1 tablet (20 mg total) by mouth every 6 (six) hours as needed (Abdominal cramping).   mometasone (ELOCON) 0.1 % cream Apply topically daily. Apply to affected area for eczema once daily as needed - small amount.   omeprazole (PRILOSEC) 40 MG capsule Take 1 capsule (40 mg total) by mouth daily.   ondansetron (ZOFRAN) 8 MG tablet Take 1 tablet (8 mg total) by  mouth every 8 (eight) hours as needed for nausea or vomiting.   SUMAtriptan (IMITREX) 100 MG tablet Take 1 tablet (100 mg total) by mouth every 2 (two) hours as needed for migraine. May repeat in 2 hours if headache persists or recurs.   No facility-administered encounter medications on file as of 08/14/2021.     REVIEW OF SYSTEMS: All other systems reviewed and negative except where noted in the History of Present Illness. Gen: Denies fever, sweats or chills. No weight loss.  CV: Denies chest pain, palpitations or edema. Resp: Denies cough, shortness of breath of hemoptysis.  GI: Denies heartburn, dysphagia, stomach or lower abdominal pain. No diarrhea or constipation.  GU : Denies urinary burning, blood in urine, increased urinary frequency or incontinence. MS: Denies joint pain, muscles aches or weakness. Derm: Denies rash, itchiness, skin lesions or unhealing ulcers. Psych: Denies depression, anxiety, memory loss, suicidal ideation and confusion. Heme: Denies bruising, easy bleeding. Neuro:  Denies headaches, dizziness or paresthesias. Endo:  Denies any problems with DM, thyroid or adrenal function.  PHYSICAL EXAM: There were no vitals taken for this visit. General: Well developed ... in no acute distress. Head: Normocephalic and atraumatic. Eyes:  Sclerae non-icteric, conjunctive pink. Ears: Normal auditory acuity. Mouth: Dentition intact. No ulcers or lesions.  Neck: Supple, no lymphadenopathy or thyromegaly.  Lungs: Clear bilaterally to  auscultation without wheezes, crackles or rhonchi. Heart: Regular rate and rhythm. No murmur, rub or gallop appreciated.  Abdomen: Soft, nontender, non distended. No masses. No hepatosplenomegaly. Normoactive bowel sounds x 4 quadrants.  Rectal:  Musculoskeletal: Symmetrical with no gross deformities. Skin: Warm and dry. No rash or lesions on visible extremities. Extremities: No edema. Neurological: Alert oriented x 4, no focal deficits.  Psychological:  Alert and cooperative. Normal mood and affect.  ASSESSMENT AND PLAN:    CC:  Tower, [name], MD

## 2021-08-21 ENCOUNTER — Telehealth (INDEPENDENT_AMBULATORY_CARE_PROVIDER_SITE_OTHER): Payer: 59 | Admitting: Nurse Practitioner

## 2021-08-21 DIAGNOSIS — J029 Acute pharyngitis, unspecified: Secondary | ICD-10-CM | POA: Diagnosis not present

## 2021-08-21 MED ORDER — AMOXICILLIN 500 MG PO CAPS
500.0000 mg | ORAL_CAPSULE | Freq: Two times a day (BID) | ORAL | 0 refills | Status: AC
Start: 2021-08-21 — End: 2021-08-31

## 2021-08-21 NOTE — Progress Notes (Signed)
Patient ID: Kim Walsh, female    DOB: [birth year], 36 y.o.   MRN: [id number]  Virtual visit completed through [employer], a video enabled telemedicine application. Due to national recommendations of social distancing due to COVID-19, a virtual visit is felt to be most appropriate for this patient at this time. Reviewed limitations, risks, security and privacy concerns of performing a virtual visit and the availability of in person appointments. I also reviewed that there may be a patient responsible charge related to this service. The patient agreed to proceed.   Patient location: home Provider location: [place] [facility], office Persons participating in this virtual visit: patient, provider   If any vitals were documented, they were collected by patient at home unless specified below.    There were no vitals taken for this visit.   CC: Sore throat Subjective:   HPI: Kim Walsh is a 36 y.o. female presenting on 08/21/2021 for Sore Throat (Sore throat and headache, headache may be from not sleeping. Symptoms started about Wednesday night, Thursday morning. Has not taken anything. Also has soreness in neck. )    Daughter's symptoms started on Wednesday and she started to run a fever yesterday.  States they took her to the pediatrician today and patient was tested for COVID, flu, strep.  All testing was negative except for strep which came back positive and patient is being treated. States that this morning the patient woke up and had a sore throat.  No covid test, no flu test Pfizer vaccine x 2, and a booster Flu vaccine up to date Has not tried otc medication    Relevant past medical, surgical, family and social history reviewed and updated as indicated. Interim medical history since our last visit reviewed. Allergies and medications reviewed and updated. Outpatient Medications Prior to Visit  Medication Sig Dispense Refill   acyclovir (ZOVIRAX) 200 MG  capsule Take 2 capsules (400 mg total) by mouth 4 (four) times daily as needed (for lip swelling). 30 capsule 5   albuterol (VENTOLIN HFA) 108 (90 Base) MCG/ACT inhaler Inhale 2 puffs into the lungs every 6 (six) hours as needed for wheezing or shortness of breath. 8 g 0   cholecalciferol (VITAMIN D) 25 MCG (1000 UT) tablet Take 2,000 Units by mouth daily.     dicyclomine (BENTYL) 20 MG tablet Take 1 tablet (20 mg total) by mouth every 6 (six) hours as needed (Abdominal cramping). 20 tablet 2   mometasone (ELOCON) 0.1 % cream Apply topically daily. Apply to affected area for eczema once daily as needed - small amount. 45 g 3   omeprazole (PRILOSEC) 40 MG capsule Take 1 capsule (40 mg total) by mouth daily. 90 capsule 1   ondansetron (ZOFRAN) 8 MG tablet Take 1 tablet (8 mg total) by mouth every 8 (eight) hours as needed for nausea or vomiting. 10 tablet 1   SUMAtriptan (IMITREX) 100 MG tablet Take 1 tablet (100 mg total) by mouth every 2 (two) hours as needed for migraine. May repeat in 2 hours if headache persists or recurs. 10 tablet 11   No facility-administered medications prior to visit.     Per HPI unless specifically indicated in ROS section below Review of Systems  Constitutional:  Negative for chills and fever.  HENT:  Positive for postnasal drip and sore throat. Negative for congestion, ear discharge, ear pain, sinus pressure and sinus pain.   Respiratory:  Negative for cough.   Cardiovascular:  Negative for chest pain.  Gastrointestinal:  Negative for diarrhea and nausea.  Musculoskeletal:  Negative for arthralgias and joint swelling.  Neurological:  Positive for headaches.  Objective:  There were no vitals taken for this visit.  Wt Readings from Last 3 Encounters:  06/24/21 178 lb 2 oz (80.8 kg)  05/07/21 178 lb (80.7 kg)  05/06/21 178 lb (80.7 kg)       Physical exam: Gen: alert, NAD, not ill appearing Pulm: speaks in complete sentences without increased work of  breathing Psych: normal mood, normal thought content      Results for orders placed or performed in visit on 06/24/21  Hepatic function panel  Result Value Ref Range   Total Bilirubin 0.7 0.2 - 1.2 mg/dL   Bilirubin, Direct 0.1 0.0 - 0.3 mg/dL   Alkaline Phosphatase 61 39 - 117 U/L   AST 14 0 - 37 U/L   ALT 14 0 - 35 U/L   Total Protein 6.8 6.0 - 8.3 g/dL   Albumin 4.4 3.5 - 5.2 g/dL   Assessment & Plan:   Problem List Items Addressed This Visit       Other   Sore throat - Primary    Given that daughter was recently diagnosed with strep and she has been around her daughter then developed sore throat will like to treat with amoxicillin 500 mg twice daily for 10 days.  Discussed with patient even though the strep test was negative we can send for culture to treat empirically.  Patient was on board with treating empirically she will follow-up if symptoms fail to improve or worsen.       Relevant Medications   amoxicillin (AMOXIL) 500 MG capsule   Other Relevant Orders   POC Rapid Strep A     Meds ordered this encounter  Medications   amoxicillin (AMOXIL) 500 MG capsule    Sig: Take 1 capsule (500 mg total) by mouth 2 (two) times daily for 10 days.    Dispense:  20 capsule    Refill:  0    Order Specific Question:   Supervising Provider    Answer:   Loura Pardon A [[id number]]   Orders Placed This Encounter  Procedures   POC Rapid Strep A    I discussed the assessment and treatment plan with the patient. The patient was provided an opportunity to ask questions and all were answered. The patient agreed with the plan and demonstrated an understanding of the instructions. The patient was advised to call back or seek an in-person evaluation if the symptoms worsen or if the condition fails to improve as anticipated.  Follow up plan: No follow-ups on file.  Romilda Garret, NP

## 2021-08-21 NOTE — Assessment & Plan Note (Signed)
Given that daughter was recently diagnosed with strep and she has been around her daughter then developed sore throat will like to treat with amoxicillin 500 mg twice daily for 10 days.  Discussed with patient even though the strep test was negative we can send for culture to treat empirically.  Patient was on board with treating empirically she will follow-up if symptoms fail to improve or worsen.

## 2021-08-24 ENCOUNTER — Telehealth: Payer: Self-pay | Admitting: Family Medicine

## 2021-08-24 NOTE — Telephone Encounter (Signed)
Will await the symptoms note. She tested positive for strep in office that is why she was covered with amoxicillin

## 2021-08-24 NOTE — Telephone Encounter (Signed)
Pt called and wanted Dr. [name] to know that "she started taking amoxicillin (AMOXIL) 500 MG capsule Friday night" and pt stated "she is feeling worse and wants to know what else she can do." Please return a call when possible.  Callback Number: [phone]

## 2021-08-24 NOTE — Telephone Encounter (Signed)
Pt notified of Dr. Marliss Coots comments and f/u appt scheduled tomorrow at 10 am

## 2021-08-24 NOTE — Telephone Encounter (Signed)
Pt said she is more congested, she has a cough that's not really productive it's more of a dry "barky" cough, and she has some chest pain with the cough. She also still has the ST but now she has developed bad neck pain also. Pt said she did take at home covid test and it was negative. She has been on the abx since Friday but she is feeling worse everyday instead of better. No fever   Will route to PCP and Catalina Antigua, NP who eval strep throat

## 2021-08-24 NOTE — Telephone Encounter (Signed)
Please ask what symptoms she is having  Will cc Matt who saw her

## 2021-08-24 NOTE — Telephone Encounter (Signed)
This sounds more like viral uri than strep.  Abx do not help viruses May be bronchitis F/u in office with first available   If neck pain turns into stiffness with bad HA go to ER (symptom of meningitis)

## 2021-08-25 ENCOUNTER — Encounter: Payer: Self-pay | Admitting: Family Medicine

## 2021-08-25 ENCOUNTER — Ambulatory Visit (INDEPENDENT_AMBULATORY_CARE_PROVIDER_SITE_OTHER): Payer: 59 | Admitting: Family Medicine

## 2021-08-25 DIAGNOSIS — J069 Acute upper respiratory infection, unspecified: Secondary | ICD-10-CM | POA: Diagnosis not present

## 2021-08-25 MED ORDER — ALBUTEROL SULFATE HFA 108 (90 BASE) MCG/ACT IN AERS: 2.0000 | INHALATION_SPRAY | Freq: Four times a day (QID) | RESPIRATORY_TRACT | 3 refills | Status: DC | PRN

## 2021-08-25 NOTE — Progress Notes (Signed)
Subjective:    Patient ID: Kim Walsh, female    DOB: [birth year], 36 y.o.   MRN: [id number]  HPI Pt presents for f/u of uri symptoms  Wt Readings from Last 3 Encounters:  08/25/21 178 lb 6 oz (80.9 kg)  06/24/21 178 lb 2 oz (80.8 kg)  05/07/21 178 lb (80.7 kg)   31.60 kg/m Symptoms started that day with ST  She did a video visit with Matt on 5/19 for ST and headache and fever Daughter had strep so likely she had that  Tx with amox but then symptoms did not improve and she developed more respiratory symptoms   Called yesterday -worse , but a little better today Congestion  : mucous is green/yellow  No sinus pain /just some pressure/ear pressure  Cough -barky sounding and chest felt tight  Used her child's inhaler   Aching in neck , improved today  ST- improved today   Home covid test negative No fever now for 2-3 days   Otc meds  Flonase nasal spray   Patient Active Problem List   Diagnosis Date Noted   Viral URI with cough 08/25/2021   Sore throat 08/21/2021   Cyst of skin 06/24/2021   Kidney lesion, native, right 05/07/2021   Abdominal pain 05/07/2021   Productive cough 05/07/2021   Elevated LFTs 05/07/2021   Grief reaction 05/07/2021   Acute cough 05/05/2021   Fibroid 11/28/2018   Heavy menses 11/24/2018   Lip swelling 11/24/2018   Lower thoracic back pain 01/11/2017   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   Plantar fasciitis, bilateral 03/13/2015   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   Routine general medical examination at a health care facility 03/06/2012   Atopic dermatitis 09/07/2010   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   Change in hearing 07/16/2010   HSV 08/02/2006   SICKLE-CELL TRAIT 08/02/2006   Eczema 08/02/2006   Migraine 08/02/2006   ANA POSITIVE 08/02/2006   Past Medical History:  Diagnosis Date   Congenitally short metatarsal    Bilateral    Eczema    Gastritis    Headache(784.0)    Tonsillitis    Past Surgical History:  Procedure Laterality  Date   TONSILLECTOMY     Social History   Tobacco Use   Smoking status: Never   Smokeless tobacco: Never  Substance Use Topics   Alcohol use: Yes    Alcohol/week: 0.0 standard drinks    Comment: occassionally wine   Drug use: No   Family History  Problem Relation Age of Onset   Breast cancer Maternal Grandmother    Breast cancer Maternal Aunt    Cancer Maternal Uncle        Non Hodgkins/Colon   Allergies  Allergen Reactions   Shrimp [Shellfish Allergy] Swelling   Sulfa Antibiotics    Pseudoephedrine Itching and Rash    REACTION: face itched, rash   Tramadol Nausea Only    DIZZINESS AND FAINT FEELING   Current Outpatient Medications on File Prior to Visit  Medication Sig Dispense Refill   acyclovir (ZOVIRAX) 200 MG capsule Take 2 capsules (400 mg total) by mouth 4 (four) times daily as needed (for lip swelling). 30 capsule 5   amoxicillin (AMOXIL) 500 MG capsule Take 1 capsule (500 mg total) by mouth 2 (two) times daily for 10 days. 20 capsule 0   cholecalciferol (VITAMIN D) 25 MCG (1000 UT) tablet Take 2,000 Units by mouth daily.     dicyclomine (BENTYL) 20 MG tablet Take 1 tablet (20 mg total)  by mouth every 6 (six) hours as needed (Abdominal cramping). 20 tablet 2   mometasone (ELOCON) 0.1 % cream Apply topically daily. Apply to affected area for eczema once daily as needed - small amount. 45 g 3   omeprazole (PRILOSEC) 40 MG capsule Take 1 capsule (40 mg total) by mouth daily. 90 capsule 1   ondansetron (ZOFRAN) 8 MG tablet Take 1 tablet (8 mg total) by mouth every 8 (eight) hours as needed for nausea or vomiting. 10 tablet 1   SUMAtriptan (IMITREX) 100 MG tablet Take 1 tablet (100 mg total) by mouth every 2 (two) hours as needed for migraine. May repeat in 2 hours if headache persists or recurs. 10 tablet 11   No current facility-administered medications on file prior to visit.     Review of Systems  Constitutional:  Positive for fatigue. Negative for activity change,  appetite change, fever and unexpected weight change.  HENT:  Positive for postnasal drip, sneezing, sore throat and voice change. Negative for congestion, ear pain, rhinorrhea and sinus pressure.   Eyes:  Negative for pain, discharge, redness and visual disturbance.  Respiratory:  Positive for cough and chest tightness. Negative for shortness of breath, wheezing and stridor.   Cardiovascular:  Negative for chest pain and palpitations.  Gastrointestinal:  Negative for abdominal pain, blood in stool, constipation, diarrhea, nausea and vomiting.  Endocrine: Negative for polydipsia and polyuria.  Genitourinary:  Negative for dysuria, frequency, hematuria and urgency.  Musculoskeletal:  Negative for arthralgias, back pain and myalgias.  Skin:  Negative for pallor and rash.  Allergic/Immunologic: Negative for environmental allergies.  Neurological:  Positive for headaches. Negative for dizziness, syncope, weakness and light-headedness.  Hematological:  Negative for adenopathy. Does not bruise/bleed easily.  Psychiatric/Behavioral:  Negative for confusion, decreased concentration and dysphoric mood. The patient is not nervous/anxious.       Objective:   Physical Exam Constitutional:      General: She is not in acute distress.    Appearance: Normal appearance. She is well-developed. She is obese. She is not ill-appearing, toxic-appearing or diaphoretic.  HENT:     Head: Normocephalic and atraumatic.     Comments: Nares are injected and congested    No sinus tenderness    Right Ear: Tympanic membrane, ear canal and external ear normal.     Left Ear: Tympanic membrane, ear canal and external ear normal.     Nose: Congestion and rhinorrhea present.     Mouth/Throat:     Mouth: Mucous membranes are moist.     Pharynx: Oropharynx is clear. No oropharyngeal exudate or posterior oropharyngeal erythema.     Comments: Clear pnd  Eyes:     General:        Right eye: No discharge.        Left eye:  No discharge.     Conjunctiva/sclera: Conjunctivae normal.     Pupils: Pupils are equal, round, and reactive to light.  Cardiovascular:     Rate and Rhythm: Normal rate.     Heart sounds: Normal heart sounds.  Pulmonary:     Effort: Pulmonary effort is normal. No respiratory distress.     Breath sounds: Normal breath sounds. No stridor. No wheezing, rhonchi or rales.     Comments: Good air exch No wheeze even with forced exp Chest:     Chest wall: No tenderness.  Musculoskeletal:     Cervical back: Normal range of motion and neck supple.  Lymphadenopathy:     Cervical:  No cervical adenopathy.  Skin:    General: Skin is warm and dry.     Capillary Refill: Capillary refill takes less than 2 seconds.     Findings: No rash.  Neurological:     Mental Status: She is alert.     Cranial Nerves: No cranial nerve deficit.  Psychiatric:        Mood and Affect: Mood normal.          Assessment & Plan:   Problem List Items Addressed This Visit       Respiratory   Viral URI with cough    Cough/congestion and sore throat are starting to improve Reassuring exam Declines prednisone but will call if chest tightness worsens  Refilled albuterol Disc sympt care- mucinex dm Fluids/rest ER precautions rev   Update if not starting to improve in a week or if worsening   Work note to return thurs if better

## 2021-08-25 NOTE — Patient Instructions (Signed)
Drink fluids and rest when you can   Use albuterol inhaler as needed  If your chest tightness worsens please let us know   Mucinex DM will help cough and congestion  Saline Nasal saline spray is helpful  Continue the flonase also   Watch for fever  Watch for shortness of breath

## 2021-08-25 NOTE — Assessment & Plan Note (Signed)
Cough/congestion and sore throat are starting to improve Reassuring exam Declines prednisone but will call if chest tightness worsens  Refilled albuterol Disc sympt care- mucinex dm Fluids/rest ER precautions rev   Update if not starting to improve in a week or if worsening   Work note to return thurs if better

## 2021-09-19 ENCOUNTER — Emergency Department (HOSPITAL_BASED_OUTPATIENT_CLINIC_OR_DEPARTMENT_OTHER): Payer: 59

## 2021-09-19 ENCOUNTER — Other Ambulatory Visit: Payer: Self-pay

## 2021-09-19 ENCOUNTER — Emergency Department (HOSPITAL_BASED_OUTPATIENT_CLINIC_OR_DEPARTMENT_OTHER)
Admission: EM | Admit: 2021-09-19 | Discharge: 2021-09-19 | Disposition: A | Discharge status: Home / Self Care | Payer: 59 | Attending: Emergency Medicine | Admitting: Emergency Medicine

## 2021-09-19 ENCOUNTER — Encounter (HOSPITAL_BASED_OUTPATIENT_CLINIC_OR_DEPARTMENT_OTHER): Payer: Self-pay | Admitting: Emergency Medicine

## 2021-09-19 DIAGNOSIS — R112 Nausea with vomiting, unspecified: Secondary | ICD-10-CM | POA: Insufficient documentation

## 2021-09-19 DIAGNOSIS — M546 Pain in thoracic spine: Secondary | ICD-10-CM | POA: Diagnosis present

## 2021-09-19 LAB — URINALYSIS, ROUTINE W REFLEX MICROSCOPIC
Bilirubin Urine: NEGATIVE
Glucose, UA: NEGATIVE mg/dL
Hgb urine dipstick: NEGATIVE
Ketones, ur: NEGATIVE mg/dL
Leukocytes,Ua: NEGATIVE
Nitrite: NEGATIVE
Specific Gravity, Urine: 1.018 (ref 1.005–1.030)
pH: 7 (ref 5.0–8.0)

## 2021-09-19 LAB — COMPREHENSIVE METABOLIC PANEL
ALT: 31 U/L (ref 0–44)
AST: 24 U/L (ref 15–41)
Albumin: 5 g/dL (ref 3.5–5.0)
Alkaline Phosphatase: 60 U/L (ref 38–126)
Anion gap: 11 (ref 5–15)
BUN: 10 mg/dL (ref 6–20)
CO2: 25 mmol/L (ref 22–32)
Calcium: 10.5 mg/dL — ABNORMAL HIGH (ref 8.9–10.3)
Chloride: 104 mmol/L (ref 98–111)
Creatinine, Ser: 0.96 mg/dL (ref 0.44–1.00)
GFR, Estimated: >60 mL/min (ref >60)
Glucose, Bld: 138 mg/dL — ABNORMAL HIGH (ref 70–99)
Potassium: 3.7 mmol/L (ref 3.5–5.1)
Sodium: 140 mmol/L (ref 135–145)
Total Bilirubin: 1 mg/dL (ref 0.3–1.2)
Total Protein: 8.6 g/dL — ABNORMAL HIGH (ref 6.5–8.1)

## 2021-09-19 LAB — CBC
HCT: 39.3 % (ref 36.0–46.0)
Hemoglobin: 13.3 g/dL (ref 12.0–15.0)
MCH: 28.6 pg (ref 26.0–34.0)
MCHC: 33.8 g/dL (ref 30.0–36.0)
MCV: 84.5 fL (ref 80.0–100.0)
Platelets: 329 10*3/uL (ref 150–400)
RBC: 4.65 MIL/uL (ref 3.87–5.11)
RDW: 13.8 % (ref 11.5–15.5)
WBC: 7.7 10*3/uL (ref 4.0–10.5)
nRBC: 0 % (ref 0.0–0.2)

## 2021-09-19 LAB — PREGNANCY, URINE: Preg Test, Ur: NEGATIVE

## 2021-09-19 LAB — LIPASE, BLOOD: Lipase: 18 U/L (ref 11–51)

## 2021-09-19 MED ORDER — ONDANSETRON HCL 4 MG PO TABS: 4.0000 mg | ORAL_TABLET | Freq: Three times a day (TID) | ORAL | 0 refills | Status: DC | PRN

## 2021-09-19 MED ORDER — KETOROLAC TROMETHAMINE 15 MG/ML IJ SOLN: 15.0000 mg | Freq: Once | INTRAMUSCULAR | Status: DC

## 2021-09-19 MED ORDER — FENTANYL CITRATE PF 50 MCG/ML IJ SOSY
25.0000 ug | PREFILLED_SYRINGE | Freq: Once | INTRAMUSCULAR | Status: AC
  Administered 2021-09-19: 25 ug via INTRAVENOUS
  Filled 2021-09-19: qty 1

## 2021-09-19 MED ORDER — OXYCODONE-ACETAMINOPHEN 5-325 MG PO TABS
1.0000 | ORAL_TABLET | Freq: Once | ORAL | Status: AC
  Administered 2021-09-19: 1 via ORAL
  Filled 2021-09-19: qty 1

## 2021-09-19 MED ORDER — SODIUM CHLORIDE 0.9 % IV BOLUS
1000.0000 mL | Freq: Once | INTRAVENOUS | Status: AC
  Administered 2021-09-19: 1000 mL via INTRAVENOUS

## 2021-09-19 MED ORDER — ONDANSETRON HCL 4 MG/2ML IJ SOLN
4.0000 mg | Freq: Once | INTRAMUSCULAR | Status: AC | PRN
  Administered 2021-09-19: 4 mg via INTRAVENOUS
  Filled 2021-09-19: qty 2

## 2021-09-19 MED ORDER — KETOROLAC TROMETHAMINE 30 MG/ML IJ SOLN
INTRAMUSCULAR | Status: AC
  Administered 2021-09-19: 30 mg
  Filled 2021-09-19: qty 1

## 2021-09-19 MED ORDER — ONDANSETRON 4 MG PO TBDP
4.0000 mg | ORAL_TABLET | Freq: Once | ORAL | Status: AC
  Administered 2021-09-19: 4 mg via ORAL
  Filled 2021-09-19: qty 1

## 2021-09-19 NOTE — ED Notes (Signed)
Patient states if pregnancy test is positive, she does not want it mentioned in front of her visitor.  Charge Nurse, Dillon Bjork, RN advised.

## 2021-09-19 NOTE — Discharge Instructions (Addendum)
It was a pleasure taking care of you today!   Your labs and imaging were unremarkable today. You will be sent a prescription for zofran, take as directed. Ensure to maintain fluid intake. You may follow up with your primary care provider regarding todays ED visit. Call your GI doctor and set up a follow up appointment regarding todays ED visit. Return to the ED if you are experiencing increasing/worsening pain, vomiting, fever, chest pain, trouble breathing, or worsening symptoms.

## 2021-09-19 NOTE — ED Provider Notes (Signed)
[facility] DEPT Provider Note   CSN: [id number] Arrival date & time: 09/19/21  1018     History  Chief Complaint  Patient presents with   Emesis   Back Pain    Kim Walsh is a 36 y.o. female who presents to the ED complaining of right mid back pain onset yesterday.  Denies recent injury, trauma, fall.  Also has associated emesis onset yesterday.  Had her gallbladder taken out 3 years ago and has had similar episodes of pain in the past.  Her primary care provider has attempted to get her in with a GI specialist however she is not had an appointment yet.  No meds tried prior to arrival.  Denies dysuria, hematuria, abdominal pain, diarrhea, constipation.    The history is provided by the patient. No language interpreter was used.       Home Medications Prior to Admission medications   Medication Sig Start Date End Date Taking? Authorizing Provider  acyclovir (ZOVIRAX) 200 MG capsule Take 2 capsules (400 mg total) by mouth 4 (four) times daily as needed (for lip swelling). 11/24/18   Tower, [name], MD  albuterol (VENTOLIN HFA) 108 (90 Base) MCG/ACT inhaler Inhale 2 puffs into the lungs every 6 (six) hours as needed for wheezing or shortness of breath. 08/25/21   Tower, [name], MD  cholecalciferol (VITAMIN D) 25 MCG (1000 UT) tablet Take 2,000 Units by mouth daily.    [provider]  dicyclomine (BENTYL) 20 MG tablet Take 1 tablet (20 mg total) by mouth every 6 (six) hours as needed (Abdominal cramping). 06/24/21   Tower, [name], MD  mometasone (ELOCON) 0.1 % cream Apply topically daily. Apply to affected area for eczema once daily as needed - small amount. 11/24/18   Tower, [name], MD  omeprazole (PRILOSEC) 40 MG capsule Take 1 capsule (40 mg total) by mouth daily. 06/24/21   Tower, [name], MD  ondansetron (ZOFRAN) 4 MG tablet Take 1 tablet (4 mg total) by mouth every 8 (eight) hours as needed for nausea or vomiting. 09/19/21   Mariateresa Batra,  Anarely Nicholls A, PA-C  SUMAtriptan (IMITREX) 100 MG tablet Take 1 tablet (100 mg total) by mouth every 2 (two) hours as needed for migraine. May repeat in 2 hours if headache persists or recurs. 11/24/18   Tower, [name], MD      Allergies    Shrimp [shellfish allergy], Sulfa antibiotics, Pseudoephedrine, and Tramadol    Review of Systems   Review of Systems  Respiratory:  Negative for shortness of breath.   Cardiovascular:  Negative for chest pain.  Gastrointestinal:  Positive for vomiting. Negative for abdominal pain, constipation, diarrhea and nausea.  Genitourinary:  Negative for dysuria and hematuria.  Musculoskeletal:  Positive for back pain.  All other systems reviewed and are negative.   Physical Exam Updated Vital Signs BP 124/78 (BP Location: Right Arm)   Pulse (!) 120   Temp 98.8 F (37.1 C) (Oral)   Resp 20   Ht '5\' 4"'$  (1.626 m)   Wt 78 kg   LMP 09/01/2021 (Approximate)   SpO2 99%   BMI 29.52 kg/m  Physical Exam Vitals and nursing note reviewed.  Constitutional:      General: She is not in acute distress.    Appearance: She is not diaphoretic.  HENT:     Head: Normocephalic and atraumatic.     Mouth/Throat:     Pharynx: No oropharyngeal exudate.  Eyes:  General: No scleral icterus.    Conjunctiva/sclera: Conjunctivae normal.  Cardiovascular:     Rate and Rhythm: Normal rate and regular rhythm.     Pulses: Normal pulses.     Heart sounds: Normal heart sounds.  Pulmonary:     Effort: Pulmonary effort is normal. No respiratory distress.     Breath sounds: Normal breath sounds. No wheezing.  Abdominal:     General: Bowel sounds are normal.     Palpations: Abdomen is soft. There is no mass.     Tenderness: There is generalized abdominal tenderness. There is no guarding or rebound.     Comments: Tenderness to palpation noted diffusely throughout abdomen.  Musculoskeletal:        General: Normal range of motion.     Cervical back: Normal range of motion and  neck supple.     Comments: Tenderness to palpation noted to right thoracic musculature without overlying skin changes.  No spinal tenderness to palpation noted.  Skin:    General: Skin is warm and dry.  Neurological:     Mental Status: She is alert.  Psychiatric:        Behavior: Behavior normal.     ED Results / Procedures / Treatments   Labs (all labs ordered are listed, but only abnormal results are displayed) Labs Reviewed  COMPREHENSIVE METABOLIC PANEL - Abnormal; Notable for the following components:      Result Value   Glucose, Bld 138 (*)    Calcium 10.5 (*)    Total Protein 8.6 (*)    All other components within normal limits  URINALYSIS, ROUTINE W REFLEX MICROSCOPIC - Abnormal; Notable for the following components:   Protein, ur TRACE (*)    All other components within normal limits  LIPASE, BLOOD  CBC  PREGNANCY, URINE    EKG None  Radiology US Abdomen Complete  Result Date: 09/19/2021 CLINICAL DATA:  Abdominal pain EXAM: ABDOMEN ULTRASOUND COMPLETE COMPARISON:  CT done on 05/06/2021 FINDINGS: Gallbladder: Gallbladder is not seen consistent with cholecystectomy. Common bile duct: Diameter: 2.7 mm Liver: There is increased echogenicity in the liver. No focal abnormality is seen. Portal vein is patent on color Doppler imaging with normal direction of blood flow towards the liver. IVC: No abnormality visualized. Pancreas: Visualized portion unremarkable. Spleen: Size and appearance within normal limits. Right Kidney: Length: 11.1 cm. There is 1.8 x 1.3 cm anechoic structure in the upper pole of right kidney suggesting renal cyst. Left Kidney: Length: 11.3 cm. Echogenicity within normal limits. No mass or hydronephrosis visualized. Abdominal aorta: No aneurysm visualized. Other findings: None. IMPRESSION: Status post cholecystectomy.  Fatty liver.  Small right renal cyst. Abdominal sonogram is otherwise unremarkable. Electronically Signed   By: Elmer Picker M.D.   On:  09/19/2021 13:54    Procedures Procedures    Medications Ordered in ED Medications  ondansetron (ZOFRAN) injection 4 mg (4 mg Intravenous Given 09/19/21 1159)  sodium chloride 0.9 % bolus 1,000 mL (1,000 mLs Intravenous New Bag/Given 09/19/21 1309)  fentaNYL (SUBLIMAZE) injection 25 mcg (25 mcg Intravenous Given 09/19/21 1306)  oxyCODONE-acetaminophen (PERCOCET/ROXICET) 5-325 MG per tablet 1 tablet (1 tablet Oral Given 09/19/21 1519)  ondansetron (ZOFRAN-ODT) disintegrating tablet 4 mg (4 mg Oral Given 09/19/21 1536)  ketorolac (TORADOL) 30 MG/ML injection (30 mg  Given 09/19/21 1519)    ED Course/ Medical Decision Making/ A&P Clinical Course as of 09/20/21 1214  Sat Sep 19, 2021  1429 Re-evaluated and resting comfortably on stretcher. Discussed with patient regarding  lab and imaging findings. [SB]  4193 Re-evaluated and patient noted that symptoms were the same after the toradol.  Discussed with patient that we can transfer her out to rule out PE due to persistent tachycardia despite pain management and IVF in the ED. Patient denies chest pain, shortness of breath, anticoagulant use, OCP, HRT, recent immobilization, recent surgery, history of malignancy. [SB]  7902 Patient reevaluated and noted that she would not like to be transferred at this time for a CTA of the chest or CT of the kidneys to rule out kidney stone.  Offered patient obtaining a D-dimer here to determine need for CTA chest, patient declined at this time.  Patient is that she will obtain a CT to rule out a kidney stone with her primary care provider in the outpatient setting.  Patient without any risk factors for PE at this time, tachycardia has abated at this time following Toradol in the emergency department.  Patient at 99% O2 on room air, no chest pain or shortness of breath, doubt PE at this time.  Patient appears safe for discharge at this time. [SB]    Clinical Course User Index [SB] Lynk Marti A, PA-C                            Medical Decision Making Amount and/or Complexity of Data Reviewed Labs: ordered. Radiology: ordered.  Risk Prescription drug management.   Pt presents with right mid back pain onset yesterday.  No recent injury, trauma, fall.  Also notes episodes of emesis yesterday.  Has a history of abdominal pain that is chronic however has not followed up with the GI specialist that she was referred to.  Initial vital signs, patient tachycardic at 120, patient afebrile.  Patient denies OCP, HRT, anticoagulant use, recent immobilization/surgery, history of DVT/PE, history of malignancy.  Denies chest pain or shortness of breath at this time. On exam, pt with diffuse abdominal tenderness to palpation.  Tenderness to palpation also noted to right mid back.  No spinal tenderness to palpation.  Tachycardic on exam.  No acute respiratory exam findings. Differential diagnosis includes acute cystitis, nephrolithiasis, PE, appendicitis.   Labs:  I ordered, and personally interpreted labs.  The pertinent results include:   Lipase unremarkable. CBC unremarkable Pregnancy urine negative. CMP with slightly elevated glucose at 138, otherwise unremarkable. Urinalysis unremarkable  Imaging: I ordered imaging studies including ultrasound abdomen I independently visualized and interpreted imaging which showed:  Status post cholecystectomy.  Fatty liver.  Small right renal cyst.  Abdominal sonogram is otherwise unremarkable.   I agree with the radiologist interpretation  Medications:  I ordered medication including Zofran, Toradol, Percocet, IV fluids for symptom management Reevaluation of the patient after these medicines and interventions, I reevaluated the patient and found that they have improved I have reviewed the patients home medicines and have made adjustments as needed   Disposition: Presentation suspicious for musculoskeletal concern for back pain.  Also concern for acute on chronic chronic  abdominal pain, patient had an appointment with a GI specialist however missed her appointment.  Doubt acute cystitis at this time.  Patient without urinary symptoms as well.  Doubt kidney stone as cause of patient's symptoms, ultrasound without findings of hydronephrosis or hydroureter on exam.  Doubt acute cholecystitis, patient with cholecystectomy approximately 3 years ago, doubt concerns for cholelithiasis at this time. Patient does not have any risk factors for PE, not currently on anticoagulants, HRT, OCP,  no history of malignancy, no recent immobilization or surgeries.  Due to tachycardia resolving with treatment measures in the emergency department, less likely PE at this time, tachycardia likely in the setting of pain.  However, discussed with patient in length regarding could obtain a CTA of the chest to rule out pulmonary embolism, as well as discussed with patient could obtain a D-dimer first to determine if a CTA of the chest is necessary.  Discussed with patient if we were to proceed with a CT of the chest for kidneys then patient would need to be transferred to another facility due to CT scan being unavailable at this time at this site.  Patient notes that she would like to proceed with a CT in the outpatient setting with her primary care provider.  Feel that this is reasonable due to resolution of patient's tachycardia and no chest pain or shortness of breath in the emergency department currently.  After consideration of the diagnostic results and the patients response to treatment, I feel that the patient would benefit from Discharge home.  Discussed with patient in length regarding importance of following up with her GI specialist for follow-up appointment for her acute on chronic abdominal pain.  Also discussed with patient to follow-up with her primary care provider for possible CT study to rule out kidney stones as well as PE if deemed necessary by primary care provider at that time.  Patient  sent with a prescription for Zofran.  Patient also requested information for urology specialist, information provided today.  Supportive care measures and strict return precautions discussed with patient at bedside. Pt acknowledges and verbalizes understanding. Pt appears safe for discharge. Follow up as indicated in discharge paperwork.    This chart was dictated using voice recognition software, Dragon. Despite the best efforts of this provider to proofread and correct errors, errors may still occur which can change documentation meaning.   Final Clinical Impression(s) / ED Diagnoses Final diagnoses:  Nausea and vomiting, unspecified vomiting type  Acute right-sided thoracic back pain    Rx / DC Orders ED Discharge Orders          Ordered    ondansetron (ZOFRAN) 4 MG tablet  Every 8 hours PRN,   Status:  Discontinued        09/19/21 1432    ondansetron (ZOFRAN) 4 MG tablet  Every 8 hours PRN        09/19/21 1703              [place] Skalicky A, PA-C 09/20/21 1214    Isla Pence, MD 09/20/21 1501

## 2021-09-19 NOTE — ED Triage Notes (Signed)
Pt via pov from home with back pain and emesis since last night. Pt states she has hx of kidney cyst, elevated liver enzymes; cholecystectomy 3 years ago. These episodes happen approximately every 3 months, pcp told her she might be "throwing stones from the common bile duct." Pt alert & oriented, nad noted.

## 2021-09-21 ENCOUNTER — Encounter: Payer: Self-pay | Admitting: Physician Assistant

## 2021-10-16 ENCOUNTER — Ambulatory Visit: Payer: 59 | Admitting: Physician Assistant

## 2021-10-22 NOTE — Progress Notes (Signed)
10/23/2021 Kim Walsh [id number] 10-05-1985  Referring provider: [name], MD Primary GI doctor: Dr. Hilarie Fredrickson  ASSESSMENT AND PLAN:   Assessment: 1. Elevated LFTs   2. Periumbilical abdominal pain   3. Nausea and vomiting, unspecified vomiting type   4. Alternating constipation and diarrhea   5. Epigastric pain    Abdominal pain associate with nausea and vomiting Status post cholecystectomy Mother with history of stomach cancer Will start the patient on a PPI, will schedule for EGD Lifestyle changes discussed, avoid NSAIDS, ETOH  Alternating constipation and diarrhea No blood in the stool, no weight loss, no family history of colon cancer or IBD. 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 We will check sed rate and CRP, if severely elevated or any anemia can consider colonoscopy. Can do trial of IBGARD, and will give Bentyl as needed Avoid lactulose. FODMAP and lifestyle changes discussed.  Consider xifaxin trial  Elevated LFTs This has down trended, medications, most likely associated with viral illness, possible Epstein-Barr, negative hepatitis panel. Suspected non-alcoholic fatty liver disease by history and ultrasound with some sort of acute issue.  Must exclude other chronic causes of hepatocellular inflammation that can mimic fatty liver on ultrasound. - Labs to include: iron, ferritin, TIBC,  IgG, ANA, Antismooth muscle antibody, celiac, alpha-one-antitrypsin level   Plan: Orders Placed This Encounter  Procedures   CBC with Differential/Platelet   Comprehensive metabolic panel   Tissue transglutaminase, IgA   IgA   IBC + Ferritin   IgG   ANA   Anti-smooth muscle antibody, IgG   Mitochondrial antibodies   Ceruloplasmin   Alpha-1-antitrypsin   Epstein-Barr virus VCA antibody panel   High sensitivity CRP   Sedimentation rate   Ambulatory  referral to Gastroenterology    Meds ordered this encounter  Medications   ondansetron (ZOFRAN) 4 MG tablet    Sig: Take 1 tablet (4 mg total) by mouth every 8 (eight) hours as needed for nausea or vomiting.    Dispense:  39 tablet    Refill:  1   omeprazole (PRILOSEC) 40 MG capsule    Sig: Take 1 capsule (40 mg total) by mouth daily.    Dispense:  90 capsule    Refill:  1    Patient Care Team: Tower, [name], MD as PCP - General  HISTORY OF PRESENT ILLNESS: 36 y.o. female presents for evaluation of abdominal pain for several months associated with nausea and vomiting. .  Patient is status post cholecystectomy  She states she has had issues with her stomach hurting with nausea and vomiting since she was younger. She would have to call out of work. Had normal GB, normal HIDA but due to vomiting had choly States was enlarged GB.  Would have vomiting/nausea with exertion/exercise.  Was doing better but then started to have nausea and vomiting again, less frequent.  She had child in daycare assumed from that but no one else sick.  Her mom recently diagnosed with stage 4 stomach cancer.  Diagnosed with IBS due to stress from this, mom passed.  She was having alternating constipation to diarrhea, if she is upset will need to have BM.  She has been to ER x 2 times. Has sharp stabbing epigastric pain with vomiting.  Uncle with non hodgkin's lymphoma, Maternal GF 45s and great maternal uncle with colon cancer   First presented to the ER 05/06/2021, has  seen primary care and been to the ER again 09/19/2021. Patient complains of mid abdominal/periumbilical abdominal pain intermittently for several months associate with episodes of nausea and vomiting. 05/06/2021 CT abdomen pelvis with contrast showed no acute findings, nodule patchy airspace of right lower lobe compatible with pneumonia, simple cyst right kidney, uterine fibroid Hemoglobin 12.5, no leukocytosis, normal platelets. Trial of  omeprazole twice daily and Bentyl, some help.  05/07/2021 AST 297, ALT 320, alkaline phosphatase 129 -have since downtrended to normal. 05/07/2021 negative hepatitis panel 09/19/2021 AB [place] fatty liver 2019 negative H. Pylori, had breath test.  She body mass index is 29.52 kg/m.Marland Kitchen  The patient denies issues with jaundice, scleral icterus, dark urine, clay colored stool.  She denies Abdominal distention, LE edema. Denies generalized pruritus. She denies confusion.  She states once a month NSAIDS.  She denies ETOH use. Patient is not a smoker or use drugs.  Admits to tylenol minimal tylenol use, less than 3 x a week.  She is not on a statin.  denies  family history of liver disease.    Current Medications:     Current Outpatient Medications (Respiratory):    albuterol (VENTOLIN HFA) 108 (90 Base) MCG/ACT inhaler, Inhale 2 puffs into the lungs every 6 (six) hours as needed for wheezing or shortness of breath.  Current Outpatient Medications (Analgesics):    SUMAtriptan (IMITREX) 100 MG tablet, Take 1 tablet (100 mg total) by mouth every 2 (two) hours as needed for migraine. May repeat in 2 hours if headache persists or recurs.   Current Outpatient Medications (Other):    acyclovir (ZOVIRAX) 200 MG capsule, Take 2 capsules (400 mg total) by mouth 4 (four) times daily as needed (for lip swelling).   cholecalciferol (VITAMIN D) 25 MCG (1000 UT) tablet, Take 2,000 Units by mouth daily.   dicyclomine (BENTYL) 20 MG tablet, Take 1 tablet (20 mg total) by mouth every 6 (six) hours as needed (Abdominal cramping).   mometasone (ELOCON) 0.1 % cream, Apply topically daily. Apply to affected area for eczema once daily as needed - small amount.   omeprazole (PRILOSEC) 40 MG capsule, Take 1 capsule (40 mg total) by mouth daily.   ondansetron (ZOFRAN) 4 MG tablet, Take 1 tablet (4 mg total) by mouth every 8 (eight) hours as needed for nausea or vomiting.  Medical History:  Past Medical History:   Diagnosis Date   Congenitally short metatarsal    Bilateral    Eczema    Gastritis    Headache(784.0)    Tonsillitis    Allergies:  Allergies  Allergen Reactions   Shrimp [Shellfish Allergy] Swelling   Pseudoephedrine Itching and Rash    REACTION: face itched, rash   Tramadol Nausea Only    DIZZINESS AND FAINT FEELING     Surgical History:  She  has a past surgical history that includes Tonsillectomy and Cholecystectomy. Family History:  Her family history includes Breast cancer in her maternal aunt and maternal grandmother; Cancer in her maternal uncle. Social History:   reports that she has never smoked. She has never used smokeless tobacco. She reports current alcohol use. She reports that she does not use drugs.  REVIEW OF SYSTEMS  : All other systems reviewed and negative except where noted in the History of Present Illness.   PHYSICAL EXAM: BP 128/72   Pulse 65   Ht '5\' 4"'$  (1.626 m)   Wt 172 lb (78 kg)   BMI 29.52 kg/m  General:   Pleasant, well developed  female in no acute distress Head:   Normocephalic and atraumatic. Eyes:  sclerae anicteric,conjunctive pink  Heart:   regular rate and rhythm Pulm:  Clear anteriorly; no wheezing Abdomen:   Soft, Non-distended AB, Active bowel sounds. mild tenderness in the epigastrium. Without guarding and Without rebound, No organomegaly appreciated. Rectal: Not evaluated Extremities:  Without edema. Msk: Symmetrical without gross deformities. Peripheral pulses intact.  Neurologic:  Alert and  oriented x4;  No focal deficits.  Skin:   Dry and intact without significant lesions or rashes. Psychiatric:  Cooperative. Normal mood and affect.    Vladimir Crofts, PA-C 10:38 AM

## 2021-10-23 ENCOUNTER — Other Ambulatory Visit (INDEPENDENT_AMBULATORY_CARE_PROVIDER_SITE_OTHER): Payer: 59

## 2021-10-23 ENCOUNTER — Encounter: Payer: Self-pay | Admitting: Physician Assistant

## 2021-10-23 ENCOUNTER — Ambulatory Visit (INDEPENDENT_AMBULATORY_CARE_PROVIDER_SITE_OTHER): Payer: 59 | Admitting: Physician Assistant

## 2021-10-23 VITALS — BP 128/72 | HR 65 | Ht 64.0 in | Wt 172.0 lb

## 2021-10-23 DIAGNOSIS — R198 Other specified symptoms and signs involving the digestive system and abdomen: Secondary | ICD-10-CM

## 2021-10-23 DIAGNOSIS — R7989 Other specified abnormal findings of blood chemistry: Secondary | ICD-10-CM

## 2021-10-23 DIAGNOSIS — R1033 Periumbilical pain: Secondary | ICD-10-CM | POA: Diagnosis not present

## 2021-10-23 DIAGNOSIS — R1013 Epigastric pain: Secondary | ICD-10-CM

## 2021-10-23 DIAGNOSIS — R112 Nausea with vomiting, unspecified: Secondary | ICD-10-CM

## 2021-10-23 LAB — CBC WITH DIFFERENTIAL/PLATELET
Basophils Absolute: 0 10*3/uL (ref 0.0–0.1)
Basophils Relative: 0.8 % (ref 0.0–3.0)
Eosinophils Absolute: 0.1 10*3/uL (ref 0.0–0.7)
Eosinophils Relative: 2.5 % (ref 0.0–5.0)
HCT: 35.5 % — ABNORMAL LOW (ref 36.0–46.0)
Hemoglobin: 11.9 g/dL — ABNORMAL LOW (ref 12.0–15.0)
Lymphocytes Relative: 30.2 % (ref 12.0–46.0)
Lymphs Abs: 1.7 10*3/uL (ref 0.7–4.0)
MCHC: 33.4 g/dL (ref 30.0–36.0)
MCV: 88.1 fl (ref 78.0–100.0)
Monocytes Absolute: 0.3 10*3/uL (ref 0.1–1.0)
Monocytes Relative: 5 % (ref 3.0–12.0)
Neutro Abs: 3.4 10*3/uL (ref 1.4–7.7)
Neutrophils Relative %: 61.5 % (ref 43.0–77.0)
Platelets: 291 10*3/uL (ref 150.0–400.0)
RBC: 4.03 Mil/uL (ref 3.87–5.11)
RDW: 14.1 % (ref 11.5–15.5)
WBC: 5.5 10*3/uL (ref 4.0–10.5)

## 2021-10-23 LAB — COMPREHENSIVE METABOLIC PANEL
ALT: 12 U/L (ref 0–35)
AST: 14 U/L (ref 0–37)
Albumin: 4.7 g/dL (ref 3.5–5.2)
Alkaline Phosphatase: 59 U/L (ref 39–117)
BUN: 15 mg/dL (ref 6–23)
CO2: 26 mEq/L (ref 19–32)
Calcium: 9.2 mg/dL (ref 8.4–10.5)
Chloride: 108 mEq/L (ref 96–112)
Creatinine, Ser: 0.95 mg/dL (ref 0.40–1.20)
GFR: 77.9 mL/min (ref >60.00)
Glucose, Bld: 96 mg/dL (ref 70–99)
Potassium: 3.7 mEq/L (ref 3.5–5.1)
Sodium: 140 mEq/L (ref 135–145)
Total Bilirubin: 0.5 mg/dL (ref 0.2–1.2)
Total Protein: 7.3 g/dL (ref 6.0–8.3)

## 2021-10-23 LAB — IBC + FERRITIN
Ferritin: 188.5 ng/mL (ref 10.0–291.0)
Iron: 70 ug/dL (ref 42–145)
Saturation Ratios: 17.9 % — ABNORMAL LOW (ref 20.0–50.0)
TIBC: 390.6 ug/dL (ref 250.0–450.0)
Transferrin: 279 mg/dL (ref 212.0–360.0)

## 2021-10-23 LAB — HIGH SENSITIVITY CRP: CRP, High Sensitivity: 3.15 mg/L (ref 0.000–5.000)

## 2021-10-23 LAB — SEDIMENTATION RATE: Sed Rate: 18 mm/hr (ref 0–20)

## 2021-10-23 MED ORDER — ONDANSETRON HCL 4 MG PO TABS: 4.0000 mg | ORAL_TABLET | Freq: Three times a day (TID) | ORAL | 1 refills | Status: DC | PRN

## 2021-10-23 MED ORDER — OMEPRAZOLE 40 MG PO CPDR: 40.0000 mg | DELAYED_RELEASE_CAPSULE | Freq: Every day | ORAL | 1 refills | Status: DC

## 2021-10-23 NOTE — Patient Instructions (Addendum)
   Your provider has requested that you go to the basement level for lab work before leaving today. Press "B" on the elevator. The lab is located at the first door on the left as you exit the elevator.  You have been scheduled for an endoscopy. Please follow written instructions given to you at your visit today. If you use inhalers (even only as needed), please bring them with you on the day of your procedure.   Please try low FODMAP diet- see below- start with just one column at a time.  First do a trial off milk/lactose products if you use them.  Add fiber like benefiber or citracel once a day Increase activity Can do trial of IBGard for AB pain- Take 1-2 capsules once a day for maintence or twice a day during a flare Will also send in an anti spasm medication to take as needed Please try to decrease stress. consider talking with PCP about anti anxiety medication or try head space app for meditation. if any worsening symptoms like blood in stool, weight loss, please call the office or go to the ER.     FODMAP stands for fermentable oligo-, di-, mono-saccharides and polyols (1). These are the scientific terms used to classify groups of carbs that are notorious for triggering digestive symptoms like bloating, gas and stomach pain.     Please take your proton pump inhibitor medication, prilosec 40 mg once a day  Please take this medication 30 minutes to 1 hour before meals- this makes it more effective.  Avoid spicy and acidic foods Avoid fatty foods Limit your intake of coffee, tea, alcohol, and carbonated drinks Work to maintain a healthy weight Keep the head of the bed elevated at least 3 inches with blocks or a wedge pillow if you are having any nighttime symptoms Stay upright for 2 hours after eating Avoid meals and snacks three to four hours before bedtime

## 2021-10-29 LAB — ALPHA-1-ANTITRYPSIN: A-1 Antitrypsin, Ser: 113 mg/dL (ref 83–199)

## 2021-10-29 LAB — EPSTEIN-BARR VIRUS VCA ANTIBODY PANEL
EBV NA IgG: 337 U/mL — ABNORMAL HIGH
EBV VCA IgG: 273 U/mL — ABNORMAL HIGH
EBV VCA IgM: <36 U/mL

## 2021-10-29 LAB — ANTI-SMOOTH MUSCLE ANTIBODY, IGG: Actin (Smooth Muscle) Antibody (IGG): <20 U (ref <20)

## 2021-10-29 LAB — MITOCHONDRIAL ANTIBODIES: Mitochondrial M2 Ab, IgG: <=20 U (ref <=20.0)

## 2021-10-29 LAB — TISSUE TRANSGLUTAMINASE, IGA: (tTG) Ab, IgA: <1 U/mL

## 2021-10-29 LAB — ANTI-NUCLEAR AB-TITER (ANA TITER)
ANA TITER: 1:1280 {titer} — ABNORMAL HIGH
ANA Titer 1: 1:1280 {titer} — ABNORMAL HIGH

## 2021-10-29 LAB — IGA: Immunoglobulin A: 118 mg/dL (ref 47–310)

## 2021-10-29 LAB — IGG: IgG (Immunoglobin G), Serum: 1143 mg/dL (ref 600–1640)

## 2021-10-29 LAB — ANA: Anti Nuclear Antibody (ANA): POSITIVE — AB

## 2021-10-29 LAB — CERULOPLASMIN: Ceruloplasmin: 27 mg/dL (ref 18–53)

## 2021-10-30 ENCOUNTER — Other Ambulatory Visit: Payer: Self-pay

## 2021-10-30 DIAGNOSIS — R7989 Other specified abnormal findings of blood chemistry: Secondary | ICD-10-CM

## 2021-10-30 DIAGNOSIS — R112 Nausea with vomiting, unspecified: Secondary | ICD-10-CM

## 2021-11-02 ENCOUNTER — Ambulatory Visit (INDEPENDENT_AMBULATORY_CARE_PROVIDER_SITE_OTHER)
Admission: RE | Admit: 2021-11-02 | Discharge: 2021-11-02 | Disposition: A | Discharge status: Home / Self Care | Payer: 59 | Source: Ambulatory Visit | Attending: Family Medicine | Admitting: Family Medicine

## 2021-11-02 ENCOUNTER — Ambulatory Visit (INDEPENDENT_AMBULATORY_CARE_PROVIDER_SITE_OTHER): Payer: 59 | Admitting: Family Medicine

## 2021-11-02 VITALS — BP 98/60 | HR 71 | Temp 97.5°F | Wt 174.5 lb

## 2021-11-02 DIAGNOSIS — M25532 Pain in left wrist: Secondary | ICD-10-CM

## 2021-11-02 MED ORDER — MELOXICAM 7.5 MG PO TABS: 7.5000 mg | ORAL_TABLET | Freq: Every day | ORAL | 0 refills | Status: DC

## 2021-11-02 NOTE — Patient Instructions (Addendum)
#  Referral I have placed a referral to a specialist for you. You should receive a phone call from the specialty office. Make sure your voicemail is not full and that if you are able to answer your phone to unknown or new numbers.   It may take up to 2 weeks to hear about the referral. If you do not hear anything in 2 weeks, please call our office and ask to speak with the referral coordinator.   Call GI or our office   Wrist - rest - ice  - meloxicam - ok to also take tylenol

## 2021-11-02 NOTE — Progress Notes (Signed)
   Subjective:     Kim Walsh is a 36 y.o. female presenting for Wrist Injury (L x 1 day. )     Wrist Injury  The incident occurred 12 to 24 hours ago. The incident occurred at home. Injury mechanism: fall down the steps, landed on thumb and wrist. The pain is present in the left wrist and right shoulder (and left thumb). The quality of the pain is described as aching. The pain does not radiate. The pain is at a severity of 5/10. The pain has been Improving since the incident. Pertinent negatives include no muscle weakness, numbness or tingling. The symptoms are aggravated by movement. She has tried ice and rest (topical pain reliever) for the symptoms. The treatment provided mild relief.   #Stomach issues - went to the ER - saw GI and several tests -   Review of Systems  Neurological:  Negative for tingling and numbness.     Social History   Tobacco Use  Smoking Status Never  Smokeless Tobacco Never        Objective:    BP Readings from Last 3 Encounters:  11/02/21 98/60  10/23/21 128/72  09/19/21 115/75   Wt Readings from Last 3 Encounters:  11/02/21 174 lb 8 oz (79.2 kg)  10/23/21 172 lb (78 kg)  09/19/21 172 lb (78 kg)    BP 98/60   Pulse 71   Temp (!) 97.5 F (36.4 C) (Temporal)   Wt 174 lb 8 oz (79.2 kg)   LMP 10/20/2021 (Exact Date)   SpO2 97%   BMI 29.95 kg/m    Physical Exam Constitutional:      General: She is not in acute distress.    Appearance: She is well-developed. She is not diaphoretic.  HENT:     Right Ear: External ear normal.     Left Ear: External ear normal.     Nose: Nose normal.  Eyes:     Conjunctiva/sclera: Conjunctivae normal.  Cardiovascular:     Rate and Rhythm: Normal rate.     Pulses: Normal pulses.  Pulmonary:     Effort: Pulmonary effort is normal.  Musculoskeletal:     Cervical back: Neck supple.     Comments: Left Wrist Inspection: no swelling or erythema Palpation: TTP along the base of the thumb  and along the thumb ROM: normal Strength: normal   Skin:    General: Skin is warm and dry.     Capillary Refill: Capillary refill takes less than 2 seconds.  Neurological:     Mental Status: She is alert. Mental status is at baseline.  Psychiatric:        Mood and Affect: Mood normal.        Behavior: Behavior normal.      Left wrist XR: no fracture     Assessment & Plan:   Problem List Items Addressed This Visit       Other   Left wrist pain - Primary    X-ray without sign of fracture on my read, will follow-up final.  This likely a sprain and to pick up a brace.  Continue with ice.  Meloxicam prescribed for pain control.      Relevant Medications   meloxicam (MOBIC) 7.5 MG tablet   Other Relevant Orders   DG Wrist Complete Left     Return if symptoms worsen or fail to improve.  Lesleigh Noe, MD

## 2021-11-02 NOTE — Assessment & Plan Note (Signed)
X-ray without sign of fracture on my read, will follow-up final.  This likely a sprain and to pick up a brace.  Continue with ice.  Meloxicam prescribed for pain control.

## 2021-11-13 ENCOUNTER — Ambulatory Visit (AMBULATORY_SURGERY_CENTER): Payer: 59 | Admitting: Internal Medicine

## 2021-11-13 ENCOUNTER — Encounter: Payer: Self-pay | Admitting: Internal Medicine

## 2021-11-13 VITALS — BP 121/70 | HR 78 | Temp 98.0°F | Resp 12 | Ht 64.0 in | Wt 172.0 lb

## 2021-11-13 DIAGNOSIS — Z800 Family history of malignant neoplasm of digestive organs: Secondary | ICD-10-CM

## 2021-11-13 DIAGNOSIS — R1013 Epigastric pain: Secondary | ICD-10-CM

## 2021-11-13 DIAGNOSIS — R112 Nausea with vomiting, unspecified: Secondary | ICD-10-CM | POA: Diagnosis not present

## 2021-11-13 DIAGNOSIS — K449 Diaphragmatic hernia without obstruction or gangrene: Secondary | ICD-10-CM

## 2021-11-13 MED ORDER — SODIUM CHLORIDE 0.9 % IV SOLN: 500.0000 mL | INTRAVENOUS | Status: DC

## 2021-11-13 NOTE — Progress Notes (Signed)
See office note dated 10/23/2021 for details and current H&P  Patient presenting for EGD for upper abdominal pain with nausea vomiting and family history of stomach cancer in the patient's mother  She is appropriate for [place] EGD today

## 2021-11-13 NOTE — Patient Instructions (Signed)
Information on hiatal hernia given to you today.  Await pathology results from the biopsies taken today.  Resume previous diet and medications.   YOU HAD AN ENDOSCOPIC PROCEDURE TODAY AT [facility]:   Refer to the procedure report that was given to you for any specific questions about what was found during the examination.  If the procedure report does not answer your questions, please call your gastroenterologist to clarify.  If you requested that your care partner not be given the details of your procedure findings, then the procedure report has been included in a sealed envelope for you to review at your convenience later.  YOU SHOULD EXPECT: Some feelings of bloating in the abdomen. Passage of more gas than usual.  Walking can help get rid of the air that was put into your GI tract during the procedure and reduce the bloating. If you had a lower endoscopy (such as a colonoscopy or flexible sigmoidoscopy) you may notice spotting of blood in your stool or on the toilet paper. If you underwent a bowel prep for your procedure, you may not have a normal bowel movement for a few days.  Please Note:  You might notice some irritation and congestion in your nose or some drainage.  This is from the oxygen used during your procedure.  There is no need for concern and it should clear up in a day or so.  SYMPTOMS TO REPORT IMMEDIATELY:   Following upper endoscopy (EGD)  Vomiting of blood or coffee ground material  New chest pain or pain under the shoulder blades  Painful or persistently difficult swallowing  New shortness of breath  Fever of 100F or higher  Black, tarry-looking stools  For urgent or emergent issues, a gastroenterologist can be reached at any hour by calling [phone]. Do not use MyChart messaging for urgent concerns.    DIET:  We do recommend a small meal at first, but then you may proceed to your regular diet.  Drink plenty of fluids but you should avoid  alcoholic beverages for 24 hours.  ACTIVITY:  You should plan to take it easy for the rest of today and you should NOT DRIVE or use heavy machinery until tomorrow (because of the sedation medicines used during the test).    FOLLOW UP: Our staff will call the number listed on your records the next business day following your procedure.  We will call around 7:15- 8:00 am to check on you and address any questions or concerns that you may have regarding the information given to you following your procedure. If we do not reach you, we will leave a message.  If you develop any symptoms (ie: fever, flu-like symptoms, shortness of breath, cough etc.) before then, please call [phone].  If you test positive for Covid 19 in the 2 weeks post procedure, please call and report this information to [place].    If any biopsies were taken you will be contacted by phone or by letter within the next 1-3 weeks.  Please call us at [phone] if you have not heard about the biopsies in 3 weeks.    SIGNATURES/CONFIDENTIALITY: You and/or your care partner have signed paperwork which will be entered into your electronic medical record.  These signatures attest to the fact that that the information above on your After Visit Summary has been reviewed and is understood.  Full responsibility of the confidentiality of this discharge information lies with you and/or your care-partner.

## 2021-11-13 NOTE — Op Note (Signed)
[place] Patient Name: Kim Walsh Procedure Date: 11/13/2021 10:02 AM MRN: [id number] Endoscopist: Jerene Bears , MD Age: 36 Referring MD:  Date of Birth: 02-09-1986 Gender: Female Account #: [id number] Procedure:                Upper GI endoscopy Indications:              Epigastric abdominal pain, Nausea with vomiting,                            Family history of gastric cancer Medicines:                Monitored Anesthesia Care Procedure:                Pre-Anesthesia Assessment:                           - Prior to the procedure, a History and Physical                            was performed, and patient medications and                            allergies were reviewed. The patient's tolerance of                            previous anesthesia was also reviewed. The risks                            and benefits of the procedure and the sedation                            options and risks were discussed with the patient.                            All questions were answered, and informed consent                            was obtained. Prior Anticoagulants: The patient has                            taken no previous anticoagulant or antiplatelet                            agents. ASA Grade Assessment: II - A patient with                            mild systemic disease. After reviewing the risks                            and benefits, the patient was deemed in                            satisfactory condition to undergo the procedure.  After obtaining informed consent, the endoscope was                            passed under direct vision. Throughout the                            procedure, the patient's blood pressure, pulse, and                            oxygen saturations were monitored continuously. The                            GIF [id number] #[id number] was introduced through the                            mouth, and advanced to  the second part of duodenum.                            The upper GI endoscopy was accomplished without                            difficulty. The patient tolerated the procedure                            well. Scope In: Scope Out: Findings:                 The examined esophagus was normal.                           A 3 cm hiatal hernia was present.                           The gastroesophageal flap valve was visualized                            endoscopically and classified as Hill Grade III                            (minimal fold, loose to endoscope, hiatal hernia                            likely).                           The entire examined stomach was normal. Biopsies                            were taken with a cold forceps for histology and                            Helicobacter pylori testing.                           The examined duodenum was normal. Biopsies for  histology were taken with a cold forceps for                            evaluation of celiac disease. Complications:            No immediate complications. Estimated Blood Loss:     Estimated blood loss was minimal. Impression:               - Normal esophagus.                           - 3 cm hiatal hernia.                           - Normal stomach. Biopsied.                           - Normal examined duodenum. Biopsied. Recommendation:           - Patient has a contact number available for                            emergencies. The signs and symptoms of potential                            delayed complications were discussed with the                            patient. Return to normal activities tomorrow.                            Written discharge instructions were provided to the                            patient.                           - Resume previous diet.                           - Continue present medications.                           - Await pathology  results.                           - LFTs have normalized. ANA very positive. I                            recommend rheumatology referral for evaluation of                            possible SLE which can be associated with abdominal                            pain/symptoms. Jerene Bears, MD 11/13/2021 10:19:03 AM This report has been signed electronically.

## 2021-11-13 NOTE — Progress Notes (Signed)
Pt's states no medical or surgical changes since previsit or office visit. 

## 2021-11-13 NOTE — Progress Notes (Signed)
Report to PACU, RN, vss, BBS= Clear.  

## 2021-11-13 NOTE — Progress Notes (Signed)
Called to room to assist during endoscopic procedure.  Patient ID and intended procedure confirmed with present staff. Received instructions for my participation in the procedure from the performing physician.  

## 2021-11-16 ENCOUNTER — Telehealth: Payer: Self-pay

## 2021-11-16 ENCOUNTER — Other Ambulatory Visit: Payer: Self-pay

## 2021-11-16 DIAGNOSIS — R768 Other specified abnormal immunological findings in serum: Secondary | ICD-10-CM

## 2021-11-16 NOTE — Telephone Encounter (Signed)
Pt referred to [facility] for eval of possible SLE.

## 2021-11-16 NOTE — Telephone Encounter (Signed)
  Follow up Call-     11/13/2021    9:26 AM  Call back number  Post procedure Call Back phone  # [phone]  Permission to leave phone message Yes    Post op call attempted, no answer, left WM.

## 2021-11-17 ENCOUNTER — Encounter: Payer: Self-pay | Admitting: Internal Medicine

## 2021-11-17 NOTE — Telephone Encounter (Signed)
Patient returned call, stated she is doing well.

## 2022-01-19 ENCOUNTER — Ambulatory Visit (INDEPENDENT_AMBULATORY_CARE_PROVIDER_SITE_OTHER): Payer: 59 | Admitting: Internal Medicine

## 2022-01-19 ENCOUNTER — Encounter: Payer: Self-pay | Admitting: Internal Medicine

## 2022-01-19 ENCOUNTER — Ambulatory Visit: Payer: 59 | Admitting: Family Medicine

## 2022-01-19 VITALS — BP 110/70 | HR 114 | Temp 97.5°F | Ht 64.0 in | Wt 176.0 lb

## 2022-01-19 DIAGNOSIS — J029 Acute pharyngitis, unspecified: Secondary | ICD-10-CM

## 2022-01-19 DIAGNOSIS — B349 Viral infection, unspecified: Secondary | ICD-10-CM | POA: Insufficient documentation

## 2022-01-19 LAB — POC INFLUENZA A&B (BINAX/QUICKVUE)
Influenza A, POC: NEGATIVE
Influenza B, POC: NEGATIVE

## 2022-01-19 LAB — POC COVID19 BINAXNOW: SARS Coronavirus 2 Ag: NEGATIVE

## 2022-01-19 NOTE — Assessment & Plan Note (Signed)
Mostly systemic symptoms of aching, chills, etc Mild respiratory symptoms  Flu/COVID negative Discussed supportive care---ibuprofen/tylenol Nothing to suggest bacterial infection

## 2022-01-19 NOTE — Progress Notes (Signed)
Subjective:    Patient ID: Kim Walsh, female    DOB: 08-28-1985, 36 y.o.   MRN: [id number]  HPI Here due to respiratory illness With sister  Tickle in throat 3 days ago Woke 2 days ago---bad body aches, chills, fever/sweating Felt congested and post nasal drip Some cough and headache Neck felt sore also No SOB--but heard some wheezing last night (but not today)  Slept all day--husband watched baby Slept again yesterday---took 2 hours to get child for day care  Tried ibuprofen, nyquil Pedialyte and zycam also No better today ----prompting visit  Did COVID test at home---negative  Current Outpatient Medications on File Prior to Visit  Medication Sig Dispense Refill   acyclovir (ZOVIRAX) 200 MG capsule Take 2 capsules (400 mg total) by mouth 4 (four) times daily as needed (for lip swelling). 30 capsule 5   albuterol (VENTOLIN HFA) 108 (90 Base) MCG/ACT inhaler Inhale 2 puffs into the lungs every 6 (six) hours as needed for wheezing or shortness of breath. 8 g 3   cholecalciferol (VITAMIN D) 25 MCG (1000 UT) tablet Take 2,000 Units by mouth daily.     dicyclomine (BENTYL) 20 MG tablet Take 1 tablet (20 mg total) by mouth every 6 (six) hours as needed (Abdominal cramping). 20 tablet 2   meloxicam (MOBIC) 7.5 MG tablet Take 1 tablet (7.5 mg total) by mouth daily. 30 tablet 0   mometasone (ELOCON) 0.1 % cream Apply topically daily. Apply to affected area for eczema once daily as needed - small amount. 45 g 3   omeprazole (PRILOSEC) 40 MG capsule Take 1 capsule (40 mg total) by mouth daily. 90 capsule 1   ondansetron (ZOFRAN) 4 MG tablet Take 1 tablet (4 mg total) by mouth every 8 (eight) hours as needed for nausea or vomiting. 39 tablet 1   SUMAtriptan (IMITREX) 100 MG tablet Take 1 tablet (100 mg total) by mouth every 2 (two) hours as needed for migraine. May repeat in 2 hours if headache persists or recurs. 10 tablet 11   No current facility-administered medications on  file prior to visit.    Allergies  Allergen Reactions   Shrimp [Shellfish Allergy] Swelling   Pseudoephedrine Itching and Rash    REACTION: face itched, rash   Tramadol Nausea Only    DIZZINESS AND FAINT FEELING    Past Medical History:  Diagnosis Date   Congenitally short metatarsal    Bilateral    Eczema    Gastritis    Headache(784.0)    Tonsillitis     Past Surgical History:  Procedure Laterality Date   CHOLECYSTECTOMY     TONSILLECTOMY      Family History  Problem Relation Age of Onset   Breast cancer Maternal Grandmother    Breast cancer Maternal Aunt    Cancer Maternal Uncle        Non Hodgkins/Colon    Social History   Socioeconomic History   Marital status: Single    Spouse name: Not on file   Number of children: 0   Years of education: Not on file   Highest education level: Not on file  Occupational History   Occupation: CNA at [employer]  Tobacco Use   Smoking status: Never   Smokeless tobacco: Never  Vaping Use   Vaping Use: Never used  Substance and Sexual Activity   Alcohol use: Yes    Alcohol/week: 0.0 standard drinks of alcohol    Comment: occassionally wine   Drug use:  No   Sexual activity: Yes    Partners: Male    Birth control/protection: Condom  Other Topics Concern   Not on file  Social History Narrative   Not on file   Social Determinants of Health   Financial Resource Strain: Not on file  Food Insecurity: Not on file  Transportation Needs: Not on file  Physical Activity: Not on file  Stress: Not on file  Social Connections: Not on file  Intimate Partner Violence: Not on file   Review of Systems No N/V--but felt slightly queasy On pepcid due to hiatal hernia No abdominal pain now---uses bentyl for rare spells Able to eat a little Hurts all over--but not clearly joint swelling    Objective:   Physical Exam Constitutional:      Appearance: Normal appearance.  HENT:     Right Ear: Tympanic membrane and  ear canal normal.     Ears:     Comments: Cerumen in right    Mouth/Throat:     Comments: Very slight erythema on uvula--not otherwise Pulmonary:     Effort: Pulmonary effort is normal.     Breath sounds: Normal breath sounds. No wheezing or rales.  Musculoskeletal:     Cervical back: Neck supple.  Lymphadenopathy:     Cervical: No cervical adenopathy.  Neurological:     Mental Status: She is alert.            Assessment & Plan:

## 2022-01-26 ENCOUNTER — Telehealth: Payer: Self-pay | Admitting: Family Medicine

## 2022-01-26 MED ORDER — AMOXICILLIN 875 MG PO TABS: 875.0000 mg | ORAL_TABLET | Freq: Two times a day (BID) | ORAL | 0 refills | Status: AC

## 2022-01-26 NOTE — Telephone Encounter (Signed)
Spoke to pt. She would like to start the antibiotic. Sent to [facility] as requested by pt.

## 2022-01-26 NOTE — Addendum Note (Signed)
Addended by: Pilar Grammes on: 01/26/2022 04:14 PM   Modules accepted: Orders

## 2022-01-26 NOTE — Telephone Encounter (Signed)
Patient called in to give Dr Silvio Pate an update from last weeks appointment. She thinks from the congestion and drainage,her ears are hurting now so she may have an ear infection.Patient is requesting a phone call.

## 2022-01-29 ENCOUNTER — Telehealth: Payer: Self-pay | Admitting: Family Medicine

## 2022-01-29 NOTE — Telephone Encounter (Signed)
I put the letter in my chart  Let us know if she cannot see it

## 2022-01-29 NOTE — Telephone Encounter (Signed)
Will route to Dr. Damita Dunnings who is here in the office since PCP and well as Dr. Silvio Pate are out of the office today.  Will also route to PCP incase she sees message at home

## 2022-01-29 NOTE — Telephone Encounter (Signed)
Thanks.  I'll defer.

## 2022-01-29 NOTE — Telephone Encounter (Signed)
Pt notified of Dr. Marliss Coots comments. Activation code for mychart sent to pt so she can view letter on [employer]

## 2022-01-29 NOTE — Telephone Encounter (Signed)
Patient called in and stated she seen Dr. Silvio Pate. She need a note for work stating that she needs an extension for the flu shot because of symptoms she has been having and not been able to get the shot yet. She stated that it can be emailed to her so she can upload it to her job website. She needs it done by today if possible. Please advise. Thank you!

## 2022-05-05 ENCOUNTER — Ambulatory Visit: Payer: 59 | Admitting: Primary Care

## 2022-05-14 NOTE — Progress Notes (Signed)
Office Visit Note  Patient: Kim Walsh             Date of Birth: 08/17/1985           MRN: [id number]             PCP: [name], MD Referring: Vladimir Crofts, PA-C Visit Date: 05/28/2022 Occupation: '@GUAROCC'$ @  Subjective:  Positive ANA   History of Present Illness: Kim Walsh is a 37 y.o. female seen in consultation per request of her gastroenterologist.  Patient states that when she was in high school she started having muscle pains in her arms and her back.  She had to miss school frequently.  She was seen by rheumatologist at the time who gave her a diagnosis of fibromyalgia syndrome.  She states she dealt with her symptoms over the years.  While she was in college she started having nausea vomiting and abdominal discomfort.  She states she had extensive workup in the past and was told that she had a gallbladder polyp.  5 years ago she underwent cholecystectomy and her symptoms improved but recurred soon with ongoing nausea vomiting and abdominal discomfort.  She states she had several ER visits including CT scan of her abdomen.  At 1 time her liver functions were elevated but they normalized.  She has been under care of Dr. Hilarie Fredrickson recently for ongoing abdominal discomfort and pain.  She states she had an endoscopy which showed a small hiatal hernia.  She also had a colonoscopy which showed mild inflammation in the distal terminal ileum.  The biopsy was consistent with ileitis.  I reviewed the records and Dr. Hilarie Fredrickson mentioned that it is possible that she may have mild Crohn's disease.  She was given a prescription of Entocort 9 mg p.o. daily.  Patient states she has not started the prescription yet.  When she was seen by her PCP in July 2023 she also had some lab work which showed positive ANA.  She was referred to me for the evaluation of positive ANA.  She denies any history of sicca symptoms, Raynaud's phenomenon, lymphadenopathy, malar rash, photosensitivity  or inflammatory arthritis.  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  There is no family history of systemic lupus.  Patient states that her mother had rheumatoid arthritis and required IV infusions.  Her mother was diagnosed with a stage IV stomach cancer.  Patient is gravida 1, para 1.  She denies any history of eclampsia or preeclampsia during the pregnancy.    Activities of Daily Living:  Patient reports morning stiffness for 0 minutes.   Patient Denies nocturnal pain.  Difficulty dressing/grooming: Denies Difficulty climbing stairs: Denies Difficulty getting out of chair: Denies Difficulty using hands for taps, buttons, cutlery, and/or writing: Denies  Review of Systems  Constitutional:  Positive for fatigue.  HENT:  Positive for mouth sores. Negative for mouth dryness.        Herpes  Eyes: Negative.  Negative for dryness.  Respiratory: Negative.  Negative for shortness of breath.   Cardiovascular:  Negative for palpitations.  Gastrointestinal:  Positive for constipation and diarrhea. Negative for blood in stool.  Endocrine: Negative.  Negative for increased urination.  Genitourinary: Negative.  Negative for involuntary urination.  Musculoskeletal:  Positive for myalgias, muscle weakness, muscle tenderness and myalgias. Negative for joint pain, gait problem, joint pain, joint swelling and morning stiffness.  Skin:  Positive for hair loss. Negative for  color change, rash and sensitivity to sunlight.  Allergic/Immunologic: Negative.  Negative for susceptible to infections.  Neurological:  Positive for dizziness and headaches.  Hematological:  Negative for swollen glands.  Psychiatric/Behavioral:  Positive for sleep disturbance. Negative for depressed mood. The patient is nervous/anxious.     PMFS History:  Patient Active Problem List   Diagnosis Date Noted   Viral syndrome 01/19/2022   Left wrist pain 11/02/2021   Viral URI with cough  08/25/2021   Sore throat 08/21/2021   Cyst of skin 06/24/2021   Kidney lesion, native, right 05/07/2021   Abdominal pain 05/07/2021   Productive cough 05/07/2021   Elevated LFTs 05/07/2021   Grief reaction 05/07/2021   Acute cough 05/05/2021   Fibroid 11/28/2018   Heavy menses 11/24/2018   Lip swelling 11/24/2018   Lower thoracic back pain 01/11/2017   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   Plantar fasciitis, bilateral 03/13/2015   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   Routine general medical examination at a health care facility 03/06/2012   Atopic dermatitis 09/07/2010   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   Change in hearing 07/16/2010   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/02/2006   SICKLE-CELL TRAIT 08/02/2006   Eczema 08/02/2006   Migraine 08/02/2006   ANA POSITIVE 08/02/2006    Past Medical History:  Diagnosis Date   Congenitally short metatarsal    Bilateral    Eczema    Gastritis    Headache(784.0)    Positive ANA (antinuclear antibody)    Tonsillitis     Family History  Problem Relation Age of Onset   Breast cancer Maternal Grandmother    Breast cancer Maternal Aunt    Cancer Maternal Uncle        Non Hodgkins/Colon   Past Surgical History:  Procedure Laterality Date   CHOLECYSTECTOMY     TONSILLECTOMY     Social History   Social History Narrative   Not on file   Immunization History  Administered Date(s) Administered   Hpv-Unspecified 11/06/2007, 11/18/2008, 02/20/2009, 12/12/2009   Influenza, Seasonal, Injecte, Preservative Fre 01/08/2016, 01/05/2017, 01/19/2018, 01/16/2019, 01/15/2020, 02/02/2021   Meningococcal polysaccharide vaccine (MPSV4) 02/20/2009   PFIZER(Purple Top)SARS-COV-2 Vaccination 04/26/2019, 05/16/2019   Td 11/18/2008     Objective: Vital Signs: BP 116/68 (BP Location: Right Arm, Patient Position: Sitting, Cuff Size: Normal)   Pulse 81   Resp 16   Ht 5' 4.5" (1.638 m)   Wt 169 lb (76.7 kg)   BMI 28.56 kg/m    Physical Exam Vitals and nursing  note reviewed.  Constitutional:      Appearance: She is well-developed.  HENT:     Head: Normocephalic and atraumatic.  Eyes:     Conjunctiva/sclera: Conjunctivae normal.  Cardiovascular:     Rate and Rhythm: Normal rate and regular rhythm.     Heart sounds: Normal heart sounds.  Pulmonary:     Effort: Pulmonary effort is normal.     Breath sounds: Normal breath sounds.  Abdominal:     General: Bowel sounds are normal.     Palpations: Abdomen is soft.  Musculoskeletal:     Cervical back: Normal range of motion.  Lymphadenopathy:     Cervical: No cervical adenopathy.  Skin:    General: Skin is warm and dry.     Capillary Refill: Capillary refill takes less than 2 seconds.  Neurological:     Mental Status: She is alert and oriented to person, place, and time.  Psychiatric:        Behavior: Behavior normal.  Musculoskeletal Exam: Cervical, thoracic and lumbar spine were in good range of motion.  She had no point tenderness.  Shoulder joints, elbow joints, wrist joints, MCPs PIPs and DIPs were in good range of motion with no synovitis.  Hip joints, knee joints, ankles, MTPs and PIPs been good range of motion with no synovitis.  CDAI Exam: CDAI Score: -- Patient Global: --; Provider Global: -- Swollen: --; Tender: -- Joint Exam 05/28/2022   No joint exam has been documented for this visit   There is currently no information documented on the homunculus. Go to the Rheumatology activity and complete the homunculus joint exam.  Investigation: No additional findings.  Imaging: No results found.  Recent Labs: Lab Results  Component Value Date   WBC 5.5 10/23/2021   HGB 11.9 (L) 10/23/2021   PLT 291.0 10/23/2021   NA 140 10/23/2021   K 3.7 10/23/2021   CL 108 10/23/2021   CO2 26 10/23/2021   GLUCOSE 96 10/23/2021   BUN 15 10/23/2021   CREATININE 0.95 10/23/2021   BILITOT 0.5 10/23/2021   ALKPHOS 59 10/23/2021   AST 14 10/23/2021   ALT 12 10/23/2021   PROT 7.3  10/23/2021   ALBUMIN 4.7 10/23/2021   CALCIUM 9.2 10/23/2021   GFRAA 97 11/28/2018    Speciality Comments: No specialty comments available.  Procedures:  No procedures performed Allergies: Shrimp [shellfish allergy], Pseudoephedrine, and Tramadol   Assessment / Plan:     Visit Diagnoses: Positive ANA (antinuclear antibody) - 10/23/21: ANA 1:1280NH, 1:1280 NS, ESR 18, CRP 3.150, tTG<1, actin ab<20, M2 ab0, ceruloplasmin WNL, EBV VCA IgG 273 -patient was found to have positive ANA by her PCP in July 2023.  Patient denies any history of sicca symptoms, malar rash, photosensitivity, Raynaud's phenomenon, inflammatory arthritis or lymphadenopathy.  She gets to oral ulcers related to herpes.  She had no synovitis on examination.  Patient states she has had myalgias for many years since she was in high school.  In the past she was diagnosed with fibromyalgia syndrome by another rheumatologist.  I will obtain additional labs to evaluate for positive ANA today.  Plan: Sedimentation rate, ANA, Anti-scleroderma antibody, RNP Antibody, Anti-Smith antibody, Sjogrens syndrome-A extractable nuclear antibody, Sjogrens syndrome-B extractable nuclear antibody, Anti-DNA antibody, double-stranded, C3 and C4, Beta-2 glycoprotein antibodies, Cardiolipin antibodies, IgG, IgM, IgA, Lupus Anticoagulant Eval w/Reflex, Thyroglobulin antibody, Thyroid Peroxidase Antibodies (TPO) (REFL)  Plantar fasciitis, bilateral - several years ago with no recurrence.  Lower thoracic back pain-patient states that she has had intermittent thoracic pain over the several years.  She had no point tenderness and had good mobility in her thoracic spine.  Other fatigue -she gives history of fatigue and myalgias.  I will obtain following labs today.  Plan: CBC with Differential/Platelet, COMPLETE METABOLIC PANEL WITH GFR, CK, Glucose 6 phosphate dehydrogenase, TSH  Myalgia-she gives history of intermittent discomfort in her arms.  She denies any  muscular pain today.  She had no muscular weakness.  Ileitis-patient has been under care of Dr. Hilarie Fredrickson.  Colonoscopy showed mild inflammation in the distal terminal ileum.  Biopsy was consistent with ileitis.  I reviewed Dr. Vena Rua notes and there was mention of possible mild Crohn's disease.  A prescription for Entocort 9 mg p.o. daily was given.  Patient has not started Entocort yet.  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  Her mother was also diagnosed with stage IV stomach cancer per patient.  Orders: Orders Placed This Encounter  Procedures   CBC with Differential/Platelet   COMPLETE METABOLIC PANEL WITH GFR   CK   Sedimentation rate   ANA   Anti-scleroderma antibody   RNP Antibody   Anti-Smith antibody   Sjogrens syndrome-A extractable nuclear antibody   Sjogrens syndrome-B extractable nuclear antibody   Anti-DNA antibody, double-stranded   C3 and C4   Beta-2 glycoprotein antibodies   Cardiolipin antibodies, IgG, IgM, IgA   Lupus Anticoagulant Eval w/Reflex   Glucose 6 phosphate dehydrogenase   TSH   Thyroglobulin antibody   Thyroid Peroxidase Antibodies (TPO) (REFL)   No orders of the defined types were placed in this encounter.    Follow-Up Instructions: Return for Positive ANA and myalgia.   Bo Merino, MD  Note - This record has been created using Editor, commissioning.  Chart creation errors have been sought, but may not always  have been located. Such creation errors do not reflect on  the standard of medical care.

## 2022-05-18 ENCOUNTER — Ambulatory Visit (INDEPENDENT_AMBULATORY_CARE_PROVIDER_SITE_OTHER): Payer: 59 | Admitting: Nurse Practitioner

## 2022-05-18 ENCOUNTER — Encounter: Payer: Self-pay | Admitting: Nurse Practitioner

## 2022-05-18 VITALS — BP 110/76 | HR 86 | Ht 64.0 in | Wt 175.1 lb

## 2022-05-18 DIAGNOSIS — R112 Nausea with vomiting, unspecified: Secondary | ICD-10-CM | POA: Diagnosis not present

## 2022-05-18 DIAGNOSIS — R197 Diarrhea, unspecified: Secondary | ICD-10-CM

## 2022-05-18 MED ORDER — ONDANSETRON HCL 4 MG PO TABS: 4.0000 mg | ORAL_TABLET | Freq: Three times a day (TID) | ORAL | 3 refills | Status: DC | PRN

## 2022-05-18 MED ORDER — NA SULFATE-K SULFATE-MG SULF 17.5-3.13-1.6 GM/177ML PO SOLN: 1.0000 | Freq: Once | ORAL | 0 refills | Status: AC

## 2022-05-18 NOTE — Progress Notes (Signed)
Addendum: Reviewed and agree with assessment and management plan. With very positive ANA symptoms may in fact be related to autoimmune disease.  Agree with the need for rheumatology opinion. Colonoscopy is reasonable. Upper abdominal pain with nausea and vomiting could be functional but need to exclude rheumatologic disease first. Kaeden Mester, Lajuan Lines, MD

## 2022-05-18 NOTE — Patient Instructions (Signed)
You have been scheduled for a colonoscopy. Please follow written instructions given to you at your visit today.  Please pick up your prep supplies at the pharmacy within the next 1-3 days. If you use inhalers (even only as needed), please bring them with you on the day of your procedure.  We have sent the following medications to your pharmacy for you to pick up at your convenience: Zofran  Due to recent changes in healthcare laws, you may see the results of your imaging and laboratory studies on MyChart before your provider has had a chance to review them.  We understand that in some cases there may be results that are confusing or concerning to you. Not all laboratory results come back in the same time frame and the provider may be waiting for multiple results in order to interpret others.  Please give [place] 48 hours in order for your provider to thoroughly review all the results before contacting the office for clarification of your results.    I appreciate the opportunity to care for you. Tye Savoy NP-C

## 2022-05-18 NOTE — Progress Notes (Signed)
Assessment    # 37 yo female with chronic episodic nausea , vomiting, upper abdominal pain and diarrhea. CT scan and EGD with gastric and duodenal biopsies were unremarkable. She has had a cholecystectomy. She is concerned, somewhat frustrated about the lack of diagnosis. The episodic nature of symptoms is perplexing. Functional disease? IBD seems unlikely.   # Positive ANA with high titer of 1:1,280. She is still awaiting Rheumatology evaluation.   Plan   For further evaluation of ongoing episodes of N/V/D and abdominal pain will proceed with colonoscopy.  Schedule for a colonoscopy. The risks and benefits of colonoscopy with possible polypectomy / biopsies were discussed and the patient agrees to proceed.  Refill Zofran    HPI    Chief complaint:  nausea, vomiting and diarrhea.    Kim Walsh is a 37 y.o. female known to Dr. Hilarie Fredrickson with a past medical history of cholecystectomy chronic nausea, vomiting. See PMH / Kim Walsh for additional details.     Patient was seen by Vicie Mutters, PA  10/23/21 for abdominal pain, N/V, [facility] of gastric cancer, elevated LFTs,  and alternating bowel habits. Please refer to that note for details.  She subsequently underwent an EGD with gastric and duodenal biopsies which was unremarkable.  For evaluation of abnormal liver tests a hepatic serologic workup was done and negative except for a positive ANA with high titer. She discussed this with her PCP and at some point is suppose to have a Rheumatology evaluation.   Interval History: Kim Walsh tells me she has chronic nausea / vomiting and upper abdominal pain which over the last year has been accompanied by diarrhea.  Symptoms transiently improved after cholecystectomy.  Episodes used to occur about  once a month but are becoming more frequent and symptoms can last up to 1.5 days.  She is a newlywed with a baby and her quality of life is being impacted. In between these episodes her stools are soft and  she feels fine except for occasional nausea.  She doesn't know of anything that triggers the symptoms.  She is concerned about an underlying disease process that hasn't been diagnosed. Her weight is stable.    Previous GI Evaluation  EGD Aug 2023 -3 cm hiatal hernia.  Entire stomach was normal.  Biopsies taken for H. pylori.  Examined duodenum was normal.  Biopsies taken to evaluate for celiac.   Diagnosis 1. Surgical [P], duodenal biopsy FRAGMENTS OF NORMAL DUODENAL MUCOSA. THERE ARE NO DIAGNOSTIC FEATURES OF CELIAC DISEASE. 2. Surgical [P], gastric antrum and gastric body FRAGMENTS OF GASTRIC MUCOSA WITH NO SIGNIFICANT MICROSCOPIC ABNORMALITIES. H. PYLORI, INTESTINAL METAPLASIA, ATROPHY AND DYSPLASIA ARE NOT IDENTIFIED.   Labs:     Latest Ref Rng & Units 10/23/2021   11:24 AM 09/19/2021   11:31 AM 05/06/2021    3:45 AM  CBC  WBC 4.0 - 10.5 K/uL 5.5  7.7  7.1   Hemoglobin 12.0 - 15.0 g/dL 11.9  13.3  12.5   Hematocrit 36.0 - 46.0 % 35.5  39.3  37.2   Platelets 150.0 - 400.0 K/uL 291.0  329  311        Latest Ref Rng & Units 10/23/2021   11:24 AM 09/19/2021   11:31 AM 06/24/2021   10:59 AM  Hepatic Function  Total Protein 6.0 - 8.3 g/dL 7.3  8.6  6.8   Albumin 3.5 - 5.2 g/dL 4.7  5.0  4.4   AST 0 - 37 U/L 14  24  14   ALT 0 - 35 U/L 12  31  14   $ Alk Phosphatase 39 - 117 U/L 59  60  61   Total Bilirubin 0.2 - 1.2 mg/dL 0.5  1.0  0.7   Bilirubin, Direct 0.0 - 0.3 mg/dL   0.1      Past Medical History:  Diagnosis Date   Congenitally short metatarsal    Bilateral    Eczema    Gastritis    Headache(784.0)    Positive ANA (antinuclear antibody)    Tonsillitis     Past Surgical History:  Procedure Laterality Date   CHOLECYSTECTOMY     TONSILLECTOMY      Current Medications, Allergies, Family History and Social History were reviewed in [employer] record.     Current Outpatient Medications  Medication Sig Dispense Refill   acyclovir (ZOVIRAX)  200 MG capsule Take 2 capsules (400 mg total) by mouth 4 (four) times daily as needed (for lip swelling). 30 capsule 5   albuterol (VENTOLIN HFA) 108 (90 Base) MCG/ACT inhaler Inhale 2 puffs into the lungs every 6 (six) hours as needed for wheezing or shortness of breath. 8 g 3   cholecalciferol (VITAMIN D) 25 MCG (1000 UT) tablet Take 2,000 Units by mouth daily.     dicyclomine (BENTYL) 20 MG tablet Take 1 tablet (20 mg total) by mouth every 6 (six) hours as needed (Abdominal cramping). 20 tablet 2   meloxicam (MOBIC) 7.5 MG tablet Take 1 tablet (7.5 mg total) by mouth daily. 30 tablet 0   mometasone (ELOCON) 0.1 % cream Apply topically daily. Apply to affected area for eczema once daily as needed - small amount. 45 g 3   omeprazole (PRILOSEC) 40 MG capsule Take 1 capsule (40 mg total) by mouth daily. 90 capsule 1   ondansetron (ZOFRAN) 4 MG tablet Take 1 tablet (4 mg total) by mouth every 8 (eight) hours as needed for nausea or vomiting. 39 tablet 1   SUMAtriptan (IMITREX) 100 MG tablet Take 1 tablet (100 mg total) by mouth every 2 (two) hours as needed for migraine. May repeat in 2 hours if headache persists or recurs. 10 tablet 11   No current facility-administered medications for this visit.    Review of Systems: No chest pain. No shortness of breath. No urinary complaints.    Physical Exam  Wt Readings from Last 3 Encounters:  01/19/22 176 lb (79.8 kg)  11/13/21 172 lb (78 kg)  11/02/21 174 lb 8 oz (79.2 kg)    BP 110/76 (BP Location: Left Arm, Patient Position: Sitting, Cuff Size: Normal)   Pulse 86   Ht 5' 4"$  (1.626 m)   Wt 175 lb 2 oz (79.4 kg)   SpO2 98%   BMI 30.06 kg/m  Constitutional:  Pleasant, generally well appearing female in no acute distress. Psychiatric: Normal mood and affect. Behavior is normal. EENT: Pupils normal.  Conjunctivae are normal. No scleral icterus. Neck supple.  Cardiovascular: Normal rate, regular rhythm.  Pulmonary/chest: Effort normal and  breath sounds normal. No wheezing, rales or rhonchi. Abdominal: Soft, nondistended, nontender. Bowel sounds active throughout. There are no masses palpable. No hepatomegaly. Neurological: Alert and oriented to person place and time.  Skin: Skin is warm and dry. No rashes noted.  Tye Savoy, NP  05/18/2022, 8:22 AM  Cc:  Tower, [name], MD

## 2022-05-24 ENCOUNTER — Telehealth: Payer: Self-pay

## 2022-05-24 ENCOUNTER — Ambulatory Visit (AMBULATORY_SURGERY_CENTER): Payer: 59 | Admitting: Internal Medicine

## 2022-05-24 ENCOUNTER — Encounter: Payer: Self-pay | Admitting: Internal Medicine

## 2022-05-24 VITALS — BP 112/64 | HR 60 | Temp 98.4°F | Resp 12 | Ht 64.0 in | Wt 175.0 lb

## 2022-05-24 DIAGNOSIS — R112 Nausea with vomiting, unspecified: Secondary | ICD-10-CM

## 2022-05-24 DIAGNOSIS — K529 Noninfective gastroenteritis and colitis, unspecified: Secondary | ICD-10-CM | POA: Diagnosis not present

## 2022-05-24 DIAGNOSIS — R197 Diarrhea, unspecified: Secondary | ICD-10-CM | POA: Diagnosis not present

## 2022-05-24 DIAGNOSIS — R1084 Generalized abdominal pain: Secondary | ICD-10-CM

## 2022-05-24 DIAGNOSIS — K5190 Ulcerative colitis, unspecified, without complications: Secondary | ICD-10-CM

## 2022-05-24 MED ORDER — SODIUM CHLORIDE 0.9 % IV SOLN: 500.0000 mL | INTRAVENOUS | Status: DC

## 2022-05-24 MED ORDER — DICYCLOMINE HCL 20 MG PO TABS: 20.0000 mg | ORAL_TABLET | Freq: Four times a day (QID) | ORAL | 2 refills | Status: DC | PRN

## 2022-05-24 NOTE — Op Note (Signed)
[place] Patient Name: Kim Walsh Procedure Date: 05/24/2022 11:35 AM MRN: [id number] Endoscopist: Jerene Bears , MD, [id number] Age: 37 Referring MD:  Date of Birth: 1985/08/15 Gender: Female Account #: [id number] Procedure:                Colonoscopy Indications:              Epigastric abdominal pain, Clinically significant                            nausea, vomiting and diarrhea of unexplained                            origin; unremarkable EGD and CT abd/pelvis, ANA +                            1:1280 (rheum consult pending) Medicines:                Monitored Anesthesia Care Procedure:                Pre-Anesthesia Assessment:                           - Prior to the procedure, a History and Physical                            was performed, and patient medications and                            allergies were reviewed. The patient's tolerance of                            previous anesthesia was also reviewed. The risks                            and benefits of the procedure and the sedation                            options and risks were discussed with the patient.                            All questions were answered, and informed consent                            was obtained. Prior Anticoagulants: The patient has                            taken no anticoagulant or antiplatelet agents. ASA                            Grade Assessment: I - A normal, healthy patient.                            After reviewing the risks and benefits, the patient  was deemed in satisfactory condition to undergo the                            procedure.                           After obtaining informed consent, the colonoscope                            was passed under direct vision. Throughout the                            procedure, the patient's blood pressure, pulse, and                            oxygen saturations were monitored  continuously. The                            Olympus CF-HQ190L [id number]) Colonoscope was                            introduced through the anus and advanced to the                            terminal ileum. The colonoscopy was performed                            without difficulty. The patient tolerated the                            procedure well. The quality of the bowel                            preparation was excellent. The terminal ileum,                            ileocecal valve, appendiceal orifice, and rectum                            were photographed. Scope In: 11:47:43 AM Scope Out: 12:01:46 PM Scope Withdrawal Time: 0 hours 9 minutes 22 seconds  Total Procedure Duration: 0 hours 14 minutes 3 seconds  Findings:                 The digital rectal exam was normal.                           Patchy mild inflammation characterized by erosions                            and shallow ulcerations was found in the terminal                            ileum. Biopsies were taken with a cold forceps for  histology. The ulcerations were confined to the                            immediate terminal ileum, the more proximal ileal                            mucosa was normal.                           The entire examined colon appeared normal on direct                            and retroflexion views. Complications:            No immediate complications. Estimated Blood Loss:     Estimated blood loss was minimal. Impression:               - Mild inflammation was found in the very distal                            terminal ileum, rule out inflammatory bowel                            disease. Biopsied.                           - The entire examined colon is normal on direct and                            retroflexion views. Recommendation:           - Patient has a contact number available for                            emergencies. The signs and symptoms of  potential                            delayed complications were discussed with the                            patient. Return to normal activities tomorrow.                            Written discharge instructions were provided to the                            patient.                           - Resume previous diet.                           - Continue present medications.                           - Await pathology results.                           -  Referral to Dr. Benjamine Mola with rheumatology for + ANA                            (1:1280) and unexplained GI symptoms                           - Repeat colonoscopy in 10 years for screening                            purposes. Jerene Bears, MD 05/24/2022 12:08:13 PM This report has been signed electronically.

## 2022-05-24 NOTE — Progress Notes (Signed)
See office note dated 05/18/2022 for details and current H&P  Patient presenting for colonoscopy to evaluate episodic abdominal pain and diarrhea Recent EGD and CT scan of the abdomen pelvis unremarkable  She remains appropriate for colonoscopy here today.

## 2022-05-24 NOTE — Patient Instructions (Signed)
  Thank you for letting us take care of your healthcare needs today. Await referral for Dr. Benjamine Mola.   YOU HAD AN ENDOSCOPIC PROCEDURE TODAY AT [facility]:   Refer to the procedure report that was given to you for any specific questions about what was found during the examination.  If the procedure report does not answer your questions, please call your gastroenterologist to clarify.  If you requested that your care partner not be given the details of your procedure findings, then the procedure report has been included in a sealed envelope for you to review at your convenience later.  YOU SHOULD EXPECT: Some feelings of bloating in the abdomen. Passage of more gas than usual.  Walking can help get rid of the air that was put into your GI tract during the procedure and reduce the bloating. If you had a lower endoscopy (such as a colonoscopy or flexible sigmoidoscopy) you may notice spotting of blood in your stool or on the toilet paper. If you underwent a bowel prep for your procedure, you may not have a normal bowel movement for a few days.  Please Note:  You might notice some irritation and congestion in your nose or some drainage.  This is from the oxygen used during your procedure.  There is no need for concern and it should clear up in a day or so.  SYMPTOMS TO REPORT IMMEDIATELY:  Following lower endoscopy (colonoscopy or flexible sigmoidoscopy):  Excessive amounts of blood in the stool  Significant tenderness or worsening of abdominal pains  Swelling of the abdomen that is new, acute  Fever of 100F or higher    For urgent or emergent issues, a gastroenterologist can be reached at any hour by calling [phone]. Do not use MyChart messaging for urgent concerns.    DIET:  We do recommend a small meal at first, but then you may proceed to your regular diet.  Drink plenty of fluids but you should avoid alcoholic beverages for 24 hours.  ACTIVITY:  You should plan to  take it easy for the rest of today and you should NOT DRIVE or use heavy machinery until tomorrow (because of the sedation medicines used during the test).    FOLLOW UP: Our staff will call the number listed on your records the next business day following your procedure.  We will call around 7:15- 8:00 am to check on you and address any questions or concerns that you may have regarding the information given to you following your procedure. If we do not reach you, we will leave a message.     If any biopsies were taken you will be contacted by phone or by letter within the next 1-3 weeks.  Please call us at [phone] if you have not heard about the biopsies in 3 weeks.    SIGNATURES/CONFIDENTIALITY: You and/or your care partner have signed paperwork which will be entered into your electronic medical record.  These signatures attest to the fact that that the information above on your After Visit Summary has been reviewed and is understood.  Full responsibility of the confidentiality of this discharge information lies with you and/or your care-partner.

## 2022-05-24 NOTE — Telephone Encounter (Signed)
Pt is scheduled to see Dr. Estanislado Pandy with Rheum 05/28/22 at 10:20am. Left message for pt to call back regarding appt.

## 2022-05-24 NOTE — Progress Notes (Signed)
Pt's states no medical or surgical changes since previsit or office visit. 

## 2022-05-24 NOTE — Progress Notes (Signed)
Report to pacu rn. Vss. Care resumed by rn. 

## 2022-05-24 NOTE — Progress Notes (Signed)
Refill for Bentyl sent to patients pharmacy per Dr. Hilarie Fredrickson.

## 2022-05-24 NOTE — Progress Notes (Signed)
Called to room to assist during endoscopic procedure.  Patient ID and intended procedure confirmed with present staff. Received instructions for my participation in the procedure from the performing physician.  

## 2022-05-25 NOTE — Telephone Encounter (Signed)
Left message on answering machine. 

## 2022-05-28 ENCOUNTER — Encounter: Payer: Self-pay | Admitting: Rheumatology

## 2022-05-28 ENCOUNTER — Ambulatory Visit: Payer: 59 | Attending: Rheumatology | Admitting: Rheumatology

## 2022-05-28 ENCOUNTER — Other Ambulatory Visit: Payer: Self-pay

## 2022-05-28 VITALS — BP 116/68 | HR 81 | Resp 16 | Ht 64.5 in | Wt 169.0 lb

## 2022-05-28 DIAGNOSIS — D573 Sickle-cell trait: Secondary | ICD-10-CM

## 2022-05-28 DIAGNOSIS — M722 Plantar fascial fibromatosis: Secondary | ICD-10-CM | POA: Diagnosis not present

## 2022-05-28 DIAGNOSIS — R5383 Other fatigue: Secondary | ICD-10-CM

## 2022-05-28 DIAGNOSIS — R7989 Other specified abnormal findings of blood chemistry: Secondary | ICD-10-CM

## 2022-05-28 DIAGNOSIS — M546 Pain in thoracic spine: Secondary | ICD-10-CM | POA: Diagnosis not present

## 2022-05-28 DIAGNOSIS — M25532 Pain in left wrist: Secondary | ICD-10-CM

## 2022-05-28 DIAGNOSIS — R768 Other specified abnormal immunological findings in serum: Secondary | ICD-10-CM

## 2022-05-28 DIAGNOSIS — K529 Noninfective gastroenteritis and colitis, unspecified: Secondary | ICD-10-CM

## 2022-05-28 DIAGNOSIS — L308 Other specified dermatitis: Secondary | ICD-10-CM

## 2022-05-28 DIAGNOSIS — Z8669 Personal history of other diseases of the nervous system and sense organs: Secondary | ICD-10-CM

## 2022-05-28 DIAGNOSIS — M791 Myalgia, unspecified site: Secondary | ICD-10-CM

## 2022-05-28 DIAGNOSIS — N289 Disorder of kidney and ureter, unspecified: Secondary | ICD-10-CM

## 2022-05-28 DIAGNOSIS — Z8261 Family history of arthritis: Secondary | ICD-10-CM

## 2022-05-28 DIAGNOSIS — B009 Herpesviral infection, unspecified: Secondary | ICD-10-CM

## 2022-05-28 MED ORDER — BUDESONIDE 3 MG PO CPEP: 9.0000 mg | ORAL_CAPSULE | Freq: Every day | ORAL | 3 refills | Status: DC

## 2022-05-31 NOTE — Progress Notes (Signed)
I will discuss results at the fu visit.

## 2022-06-02 LAB — CBC WITH DIFFERENTIAL/PLATELET
Absolute Monocytes: 342 cells/uL (ref 200–950)
Basophils Absolute: 29 cells/uL (ref 0–200)
Basophils Relative: 0.5 %
Eosinophils Absolute: 91 cells/uL (ref 15–500)
Eosinophils Relative: 1.6 %
HCT: 35.4 % (ref 35.0–45.0)
Hemoglobin: 11.9 g/dL (ref 11.7–15.5)
Lymphs Abs: 1533 cells/uL (ref 850–3900)
MCH: 29.2 pg (ref 27.0–33.0)
MCHC: 33.6 g/dL (ref 32.0–36.0)
MCV: 87 fL (ref 80.0–100.0)
MPV: 10.4 fL (ref 7.5–12.5)
Monocytes Relative: 6 %
Neutro Abs: 3705 cells/uL (ref 1500–7800)
Neutrophils Relative %: 65 %
Platelets: 360 10*3/uL (ref 140–400)
RBC: 4.07 10*6/uL (ref 3.80–5.10)
RDW: 13.2 % (ref 11.0–15.0)
Total Lymphocyte: 26.9 %
WBC: 5.7 10*3/uL (ref 3.8–10.8)

## 2022-06-02 LAB — ANTI-SCLERODERMA ANTIBODY: Scleroderma (Scl-70) (ENA) Antibody, IgG: <1 AI

## 2022-06-02 LAB — COMPLETE METABOLIC PANEL WITH GFR
AG Ratio: 1.8 (calc) (ref 1.0–2.5)
ALT: 11 U/L (ref 6–29)
AST: 13 U/L (ref 10–30)
Albumin: 4.7 g/dL (ref 3.6–5.1)
Alkaline phosphatase (APISO): 61 U/L (ref 31–125)
BUN: 13 mg/dL (ref 7–25)
CO2: 27 mmol/L (ref 20–32)
Calcium: 9.7 mg/dL (ref 8.6–10.2)
Chloride: 106 mmol/L (ref 98–110)
Creat: 0.85 mg/dL (ref 0.50–0.97)
Globulin: 2.6 g/dL (calc) (ref 1.9–3.7)
Glucose, Bld: 113 mg/dL — ABNORMAL HIGH (ref 65–99)
Potassium: 3.8 mmol/L (ref 3.5–5.3)
Sodium: 142 mmol/L (ref 135–146)
Total Bilirubin: 0.7 mg/dL (ref 0.2–1.2)
Total Protein: 7.3 g/dL (ref 6.1–8.1)
eGFR: 92 mL/min/{1.73_m2} (ref >=60)

## 2022-06-02 LAB — BETA-2 GLYCOPROTEIN ANTIBODIES
Beta-2 Glyco 1 IgA: <2 U/mL (ref <20.0)
Beta-2 Glyco 1 IgM: <2 U/mL (ref <20.0)
Beta-2 Glyco I IgG: <2 U/mL (ref <20.0)

## 2022-06-02 LAB — LUPUS ANTICOAGULANT EVAL W/ REFLEX
PTT-LA Screen: 33 s (ref <=40)
dRVVT: 36 s (ref <=45)

## 2022-06-02 LAB — GLUCOSE 6 PHOSPHATE DEHYDROGENASE: G-6PDH: 16.6 U/g Hgb (ref 7.0–20.5)

## 2022-06-02 LAB — SJOGRENS SYNDROME-A EXTRACTABLE NUCLEAR ANTIBODY: SSA (Ro) (ENA) Antibody, IgG: <1 AI

## 2022-06-02 LAB — CARDIOLIPIN ANTIBODIES, IGG, IGM, IGA
Anticardiolipin IgA: <2 APL-U/mL (ref <20.0)
Anticardiolipin IgG: <2 GPL-U/mL (ref <20.0)
Anticardiolipin IgM: <2 MPL-U/mL (ref <20.0)

## 2022-06-02 LAB — ANTI-NUCLEAR AB-TITER (ANA TITER): ANA Titer 1: 1:1280 {titer} — ABNORMAL HIGH

## 2022-06-02 LAB — ANA: Anti Nuclear Antibody (ANA): POSITIVE — AB

## 2022-06-02 LAB — ANTI-SMITH ANTIBODY: ENA SM Ab Ser-aCnc: <1 AI

## 2022-06-02 LAB — SJOGRENS SYNDROME-B EXTRACTABLE NUCLEAR ANTIBODY: SSB (La) (ENA) Antibody, IgG: <1 AI

## 2022-06-02 LAB — SEDIMENTATION RATE: Sed Rate: 17 mm/h (ref 0–20)

## 2022-06-02 LAB — TSH: TSH: 1.23 mIU/L

## 2022-06-02 LAB — RNP ANTIBODY: Ribonucleic Protein(ENA) Antibody, IgG: <1 AI

## 2022-06-02 LAB — C3 AND C4
C3 Complement: 142 mg/dL (ref 83–193)
C4 Complement: 46 mg/dL (ref 15–57)

## 2022-06-02 LAB — ANTI-DNA ANTIBODY, DOUBLE-STRANDED: ds DNA Ab: <1 IU/mL

## 2022-06-02 LAB — THYROGLOBULIN ANTIBODY: Thyroglobulin Ab: 3 IU/mL — ABNORMAL HIGH (ref <=1)

## 2022-06-02 LAB — CK: Total CK: 130 U/L (ref 29–143)

## 2022-06-02 LAB — THYROID PEROXIDASE ANTIBODIES (TPO) (REFL): Thyroperoxidase Ab SerPl-aCnc: 177 IU/mL — ABNORMAL HIGH (ref <9)

## 2022-06-03 NOTE — Progress Notes (Signed)
Office Visit Note  Patient: Kim Walsh             Date of Birth: 12-Aug-1985           MRN: [id number]             PCP: [name], MD Referring: Tower, [name], MD Visit Date: 06/17/2022 Occupation: '@GUAROCC'$ @  Subjective:  Fatigue  History of Present Illness: Kim Walsh is a 37 y.o. female with history of fatigue and myalgias, returns for follow-up visit today.  She states she continues to have some generalized achiness and fatigue.  She had no recurrence of Planter fasciitis.  She also has intermittent thoracic pain.  She has been under a lot of stress due to loss of family members and having a small child.  She is doing better on Entocort 9 mg p.o. daily as regards to her GI symptoms.  She denies any history of oral ulcers, nasal ulcers, malar rash, photosensitivity, Raynaud's, lymphadenopathy or inflammatory arthritis.    Activities of Daily Living:  Patient reports morning stiffness for 0 minutes.   Patient Denies nocturnal pain.  Difficulty dressing/grooming: Denies Difficulty climbing stairs: Denies Difficulty getting out of chair: Denies Difficulty using hands for taps, buttons, cutlery, and/or writing: Denies  Review of Systems  Constitutional:  Positive for fatigue.  HENT:  Negative for mouth sores and mouth dryness.   Eyes:  Negative for dryness.  Respiratory:  Negative for shortness of breath.   Cardiovascular:  Negative for chest pain and palpitations.  Gastrointestinal:  Positive for constipation. Negative for blood in stool and diarrhea.  Endocrine: Positive for increased urination.  Genitourinary:  Negative for involuntary urination.  Musculoskeletal:  Positive for muscle tenderness. Negative for joint pain, gait problem, joint pain, joint swelling, myalgias, muscle weakness, morning stiffness and myalgias.  Skin:  Positive for rash. Negative for color change, hair loss and sensitivity to sunlight.  Allergic/Immunologic: Negative for  susceptible to infections.  Neurological:  Negative for dizziness and headaches.  Hematological:  Negative for swollen glands.  Psychiatric/Behavioral:  Positive for sleep disturbance. Negative for depressed mood. The patient is nervous/anxious.     PMFS History:  Patient Active Problem List   Diagnosis Date Noted   Viral syndrome 01/19/2022   Left wrist pain 11/02/2021   Viral URI with cough 08/25/2021   Sore throat 08/21/2021   Cyst of skin 06/24/2021   Kidney lesion, native, right 05/07/2021   Abdominal pain 05/07/2021   Productive cough 05/07/2021   Elevated LFTs 05/07/2021   Grief reaction 05/07/2021   Acute cough 05/05/2021   Fibroid 11/28/2018   Heavy menses 11/24/2018   Lip swelling 11/24/2018   Lower thoracic back pain 01/11/2017   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   Plantar fasciitis, bilateral 03/13/2015   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   Routine general medical examination at a health care facility 03/06/2012   Atopic dermatitis 09/07/2010   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   Change in hearing 07/16/2010   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/02/2006   SICKLE-CELL TRAIT 08/02/2006   Eczema 08/02/2006   Migraine 08/02/2006   ANA POSITIVE 08/02/2006    Past Medical History:  Diagnosis Date   Congenitally short metatarsal    Bilateral    Eczema    Gastritis    Headache(784.0)    Positive ANA (antinuclear antibody)    Tonsillitis     Family History  Problem Relation Age of Onset   Breast cancer Maternal Grandmother    Breast cancer  Maternal Aunt    Cancer Maternal Uncle        Non Hodgkins/Colon   Past Surgical History:  Procedure Laterality Date   CHOLECYSTECTOMY     TONSILLECTOMY     Social History   Social History Narrative   Not on file   Immunization History  Administered Date(s) Administered   Hpv-Unspecified 11/06/2007, 11/18/2008, 02/20/2009, 12/12/2009   Influenza, Seasonal, Injecte, Preservative Fre 01/08/2016, 01/05/2017, 01/19/2018, 01/16/2019,  01/15/2020, 02/02/2021   Meningococcal polysaccharide vaccine (MPSV4) 02/20/2009   PFIZER(Purple Top)SARS-COV-2 Vaccination 04/26/2019, 05/16/2019   Td 11/18/2008     Objective: Vital Signs: BP 103/67 (BP Location: Left Arm, Patient Position: Sitting, Cuff Size: Normal)   Pulse 79   Resp 12   Ht 5' 4.5" (1.638 m)   Wt 168 lb (76.2 kg)   LMP 05/28/2022   BMI 28.39 kg/m    Physical Exam Vitals and nursing note reviewed.  Constitutional:      Appearance: She is well-developed.  HENT:     Head: Normocephalic and atraumatic.  Eyes:     Conjunctiva/sclera: Conjunctivae normal.  Cardiovascular:     Rate and Rhythm: Normal rate and regular rhythm.     Heart sounds: Normal heart sounds.  Pulmonary:     Effort: Pulmonary effort is normal.     Breath sounds: Normal breath sounds.  Abdominal:     General: Bowel sounds are normal.     Palpations: Abdomen is soft.  Musculoskeletal:     Cervical back: Normal range of motion.  Lymphadenopathy:     Cervical: No cervical adenopathy.  Skin:    General: Skin is warm and dry.     Capillary Refill: Capillary refill takes less than 2 seconds.  Neurological:     Mental Status: She is alert and oriented to person, place, and time.  Psychiatric:        Behavior: Behavior normal.      Musculoskeletal Exam: Cervical, thoracic and lumbar spine were in good range of motion.  Shoulder joints, elbow joints, wrist joints, MCPs PIPs and DIPs with good range of motion with no synovitis.  Hip joints and knee joints are in good range of motion without any warmth swelling or effusion.  There was no tenderness over ankles, MTPs or PIPs.  CDAI Exam: CDAI Score: -- Patient Global: --; Provider Global: -- Swollen: --; Tender: -- Joint Exam 06/17/2022   No joint exam has been documented for this visit   There is currently no information documented on the homunculus. Go to the Rheumatology activity and complete the homunculus joint  exam.  Investigation: No additional findings.  Imaging: No results found.  Recent Labs: Lab Results  Component Value Date   WBC 5.7 05/28/2022   HGB 11.9 05/28/2022   PLT 360 05/28/2022   NA 142 05/28/2022   K 3.8 05/28/2022   CL 106 05/28/2022   CO2 27 05/28/2022   GLUCOSE 113 (H) 05/28/2022   BUN 13 05/28/2022   CREATININE 0.85 05/28/2022   BILITOT 0.7 05/28/2022   ALKPHOS 59 10/23/2021   AST 13 05/28/2022   ALT 11 05/28/2022   PROT 7.3 05/28/2022   ALBUMIN 4.7 10/23/2021   CALCIUM 9.7 05/28/2022   GFRAA 97 11/28/2018     Speciality Comments: No specialty comments available.  Procedures:  No procedures performed Allergies: Shrimp [shellfish allergy], Pseudoephedrine, and Tramadol   Assessment / Plan:     Visit Diagnoses: Positive ANA (antinuclear antibody) -she is significantly positive ANA titer but ENA panel  was completely negative and complements were normal.  She has no clinical features of lupus.  She denies any history of oral ulcers, nasal ulcers, malar rash, photosensitivity, Raynaud's, lymphadenopathy or inflammatory arthritis.  We had a detailed discussion regarding the labs.  She has thyroglobulin TPO antibodies but do not have thyroid disease.  May 28, 2022 ANA 1: 1280NH, ENA (SCL 70, RNP, Smith, SSA, SSB, dsDNA) negative, C3-C4 normal, beta-2 GP 1 negative, anticardiolipin negative, lupus anticoagulant negative, ESR 17, CK130, TSH normal, thyroglobulin antibody positive, thyroid peroxidase antibody positive, G6PD normal.  I advised patient to contact me if she develops any new symptoms.    Plantar fasciitis, bilateral - In the past with no recurrence.  Lower thoracic back pain - Intermittent pain over the years.  Thoracic exercises were emphasized.  Myalgia -she gives history of generalized intermittent myalgias.  She had no muscular weakness or tenderness on the examination.  CK was normal.  Possibility of myofascial pain was discussed. Stretching  exercises and emphasized.  CK normal, TSH normal.  Other fatigue -she gives history of chronic fatigue.  She has been under a lot of stress.  Stress relieving techniques were discussed.  Good sleep hygiene was also emphasized.  CBC, CMP, CK, TSH normal.  Lab findings were discussed with the patient.  Ileitis - Colonoscopy showed mild distal terminal ileum and possible mild Crohn's disease.  Patient was given a prescription for Entocort 9 mg p.o. daily.  She has noticed improvement in the symptoms.  Family history of rheumatoid arthritis-mother  SICKLE-CELL TRAIT  History of migraine  Other eczema  Kidney lesion, native, right  Herpes simplex virus (HSV) infection  Orders: Orders Placed This Encounter  Procedures   Protein / creatinine ratio, urine   CBC with Differential/Platelet   COMPLETE METABOLIC PANEL WITH GFR   Anti-DNA antibody, double-stranded   C3 and C4   Sedimentation rate   ANA   No orders of the defined types were placed in this encounter.    Follow-Up Instructions: Return in about 1 year (around 06/17/2023) for Fatigue, +ANA.   Bo Merino, MD  Note - This record has been created using Editor, commissioning.  Chart creation errors have been sought, but may not always  have been located. Such creation errors do not reflect on  the standard of medical care.

## 2022-06-09 IMAGING — DX DG CHEST 2V
2 series · 2 of 2 positions shown · non-contrast
Comparison: CT abdomen pelvis 05/06/2021

CLINICAL DATA: right lower lobe opacity on CT      some Oxendine cough

EXAM:
CHEST - 2 VIEW

[chest pa]
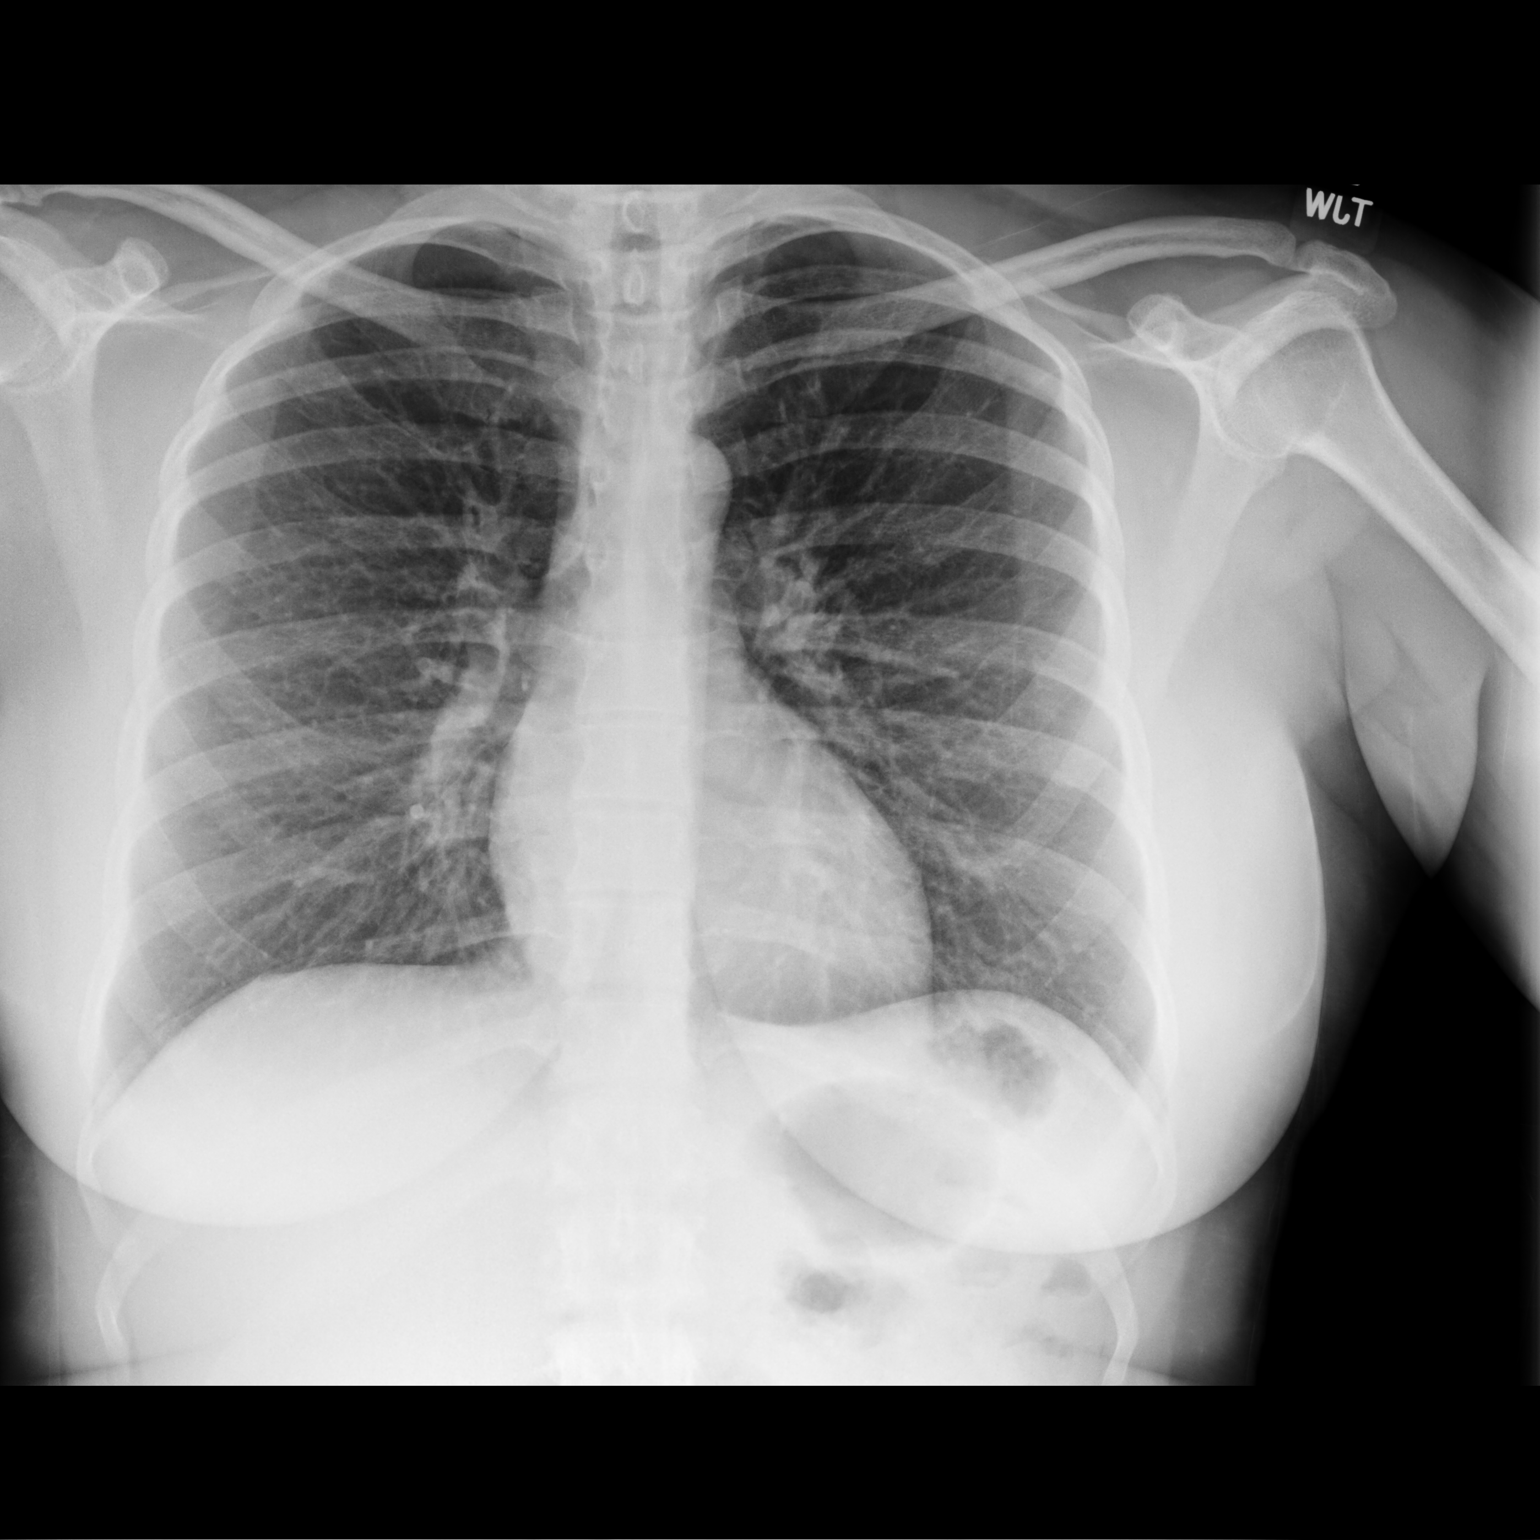

[chest lat]
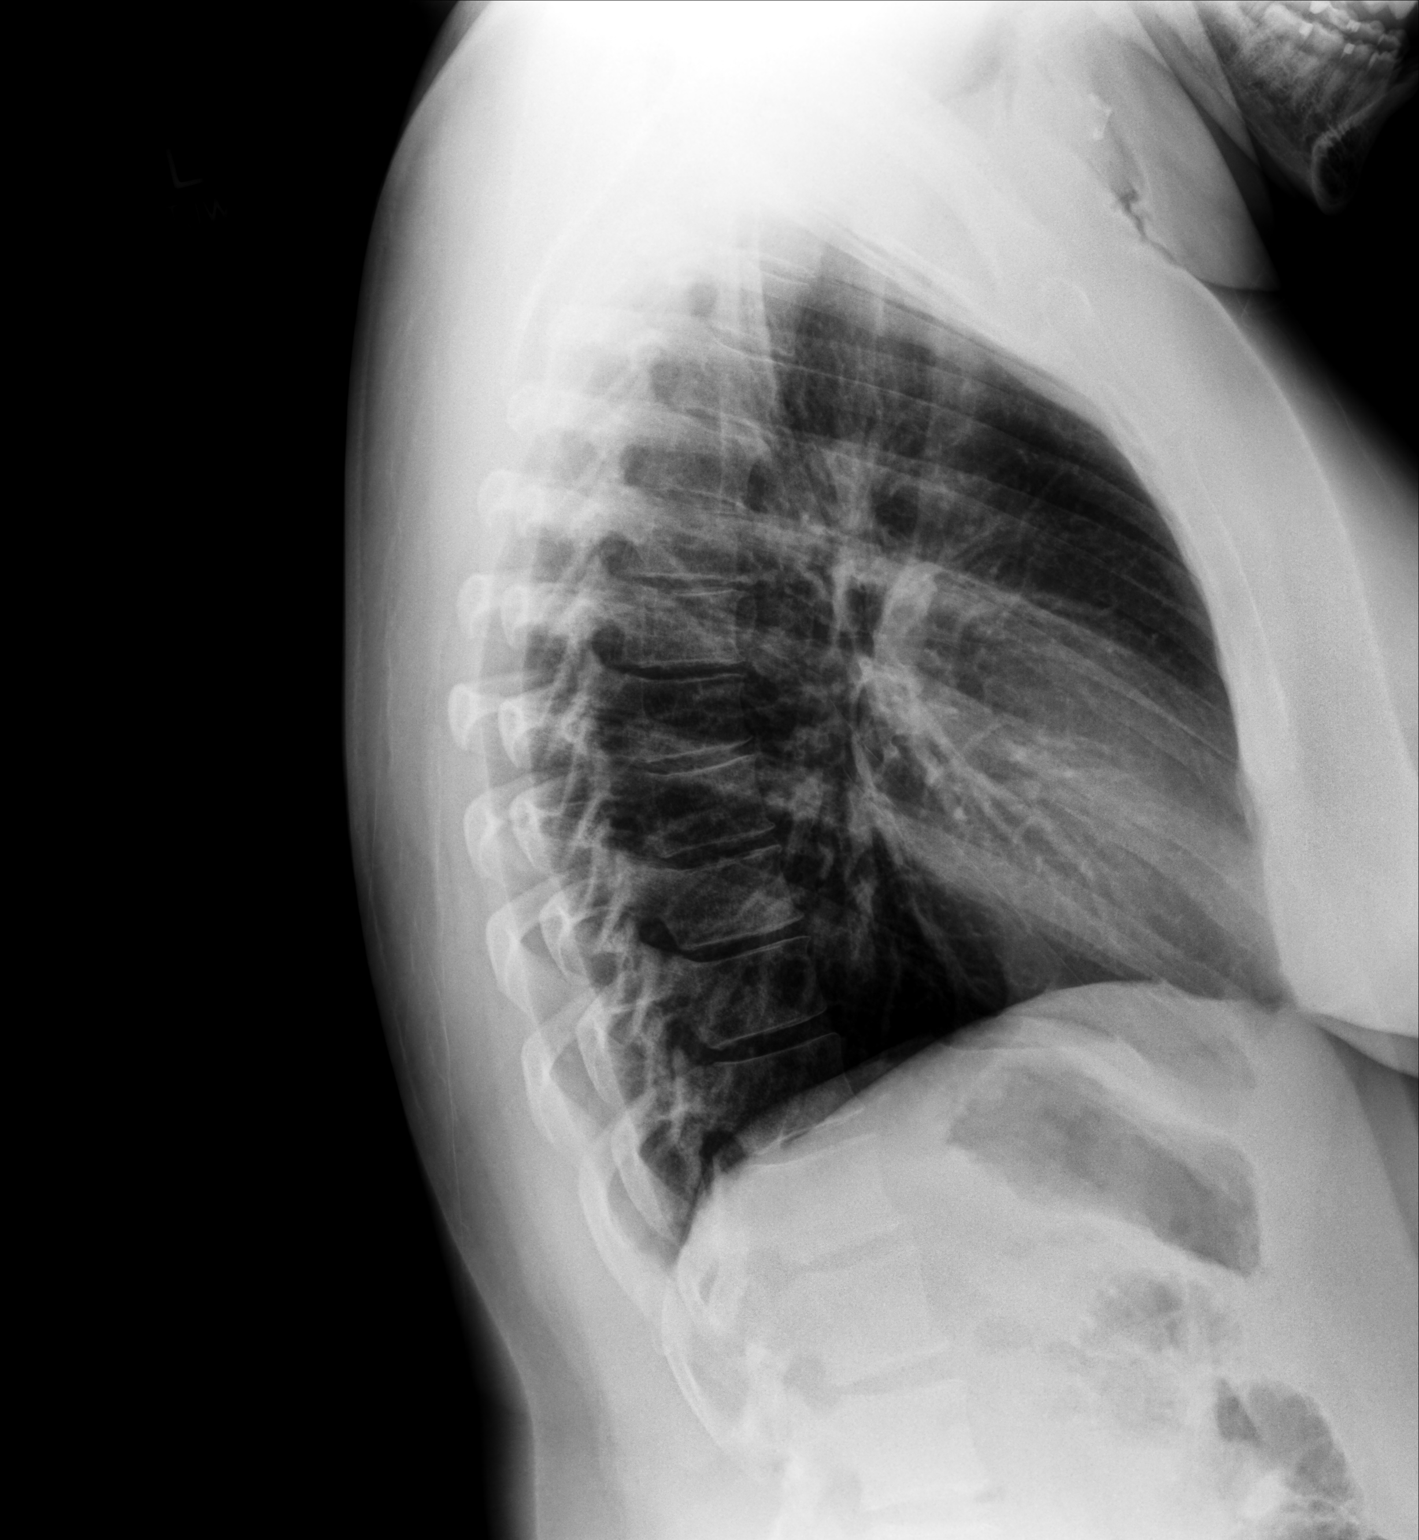

[2 of 2 positions shown; findings below may reference images not displayed]

FINDINGS: The cardiomediastinal silhouette is within normals. Right lower lobe
patchy opacities are best seen on recent CT. No other confluent
airspace disease visible radiographically. No pleural effusion. No
pneumothorax. No acute osseous abnormality.
IMPRESSION: Right lower lobe opacities are best seen on recent CT and concerning
for an infectious/inflammatory process. No other confluent airspace
disease visible radiographically.

## 2022-06-11 ENCOUNTER — Telehealth: Payer: Self-pay | Admitting: Family Medicine

## 2022-06-11 MED ORDER — ACYCLOVIR 200 MG PO CAPS: 400.0000 mg | ORAL_CAPSULE | Freq: Four times a day (QID) | ORAL | 0 refills | Status: DC | PRN

## 2022-06-11 NOTE — Telephone Encounter (Signed)
Zovirax last filled on 11/24/18 #30 caps/ 5 refills, pt saw Dr. Silvio Pate for an acute appt but no recent or future f/u or CPE with PCP and no future appts., please advise

## 2022-06-11 NOTE — Telephone Encounter (Signed)
Prescription Request  06/11/2022  LOV: 08/25/2021  What is the name of the medication or equipment? acyclovir (ZOVIRAX) 200 MG capsule [id number]   Have you contacted your pharmacy to request a refill? Yes   Which pharmacy would you like this sent to?  [id number]: [phone]: [phone]   Patient notified that their request is being sent to the clinical staff for review and that they should receive a response within 2 business days.   Please advise at Mobile [phone](mobile)  Told pt she hasn't been seen [facility] since 08/25/21, pt stated LSilvio Patewas suppose to refill meds when she saw him back 01/19/22

## 2022-06-11 NOTE — Telephone Encounter (Signed)
I refilled once    Schedule an annual exam this spring or early summer please

## 2022-06-14 NOTE — Telephone Encounter (Signed)
Called pt, told pt [facility]'s response regarding scheduling cpe. Pt hung up, sent mychart message

## 2022-06-14 NOTE — Telephone Encounter (Signed)
FYI to PCP (pt didn't schedule CPE she hung up on front desk)

## 2022-06-17 ENCOUNTER — Ambulatory Visit: Payer: 59 | Attending: Rheumatology | Admitting: Rheumatology

## 2022-06-17 ENCOUNTER — Encounter: Payer: Self-pay | Admitting: Rheumatology

## 2022-06-17 VITALS — BP 103/67 | HR 79 | Resp 12 | Ht 64.5 in | Wt 168.0 lb

## 2022-06-17 DIAGNOSIS — K529 Noninfective gastroenteritis and colitis, unspecified: Secondary | ICD-10-CM

## 2022-06-17 DIAGNOSIS — M546 Pain in thoracic spine: Secondary | ICD-10-CM | POA: Diagnosis not present

## 2022-06-17 DIAGNOSIS — M722 Plantar fascial fibromatosis: Secondary | ICD-10-CM

## 2022-06-17 DIAGNOSIS — R768 Other specified abnormal immunological findings in serum: Secondary | ICD-10-CM

## 2022-06-17 DIAGNOSIS — Z8669 Personal history of other diseases of the nervous system and sense organs: Secondary | ICD-10-CM

## 2022-06-17 DIAGNOSIS — M791 Myalgia, unspecified site: Secondary | ICD-10-CM

## 2022-06-17 DIAGNOSIS — Z8261 Family history of arthritis: Secondary | ICD-10-CM

## 2022-06-17 DIAGNOSIS — B009 Herpesviral infection, unspecified: Secondary | ICD-10-CM

## 2022-06-17 DIAGNOSIS — R5383 Other fatigue: Secondary | ICD-10-CM

## 2022-06-17 DIAGNOSIS — N289 Disorder of kidney and ureter, unspecified: Secondary | ICD-10-CM

## 2022-06-17 DIAGNOSIS — L308 Other specified dermatitis: Secondary | ICD-10-CM

## 2022-06-17 DIAGNOSIS — D573 Sickle-cell trait: Secondary | ICD-10-CM

## 2022-07-01 ENCOUNTER — Encounter: Payer: Self-pay | Admitting: *Deleted

## 2022-08-18 ENCOUNTER — Ambulatory Visit (INDEPENDENT_AMBULATORY_CARE_PROVIDER_SITE_OTHER): Payer: 59 | Admitting: Family Medicine

## 2022-08-18 ENCOUNTER — Encounter: Payer: Self-pay | Admitting: Family Medicine

## 2022-08-18 VITALS — BP 126/82 | HR 101 | Temp 97.7°F | Ht 64.5 in | Wt 166.4 lb

## 2022-08-18 DIAGNOSIS — J069 Acute upper respiratory infection, unspecified: Secondary | ICD-10-CM | POA: Diagnosis not present

## 2022-08-18 DIAGNOSIS — R768 Other specified abnormal immunological findings in serum: Secondary | ICD-10-CM | POA: Diagnosis not present

## 2022-08-18 DIAGNOSIS — H6122 Impacted cerumen, left ear: Secondary | ICD-10-CM

## 2022-08-18 DIAGNOSIS — K5080 Crohn's disease of both small and large intestine without complications: Secondary | ICD-10-CM | POA: Insufficient documentation

## 2022-08-18 DIAGNOSIS — K529 Noninfective gastroenteritis and colitis, unspecified: Secondary | ICD-10-CM | POA: Diagnosis not present

## 2022-08-18 DIAGNOSIS — H6120 Impacted cerumen, unspecified ear: Secondary | ICD-10-CM | POA: Insufficient documentation

## 2022-08-18 DIAGNOSIS — F430 Acute stress reaction: Secondary | ICD-10-CM

## 2022-08-18 MED ORDER — BUSPIRONE HCL 15 MG PO TABS: 7.5000 mg | ORAL_TABLET | Freq: Two times a day (BID) | ORAL | 3 refills | Status: AC

## 2022-08-18 NOTE — Assessment & Plan Note (Addendum)
Saw rheumatologist  Told her ENA was negative- ruled out lupus   Watching thyroid antibodies however

## 2022-08-18 NOTE — Assessment & Plan Note (Signed)
Noted on colonoscopy 05/2022  Presumed dx of chron's -not entirely sure Is taking entocort from GI  Overall doing better in terms of pain/ nausea

## 2022-08-18 NOTE — Patient Instructions (Addendum)
I think you have a viral upper respiratory infection   Drink fluids and rest  mucinex DM is good for cough and congestion  Nasal saline for congestion as needed  Flonase also   Tylenol for fever or pain or headache  Rest  Please alert [place] if symptoms worsen (if severe or short of breath please go to the ER)   Ear looks better after irrigation Hearing should continue to improve as the water dries out   Take a look at the info on buspar  Decide if you want to fill the px and let us know  If any intolerable side effects or if you feel worse stop it and let me know

## 2022-08-18 NOTE — Assessment & Plan Note (Signed)
With congestion and cough/ neg covid test as home Caught from her daughter  Reassuring exam  Discussed symptom care/see AVS ER precautions Will watch for wheezing / sob or fever as well  Update if not starting to improve in a week or if worsening

## 2022-08-18 NOTE — Progress Notes (Signed)
Subjective:    Patient ID: Kim Walsh, female    DOB: February 05, 1986, 37 y.o.   MRN: [id number]  HPI Pt presents with c/o cough, congestion and ear fullness  Cerumen impaction  Update on rheumatology status  Update on GI status/ colitis  Anxiety     Wt Readings from Last 3 Encounters:  08/18/22 166 lb 6 oz (75.5 kg)  06/17/22 168 lb (76.2 kg)  05/28/22 169 lb (76.7 kg)   28.12 kg/m  Did covid test at home-negative Ear fullness pre dated her uri symptoms   Off/on sick for a while  Friday - she noted a lot of trouble hearing out of L ear  Monday started to feel bad  Cannot smell well   Some congestion  No runny nose   Cough is productive/ clear mucous  Not wheezing Not tight  No ear pain   Headache in temples  Sore thoat -gargles with vinegar and water  No facial pain   No fever or chills    Daughter is sick with uri symptoms  No fever    Allergies - ? Seasonal   Flonase - uses when sick   Dimetapp   Saw rheumatology  Ruled out lupus /normal ENA (despite elevated ANA)  GI thinks she may have chron's and is on entocort currently   Is seeing a mental health therapist  This helps some Journaling  Walking and getting out in nature  Deep breathing exercises  Her therapist wonders if medication could help  Since her mother died she also found GF dead  Many family members have died   Symptoms include Difficulty focusing  Tearful at times  Occ sad but mostly anxious  Maybe a little irritable       Patient Active Problem List   Diagnosis Date Noted   Cerumen impaction 08/18/2022   Viral syndrome 01/19/2022   Left wrist pain 11/02/2021   Viral URI with cough 08/25/2021   Sore throat 08/21/2021   Cyst of skin 06/24/2021   Kidney lesion, native, right 05/07/2021   Abdominal pain 05/07/2021   Productive cough 05/07/2021   Elevated LFTs 05/07/2021   Grief reaction 05/07/2021   Acute cough 05/05/2021   Fibroid 11/28/2018   Heavy  menses 11/24/2018   Lip swelling 11/24/2018   Lower thoracic back pain 01/11/2017   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   Plantar fasciitis, bilateral 03/13/2015   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   Routine general medical examination at a health care facility 03/06/2012   Atopic dermatitis 09/07/2010   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   Change in hearing 07/16/2010   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/02/2006   SICKLE-CELL TRAIT 08/02/2006   Eczema 08/02/2006   Migraine 08/02/2006   ANA POSITIVE 08/02/2006   Past Medical History:  Diagnosis Date   Congenitally short metatarsal    Bilateral    Eczema    Elevated LFTs    Fatty liver    Gastritis    Headache(784.0)    Hiatal hernia    Positive ANA (antinuclear antibody)    Renal cyst, right    Tonsillitis    Past Surgical History:  Procedure Laterality Date   CHOLECYSTECTOMY     TONSILLECTOMY     Social History   Tobacco Use   Smoking status: Never    Passive exposure: Past   Smokeless tobacco: Never  Vaping Use   Vaping Use: Never used  Substance Use Topics   Alcohol use: Not Currently    Comment: occassionally wine  Drug use: No   Family History  Problem Relation Age of Onset   Arthritis/Rheumatoid Mother    Breast cancer Maternal Grandmother    Breast cancer Maternal Aunt    Cancer Maternal Uncle        Non Hodgkins/Colon   Allergies  Allergen Reactions   Shrimp [Shellfish Allergy] Swelling   Pseudoephedrine Itching and Rash    REACTION: face itched, rash   Tramadol Nausea Only    DIZZINESS AND FAINT FEELING   Current Outpatient Medications on File Prior to Visit  Medication Sig Dispense Refill   acyclovir (ZOVIRAX) 200 MG capsule Take 2 capsules (400 mg total) by mouth 4 (four) times daily as needed (for lip swelling). 30 capsule 0   albuterol (VENTOLIN HFA) 108 (90 Base) MCG/ACT inhaler Inhale 2 puffs into the lungs every 6 (six) hours as needed for wheezing or shortness of breath. 8 g 3   budesonide (ENTOCORT  EC) 3 MG 24 hr capsule Take 3 capsules (9 mg total) by mouth daily. 90 capsule 3   dicyclomine (BENTYL) 20 MG tablet Take 1 tablet (20 mg total) by mouth every 6 (six) hours as needed (Abdominal cramping). 20 tablet 2   mometasone (ELOCON) 0.1 % cream Apply topically daily. Apply to affected area for eczema once daily as needed - small amount. 45 g 3   omeprazole (PRILOSEC) 40 MG capsule Take 1 capsule (40 mg total) by mouth daily. 90 capsule 1   ondansetron (ZOFRAN) 4 MG tablet Take 1 tablet (4 mg total) by mouth every 8 (eight) hours as needed for nausea. 30 tablet 3   SUMAtriptan (IMITREX) 100 MG tablet Take 1 tablet (100 mg total) by mouth every 2 (two) hours as needed for migraine. May repeat in 2 hours if headache persists or recurs. 10 tablet 11   cholecalciferol (VITAMIN D) 25 MCG (1000 UT) tablet Take 2,000 Units by mouth daily. (Patient not taking: Reported on 08/18/2022)     No current facility-administered medications on file prior to visit.    Review of Systems  Constitutional:  Positive for appetite change and fatigue. Negative for fever.  HENT:  Positive for congestion, postnasal drip, sneezing, sore throat and voice change. Negative for ear pain, rhinorrhea, sinus pressure and sinus pain.   Eyes:  Negative for pain and discharge.  Respiratory:  Positive for cough. Negative for shortness of breath, wheezing and stridor.   Cardiovascular:  Negative for chest pain.  Gastrointestinal:  Negative for diarrhea, nausea and vomiting.  Genitourinary:  Negative for frequency, hematuria and urgency.  Musculoskeletal:  Negative for arthralgias and myalgias.  Skin:  Negative for rash.  Neurological:  Positive for headaches. Negative for dizziness, weakness and light-headedness.  Psychiatric/Behavioral:  Negative for confusion and dysphoric mood.        Objective:   Physical Exam Constitutional:      General: She is not in acute distress.    Appearance: She is well-developed and normal  weight. She is not ill-appearing or diaphoretic.  HENT:     Head: Normocephalic and atraumatic.     Comments: No sinus tenderness     Right Ear: Tympanic membrane and ear canal normal. There is no impacted cerumen.     Left Ear: There is impacted cerumen.     Ears:     Comments: Procedure: Cerumen Disimpaction of L ear  After consent obtained  Warm water was applied and gentle ear lavage performed on   ear.    There were no  complications and following the disimpaction the tympanic membrane were visible on the bilateral. Tympanic membranes are intact following the procedure.  Auditory canals are normal.  The patient reported relief of symptoms after removal of cerumen.  TMs clear       Nose: Congestion present.     Mouth/Throat:     Mouth: Mucous membranes are moist.     Pharynx: Oropharynx is clear. No oropharyngeal exudate or posterior oropharyngeal erythema.     Comments: Clear pnd  Eyes:     General:        Right eye: No discharge.        Left eye: No discharge.     Conjunctiva/sclera: Conjunctivae normal.     Pupils: Pupils are equal, round, and reactive to light.  Cardiovascular:     Rate and Rhythm: Regular rhythm. Tachycardia present.     Heart sounds: Normal heart sounds.  Pulmonary:     Effort: No respiratory distress.     Breath sounds: No wheezing.     Comments: Harsh bs Occ scattered rhonchi No wheeze even on forced expiraton  Musculoskeletal:     Cervical back: Normal range of motion and neck supple.  Lymphadenopathy:     Cervical: No cervical adenopathy.  Skin:    General: Skin is warm and dry.     Coloration: Skin is not pale.     Findings: No erythema or rash.  Neurological:     Mental Status: She is alert.     Cranial Nerves: No cranial nerve deficit.     Motor: No weakness.  Psychiatric:        Attention and Perception: Attention normal.        Mood and Affect: Mood is anxious. Affect is tearful.        Speech: Speech normal.        Behavior:  Behavior normal.        Cognition and Memory: Cognition and memory normal.           Assessment & Plan:   Problem List Items Addressed This Visit       Respiratory   Viral URI with cough - Primary    With congestion and cough/ neg covid test as home Caught from her daughter  Reassuring exam  Discussed symptom care/see AVS ER precautions Will watch for wheezing / sob or fever as well  Update if not starting to improve in a week or if worsening          Digestive   Ileitis    Noted on colonoscopy 05/2022  Presumed dx of chron's -not entirely sure Is taking entocort from GI  Overall doing better in terms of pain/ nausea         Nervous and Auditory   Cerumen impaction    L sided causing hearing loss   This was irrigated today with success and improved hearing  Instructed to call if ear pain or other changes occur          Other   ANA positive    Saw rheumatologist  Told her ENA was negative- ruled out lupus   Watching thyroid antibodies however       Stress reaction    Discussed recent loss of mother and GF and other family members Also stress of own health problems  Seeing therapist and practicing good self care Reviewed stressors/ coping techniques/symptoms/ support sources/ tx options and side effects in detail today  Discussed opt of medication / pros and  cons Given px for buspar 7.5 mg bid and also info on it  She will decide whether to try Discussed expectations of this medication including time to effectiveness and mechanism of action, also poss of side effects (early and late)- including mental fuzziness, weight or appetite change, nausea and poss of worse dep or anxiety (even suicidal thoughts)  Pt voiced understanding and will stop med and update if this occurs   Encouraged to update [place] and continue good self care       Relevant Medications   busPIRone (BUSPAR) 15 MG tablet

## 2022-08-18 NOTE — Assessment & Plan Note (Signed)
L sided causing hearing loss   This was irrigated today with success and improved hearing  Instructed to call if ear pain or other changes occur

## 2022-08-18 NOTE — Assessment & Plan Note (Signed)
Discussed recent loss of mother and GF and other family members Also stress of own health problems  Seeing therapist and practicing good self care Reviewed stressors/ coping techniques/symptoms/ support sources/ tx options and side effects in detail today  Discussed opt of medication / pros and cons Given px for buspar 7.5 mg bid and also info on it  She will decide whether to try Discussed expectations of this medication including time to effectiveness and mechanism of action, also poss of side effects (early and late)- including mental fuzziness, weight or appetite change, nausea and poss of worse dep or anxiety (even suicidal thoughts)  Pt voiced understanding and will stop med and update if this occurs   Encouraged to update [place] and continue good self care

## 2022-08-24 ENCOUNTER — Ambulatory Visit: Payer: 59 | Admitting: Internal Medicine

## 2022-08-24 ENCOUNTER — Ambulatory Visit (INDEPENDENT_AMBULATORY_CARE_PROVIDER_SITE_OTHER): Payer: 59 | Admitting: Internal Medicine

## 2022-08-24 ENCOUNTER — Encounter: Payer: Self-pay | Admitting: Internal Medicine

## 2022-08-24 VITALS — BP 122/70 | HR 67 | Ht 64.0 in | Wt 170.0 lb

## 2022-08-24 DIAGNOSIS — R1084 Generalized abdominal pain: Secondary | ICD-10-CM

## 2022-08-24 DIAGNOSIS — R197 Diarrhea, unspecified: Secondary | ICD-10-CM | POA: Diagnosis not present

## 2022-08-24 DIAGNOSIS — K5000 Crohn's disease of small intestine without complications: Secondary | ICD-10-CM | POA: Diagnosis not present

## 2022-08-24 DIAGNOSIS — R112 Nausea with vomiting, unspecified: Secondary | ICD-10-CM

## 2022-08-24 MED ORDER — DICYCLOMINE HCL 20 MG PO TABS: 20.0000 mg | ORAL_TABLET | Freq: Four times a day (QID) | ORAL | 2 refills | Status: DC | PRN

## 2022-08-24 MED ORDER — ONDANSETRON HCL 4 MG PO TABS: 4.0000 mg | ORAL_TABLET | Freq: Three times a day (TID) | ORAL | 2 refills | Status: DC | PRN

## 2022-08-24 NOTE — Patient Instructions (Addendum)
Please discontinue Entocort. Let's see if your nausea, vomiting and abdominal pain get any better off of it. _______________________________________________________  Continue zofran and dicyclomine as needed. _______________________________________________________  Continue omeprazole. _______________________________________________________  Please follow up with Dr Rhea Belton on 12/20/22 at 3:20 pm. _______________________________________________________  If your blood pressure at your visit was 140/90 or greater, please contact your primary care physician to follow up on this.  _______________________________________________________  If you are age 33 or older, your body mass index should be between 23-30. Your Body mass index is 29.18 kg/m. If this is out of the aforementioned range listed, please consider follow up with your Primary Care Provider.  If you are age 14 or younger, your body mass index should be between 19-25. Your Body mass index is 29.18 kg/m. If this is out of the aformentioned range listed, please consider follow up with your Primary Care Provider.   ________________________________________________________  The [place] [facility] providers would like to encourage you to use [facility] to communicate with providers for non-urgent requests or questions.  Due to long hold times on the telephone, sending your provider a message by [facility] may be a faster and more efficient way to get a response.  Please allow 48 business hours for a response.  Please remember that this is for non-urgent requests.  _______________________________________________________  Due to recent changes in healthcare laws, you may see the results of your imaging and laboratory studies on MyChart before your provider has had a chance to review them.  We understand that in some cases there may be results that are confusing or concerning to you. Not all laboratory results come back in the same time frame and the  provider may be waiting for multiple results in order to interpret others.  Please give [place] 48 hours in order for your provider to thoroughly review all the results before contacting the office for clarification of your results.

## 2022-08-24 NOTE — Progress Notes (Signed)
Subjective:    Patient ID: Kim Walsh, female    DOB: May 11, 1985, 37 y.o.   MRN: [id number]  HPI Kim Walsh is a 37 year old female with new diagnosis of ileal Crohn's disease, elevated ANA without definitive rheumatologic diagnosis, arthropathy, prior liver enzyme elevation, sickle cell trait who is here for follow-up.  She is here alone today and I last saw her for upper and lower endoscopy on 06/03/2022.  EGD revealed a normal esophagus, 3 cm hiatal hernia.  Normal stomach and duodenum.  Biopsies from the duodenum were normal.  Biopsies from the stomach were normal.  No H. pylori. Colonoscopy revealed mild inflammation with erosions and ulcerations in the terminal ileum.  This appeared to be confined to a short segment.  The colon was normal.  Pathology from the ileum showed mild active inflammation with a differential including NSAIDs and Crohn's disease.  After her colonoscopy I started her on Entocort 9 mg daily which she has been taking since that time and now about 12 weeks.  She feels definitively better.  Her diarrhea is better though she has had a couple of bouts of diarrhea since her colonoscopy.  This happened recently after eating a vegan chili.  She is having improved nausea and much less episodes of vomiting.  She does use occasional Bentyl and ondansetron.  She has had no blood in her stool.  She has been avoiding ibuprofen and trying to use Tylenol for her menstrual cramping.  When she was using ibuprofen this was confined to 1 or 2 days/month.  She was never using this on a frequent basis.  She saw Dr. Corliss Skains shortly after her colonoscopy to follow-up her positive ANA at 1:1280.  She had a positive thyroglobulin antibody and thyroid peroxidase antibody but the rest of her autoimmune workup was unrevealing.  There was no definitive rheumatologic diagnosis made at that time and it was not felt that she had clinical features of lupus.   Review of Systems As  per HPI, otherwise negative  Current Medications, Allergies, Past Medical History, Past Surgical History, Family History and Social History were reviewed in [employer] record.     Objective:   Physical Exam BP 122/70   Pulse 67   Ht 5\' 4"  (1.626 m)   Wt 170 lb (77.1 kg)   LMP 08/14/2022   SpO2 98%   BMI 29.18 kg/m  Gen: awake, alert, NAD HEENT: anicteric  Neuro: nonfocal     Latest Ref Rng & Units 05/28/2022   10:57 AM 10/23/2021   11:24 AM 09/19/2021   11:31 AM  CBC  WBC 3.8 - 10.8 Thousand/uL 5.7  5.5  7.7   Hemoglobin 11.7 - 15.5 g/dL 40.9  81.1  91.4   Hematocrit 35.0 - 45.0 % 35.4  35.5  39.3   Platelets 140 - 400 Thousand/uL 360  291.0  329    CMP     Component Value Date/Time   NA 142 05/28/2022 1057   NA 140 11/28/2018 1147   K 3.8 05/28/2022 1057   CL 106 05/28/2022 1057   CO2 27 05/28/2022 1057   GLUCOSE 113 (H) 05/28/2022 1057   BUN 13 05/28/2022 1057   BUN 11 11/28/2018 1147   CREATININE 0.85 05/28/2022 1057   CALCIUM 9.7 05/28/2022 1057   PROT 7.3 05/28/2022 1057   PROT 7.2 11/28/2018 1147   ALBUMIN 4.7 10/23/2021 1124   ALBUMIN 4.7 11/28/2018 1147   AST 13 05/28/2022 1057   ALT 11  05/28/2022 1057   ALKPHOS 59 10/23/2021 1124   BILITOT 0.7 05/28/2022 1057   BILITOT 0.4 11/28/2018 1147   GFRNONAA >60 09/19/2021 1131   GFRNONAA >89 11/07/2014 1551   GFRAA 97 11/28/2018 1147   GFRAA >89 11/07/2014 1551         Assessment & Plan:   37 year old female with new diagnosis of ileal Crohn's disease, elevated ANA without definitive rheumatologic diagnosis, arthropathy, prior liver enzyme elevation, sickle cell trait who is here for follow-up.   Ileal Crohn's disease --we discussed that her symptoms, response to Entocort and pathology results are consistent with ileal Crohn's disease.  This is felt to be short segment that we have not definitively imaged the more proximal small bowel with enterography or video capsule endoscopy.   Her diarrhea, abdominal pain and nausea are better with Entocort definitively.  We discussed Crohn's disease today the natural history.  She asks about diet and while there is no specific diet a diet that is low in processed foods is the healthiest.  She should also avoid NSAIDs.  Plan as follows for now: --Stop Entocort; we discussed how this is not a long-term maintenance therapy for ileal Crohn's disease.  If symptoms recur (abdominal pain, diarrhea, nausea or vomiting) then I would recommend that we begin new therapy with Cristy Folks being likely the best choice --She is asked to call me or MyChart message if symptoms recur or by July 1 for an update  2.  Positive ANA/arthropathy type symptoms --no definitive diagnosis.  This is likely clinically significant and she may need future rheumatology follow-up.  If we end up starting a biologic such as Cristy Folks we will see if her arthropathy symptoms improve as I hope they would  3.  Intermittent abdominal cramping and nausea --this may be directly related to #1 or even more irritable bowel type response --Bentyl 20 mg 3 times daily as needed --Ondansetron 4 mg every 8 hours as needed nausea or vomiting  4.  Elevated liver enzymes --resolved; monitor over time particularly in light of possible autoimmune conditions.  84-month follow-up with me, sooner if needed  30 minutes total spent today including patient facing time, coordination of care, reviewing medical history/procedures/pertinent radiology studies, and documentation of the encounter.

## 2022-09-06 ENCOUNTER — Telehealth: Payer: Self-pay | Admitting: Family Medicine

## 2022-09-06 MED ORDER — AMOXICILLIN-POT CLAVULANATE 875-125 MG PO TABS: 1.0000 | ORAL_TABLET | Freq: Two times a day (BID) | ORAL | 0 refills | Status: DC

## 2022-09-06 NOTE — Telephone Encounter (Signed)
Pt called in stated she still not felling well . Have cough , congestion , sore throat . Wants to know if she should keep appointment tomorrow 09/07/22 or can something be call in to pharmacy . Please advise [phone]

## 2022-09-06 NOTE — Telephone Encounter (Signed)
I sent in augmentin  Please f/u if not improving  Thanks for letting me know

## 2022-09-06 NOTE — Telephone Encounter (Signed)
Patient was seen on 08/18/22 for cough and congestion w/ neg covid test at home  Patient is still having a lot of post nasal drip, productive cough, sore throat, congestion, feels a lot of pressure in both ears. Patient has been taking Mucinex DM and flonase, with not much relief.   Denies fever, chills, body aches, sob, wheezing  Patient is requesting abx be sent in without having to come back for appt. Please advise.

## 2022-09-06 NOTE — Telephone Encounter (Signed)
Called patient and reviewed all information. Patient verbalized understanding. Will call if any further questions.  

## 2022-09-07 ENCOUNTER — Ambulatory Visit: Payer: 59 | Admitting: Family Medicine

## 2022-10-13 ENCOUNTER — Other Ambulatory Visit: Payer: Self-pay | Admitting: Family Medicine

## 2022-10-13 NOTE — Telephone Encounter (Signed)
Prescription Request  10/13/2022  LOV: 08/18/2022  What is the name of the medication or equipment?  acyclovir (ZOVIRAX) 200 MG capsule   Have you contacted your pharmacy to request a refill? No   Which pharmacy would you like this sent to?  [id number] [place], [place] - [address] [place] [address] Phone: [phone] Fax: [phone]    Patient notified that their request is being sent to the clinical staff for review and that they should receive a response within 2 business days.   Please advise at [facility] [phone]

## 2022-10-14 MED ORDER — ACYCLOVIR 200 MG PO CAPS: 400.0000 mg | ORAL_CAPSULE | Freq: Four times a day (QID) | ORAL | 1 refills | Status: AC | PRN

## 2022-10-14 NOTE — Telephone Encounter (Addendum)
Last filled on 06/11/22 #30 caps 0 refill  Last OV was an acute for URI on 08/18/22

## 2022-10-14 NOTE — Addendum Note (Signed)
Addended by: Shon Millet on: 10/14/2022 10:59 AM   Modules accepted: Orders

## 2022-10-14 NOTE — Addendum Note (Signed)
Addended by: Roxy Manns A on: 10/14/2022 11:11 AM   Modules accepted: Orders

## 2022-11-13 ENCOUNTER — Other Ambulatory Visit: Payer: Self-pay | Admitting: Physician Assistant

## 2022-11-13 DIAGNOSIS — R112 Nausea with vomiting, unspecified: Secondary | ICD-10-CM

## 2022-11-13 DIAGNOSIS — R1013 Epigastric pain: Secondary | ICD-10-CM

## 2022-11-17 ENCOUNTER — Other Ambulatory Visit: Payer: Self-pay | Admitting: Physician Assistant

## 2022-11-17 DIAGNOSIS — R112 Nausea with vomiting, unspecified: Secondary | ICD-10-CM

## 2022-11-17 DIAGNOSIS — R1013 Epigastric pain: Secondary | ICD-10-CM

## 2022-12-20 ENCOUNTER — Ambulatory Visit (INDEPENDENT_AMBULATORY_CARE_PROVIDER_SITE_OTHER): Payer: 59 | Admitting: Internal Medicine

## 2022-12-20 ENCOUNTER — Other Ambulatory Visit: Payer: 59

## 2022-12-20 ENCOUNTER — Encounter: Payer: Self-pay | Admitting: Internal Medicine

## 2022-12-20 VITALS — BP 110/70 | HR 69 | Ht 64.5 in | Wt 171.0 lb

## 2022-12-20 DIAGNOSIS — K5000 Crohn's disease of small intestine without complications: Secondary | ICD-10-CM

## 2022-12-20 DIAGNOSIS — R768 Other specified abnormal immunological findings in serum: Secondary | ICD-10-CM

## 2022-12-20 DIAGNOSIS — R197 Diarrhea, unspecified: Secondary | ICD-10-CM

## 2022-12-20 DIAGNOSIS — R112 Nausea with vomiting, unspecified: Secondary | ICD-10-CM | POA: Diagnosis not present

## 2022-12-20 MED ORDER — DICYCLOMINE HCL 20 MG PO TABS: 20.0000 mg | ORAL_TABLET | Freq: Four times a day (QID) | ORAL | 5 refills | Status: AC | PRN

## 2022-12-20 MED ORDER — ONDANSETRON HCL 4 MG PO TABS: 4.0000 mg | ORAL_TABLET | Freq: Three times a day (TID) | ORAL | 5 refills | Status: AC | PRN

## 2022-12-20 NOTE — Progress Notes (Signed)
Subjective:    Patient ID: Kim Walsh, female    DOB: 21-Jun-1985, 37 y.o.   MRN: [id number]  HPI Kim Walsh is a 38 year old female with a history of ileal Crohn's disease, elevated ANA without definitive rheumatologic diagnosis, arthropathy, prior liver enzyme elevation, sickle cell trait who is here for follow-up.  She is here with her 45-year-old daughter and I last saw her on 08/24/2022.  See EGD and colonoscopy from earlier this year.  At her last visit we decided to stop Entocort and monitor for any recurrent signs of active Crohn's disease including abdominal pain, diarrhea, nausea or vomiting.  She reports that she is certainly better than prior to her Entocort and it is "nowhere near as bad as it was" but she does have intermittent flares of looser stools which seem to come and go.  She has also had return of her nausea but no vomiting.  She is using Zofran with success typically only once per day and not necessarily every day.  Bentyl helps with her crampy abdominal pain.  She has not seen blood in her stool.  She also wonders about "dumping syndrome".  Review of Systems As per HPI, otherwise negative  Current Medications, Allergies, Past Medical History, Past Surgical History, Family History and Social History were reviewed in [employer] record.    Objective:   Physical Exam BP 110/70   Pulse 69   Ht 5' 4.5" (1.638 m)   Wt 171 lb (77.6 kg)   BMI 28.90 kg/m  Gen: awake, alert, NAD HEENT: anicteric  Ext: no c/c/e Neuro: nonfocal     Assessment & Plan:  37 year old female with a history of ileal Crohn's disease, elevated ANA without definitive rheumatologic diagnosis, arthropathy, prior liver enzyme elevation, sickle cell trait who is here for follow-up.  Ileal Crohn's disease/intermittent diarrhea with nausea --this may be recurrent Crohn's disease causing her symptoms particularly given improvement with budesonide and some  recurrence after budesonide was stopped.  She has been off of Entocort since her last visit with me in May.  We discussed this at length together today and I recommended the following: --Fecal calprotectin --CMP, QuantiFERON gold and hepatitis B serology --CT enterography --Bentyl 20 mg 3 times daily as needed; ondansetron 4 mg every 6-8 hours as needed for nausea --If elevated fecal calprotectin or positive disease by enterography then would recommend Skyrizi --If no objective Crohn's disease could consider rifaximin and/or the addition of a bile acid sequestrant given prior cholecystectomy  2.  Positive ANA/arthropathy type symptoms --no definitive diagnosis.  Likely clinically significant.  Has seen rheumatology in the past.  3.  History of elevated liver enzymes --liver enzymes have normalized.  Repeat today particular given autoimmune disease  January follow-up, sooner if needed  30 minutes total spent today including patient facing time, coordination of care, reviewing medical history/procedures/pertinent radiology studies, and documentation of the encounter.

## 2022-12-20 NOTE — Patient Instructions (Signed)
Your provider has requested that you go to the basement level for lab work before leaving today. Press "B" on the elevator. The lab is located at the first door on the left as you exit the elevator.  We have sent the following medications to your pharmacy for you to pick up at your convenience: bentyl and zofran.  You have been scheduled for a CT entero scan of the abdomen and pelvis at [facility], 1st floor Radiology. You are scheduled on 12/28/22 at 2:00pm. Please arrive at 12:30pm.  Please follow the written instructions below on the day of your exam:   1) Do not eat anything after 10:00 am (4 hours prior to your test)    You may take any medications as prescribed with a small amount of water, if necessary. If you take any of the following medications: METFORMIN, GLUCOPHAGE, GLUCOVANCE, AVANDAMET, RIOMET, FORTAMET, ACTOPLUS MET, JANUMET, GLUMETZA or METAGLIP, you MAY be asked to HOLD this medication 48 hours AFTER the exam.   The purpose of you drinking the oral contrast is to aid in the visualization of your intestinal tract. The contrast solution may cause some diarrhea. Depending on your individual set of symptoms, you may also receive an intravenous injection of x-ray contrast/dye. Plan on being at [facility] for 45 minutes or longer, depending on the type of exam you are having performed.   If you have any questions regarding your exam or if you need to reschedule, you may call [facility] at [phone] between the hours of 8:00 am and 5:00 pm, Monday-Friday.    _______________________________________________________  If your blood pressure at your visit was 140/90 or greater, please contact your primary care physician to follow up on this.  _______________________________________________________  If you are age 65 or older, your body mass index should be between 23-30. Your Body mass index is 28.9 kg/m. If this is out of the aforementioned range listed, please  consider follow up with your Primary Care Provider.  If you are age 69 or younger, your body mass index should be between 19-25. Your Body mass index is 28.9 kg/m. If this is out of the aformentioned range listed, please consider follow up with your Primary Care Provider.   ________________________________________________________  The [facility] providers would like to encourage you to use [facility] to communicate with providers for non-urgent requests or questions.  Due to long hold times on the telephone, sending your provider a message by [facility] may be a faster and more efficient way to get a response.  Please allow 48 business hours for a response.  Please remember that this is for non-urgent requests.  _______________________________________________________

## 2022-12-22 LAB — HEPATITIS B SURFACE ANTIGEN: Hepatitis B Surface Ag: NONREACTIVE

## 2022-12-22 LAB — HEPATITIS B SURFACE ANTIBODY,QUALITATIVE: Hep B S Ab: REACTIVE — AB

## 2022-12-28 ENCOUNTER — Ambulatory Visit (HOSPITAL_COMMUNITY): Payer: 59

## 2023-01-11 ENCOUNTER — Ambulatory Visit (INDEPENDENT_AMBULATORY_CARE_PROVIDER_SITE_OTHER)
Admission: RE | Admit: 2023-01-11 | Discharge: 2023-01-11 | Disposition: A | Discharge status: Home / Self Care | Payer: 59 | Source: Ambulatory Visit | Attending: Family Medicine | Admitting: Family Medicine

## 2023-01-11 ENCOUNTER — Ambulatory Visit (INDEPENDENT_AMBULATORY_CARE_PROVIDER_SITE_OTHER): Payer: 59 | Admitting: Family Medicine

## 2023-01-11 ENCOUNTER — Encounter: Payer: Self-pay | Admitting: Family Medicine

## 2023-01-11 VITALS — BP 92/58 | HR 68 | Temp 98.3°F | Ht 64.5 in | Wt 166.1 lb

## 2023-01-11 DIAGNOSIS — K219 Gastro-esophageal reflux disease without esophagitis: Secondary | ICD-10-CM | POA: Diagnosis not present

## 2023-01-11 DIAGNOSIS — R079 Chest pain, unspecified: Secondary | ICD-10-CM | POA: Diagnosis not present

## 2023-01-11 DIAGNOSIS — Z23 Encounter for immunization: Secondary | ICD-10-CM

## 2023-01-11 LAB — CBC WITH DIFFERENTIAL/PLATELET
Basophils Absolute: 0 10*3/uL (ref 0.0–0.1)
Basophils Relative: 0.4 % (ref 0.0–3.0)
Eosinophils Absolute: 0.1 10*3/uL (ref 0.0–0.7)
Eosinophils Relative: 1.3 % (ref 0.0–5.0)
HCT: 38.1 % (ref 36.0–46.0)
Hemoglobin: 12.4 g/dL (ref 12.0–15.0)
Lymphocytes Relative: 33 % (ref 12.0–46.0)
Lymphs Abs: 2 10*3/uL (ref 0.7–4.0)
MCHC: 32.6 g/dL (ref 30.0–36.0)
MCV: 88.3 fL (ref 78.0–100.0)
Monocytes Absolute: 0.3 10*3/uL (ref 0.1–1.0)
Monocytes Relative: 4.8 % (ref 3.0–12.0)
Neutro Abs: 3.6 10*3/uL (ref 1.4–7.7)
Neutrophils Relative %: 60.5 % (ref 43.0–77.0)
Platelets: 297 10*3/uL (ref 150.0–400.0)
RBC: 4.31 Mil/uL (ref 3.87–5.11)
RDW: 14.2 % (ref 11.5–15.5)
WBC: 5.9 10*3/uL (ref 4.0–10.5)

## 2023-01-11 LAB — COMPREHENSIVE METABOLIC PANEL
ALT: 15 U/L (ref 0–35)
AST: 13 U/L (ref 0–37)
Albumin: 4.7 g/dL (ref 3.5–5.2)
Alkaline Phosphatase: 66 U/L (ref 39–117)
BUN: 15 mg/dL (ref 6–23)
CO2: 25 meq/L (ref 19–32)
Calcium: 9.8 mg/dL (ref 8.4–10.5)
Chloride: 105 meq/L (ref 96–112)
Creatinine, Ser: 0.89 mg/dL (ref 0.40–1.20)
GFR: 83.53 mL/min (ref >60.00)
Glucose, Bld: 93 mg/dL (ref 70–99)
Potassium: 4.1 meq/L (ref 3.5–5.1)
Sodium: 139 meq/L (ref 135–145)
Total Bilirubin: 0.9 mg/dL (ref 0.2–1.2)
Total Protein: 7 g/dL (ref 6.0–8.3)

## 2023-01-11 MED ORDER — FAMOTIDINE 20 MG PO TABS: 20.0000 mg | ORAL_TABLET | Freq: Two times a day (BID) | ORAL | 1 refills | Status: AC

## 2023-01-11 MED ORDER — ALBUTEROL SULFATE HFA 108 (90 BASE) MCG/ACT IN AERS: 2.0000 | INHALATION_SPRAY | Freq: Four times a day (QID) | RESPIRATORY_TRACT | 3 refills | Status: AC | PRN

## 2023-01-11 MED ORDER — SUMATRIPTAN SUCCINATE 100 MG PO TABS: 100.0000 mg | ORAL_TABLET | ORAL | 5 refills | Status: AC | PRN

## 2023-01-11 NOTE — Assessment & Plan Note (Signed)
Worse lately  In setting of crohn's (which can affect esoph)  Avoids triggers Some stress  Continues omep 40 mg daily  Added generic pepcid today 20 mg bid Plan follow up with GI   May be adding to cp

## 2023-01-11 NOTE — Progress Notes (Signed)
Subjective:    Patient ID: Kim Walsh, female    DOB: January 16, 1986, 37 y.o.   MRN: [id number]  HPI  Wt Readings from Last 3 Encounters:  01/11/23 166 lb 2 oz (75.4 kg)  12/20/22 171 lb (77.6 kg)  08/24/22 170 lb (77.1 kg)   28.07 kg/m  Vitals:   01/11/23 1206  BP: (!) 92/58  Pulse: 68  Temp: 98.3 F (36.8 C)  SpO2: 98%    Pt presents with c/o back pain and chest pain  2 months    Recurrent cp  Thought it was acid reflux or pulled muscle   Then went on vacation and it happened again   Feels it in between shoulder blades and middle to right chest  Feels like pressure /discomfort  Not sharp or stabbing  No rash or skin change   Usually lingers for 4-5 hours  Intermittent  Usually every other day - more often than at first   Worse by - sitting with feet up with working  Worse with stress/ feeling anxious   Pressing on area makes it a little better   Has been working out more / rowing Dietitian - this started before  Working out does not cause the chest pain  No cough    No shortness of breath  No exercise intolerance  Some heartburn  No burping  No vomiting  No nausea or clamminess   (has some chronic nausea anyway so hard to tell)    Takes omeprazole 40 mg    EKG today Sinus bradycardia rate of 59 No acute changes  Dr Rhea Belton put her on steroid for crohn's  , now off of it  The crohn's causes nausea  Currently taking bentyl and zofran  Considering rizie ? After getting labs   Stress reaction is a little better  Takes 1/2 buspar in evening as needed    Migraines worse lately  Needs imitrex prn - uses infrequently   Keeps albuterol on hand - only needs if she has a cold   Cxr today DG Chest 2 View  Result Date: 01/11/2023 CLINICAL DATA:  Substernal chest pain EXAM: CHEST - 2 VIEW COMPARISON:  05/07/2021 FINDINGS: The heart size and mediastinal contours are within normal limits. Both lungs are clear. The visualized skeletal  structures are unremarkable. IMPRESSION: No active cardiopulmonary disease. Electronically Signed   By: Judie Petit.  Shick M.D.   On: 01/11/2023 14:40     Patient Active Problem List   Diagnosis Date Noted   Chest pain 01/11/2023   Acid reflux 01/11/2023   Cerumen impaction 08/18/2022   Ileitis 08/18/2022   Stress reaction 08/18/2022   Left wrist pain 11/02/2021   Sore throat 08/21/2021   Cyst of skin 06/24/2021   Kidney lesion, native, right 05/07/2021   Abdominal pain 05/07/2021   Productive cough 05/07/2021   Elevated LFTs 05/07/2021   Grief reaction 05/07/2021   Fibroid 11/28/2018   Heavy menses 11/24/2018   Lip swelling 11/24/2018   Lower thoracic back pain 01/11/2017   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   Plantar fasciitis, bilateral 03/13/2015   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   Routine general medical examination at a health care facility 03/06/2012   Atopic dermatitis 09/07/2010   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   Change in hearing 07/16/2010   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/02/2006   SICKLE-CELL TRAIT 08/02/2006   Eczema 08/02/2006   Migraine 08/02/2006   ANA positive 08/02/2006   Past Medical History:  Diagnosis Date   Congenitally  short metatarsal    Bilateral    Eczema    Elevated LFTs    Fatty liver    Gastritis    Headache(784.0)    Hiatal hernia    Positive ANA (antinuclear antibody)    Renal cyst, right    Tonsillitis    Past Surgical History:  Procedure Laterality Date   CHOLECYSTECTOMY     TONSILLECTOMY     Social History   Tobacco Use   Smoking status: Never    Passive exposure: Past   Smokeless tobacco: Never  Vaping Use   Vaping status: Never Used  Substance Use Topics   Alcohol use: Not Currently    Comment: occassionally wine   Drug use: No   Family History  Problem Relation Age of Onset   Arthritis/Rheumatoid Mother    Breast cancer Maternal Grandmother    Breast cancer Maternal Aunt    Cancer Maternal Uncle        Non Hodgkins/Colon    Esophageal cancer Neg Hx    Allergies  Allergen Reactions   Shrimp [Shellfish Allergy] Swelling   Pseudoephedrine Itching and Rash    REACTION: face itched, rash   Tramadol Nausea Only    DIZZINESS AND FAINT FEELING   Current Outpatient Medications on File Prior to Visit  Medication Sig Dispense Refill   acyclovir (ZOVIRAX) 200 MG capsule Take 2 capsules (400 mg total) by mouth 4 (four) times daily as needed (for lip swelling). 30 capsule 1   busPIRone (BUSPAR) 15 MG tablet Take 0.5 tablets (7.5 mg total) by mouth 2 (two) times daily. (Patient taking differently: Take 7.5 mg by mouth daily as needed.) 30 tablet 3   dicyclomine (BENTYL) 20 MG tablet Take 1 tablet (20 mg total) by mouth every 6 (six) hours as needed (Abdominal cramping). 100 tablet 5   omeprazole (PRILOSEC) 40 MG capsule TAKE 1 CAPSULE (40 MG TOTAL) BY MOUTH DAILY. 30 capsule 5   ondansetron (ZOFRAN) 4 MG tablet Take 1 tablet (4 mg total) by mouth every 8 (eight) hours as needed for nausea. 90 tablet 5   mometasone (ELOCON) 0.1 % cream Apply topically daily. Apply to affected area for eczema once daily as needed - small amount. (Patient not taking: Reported on 01/11/2023) 45 g 3   No current facility-administered medications on file prior to visit.    Review of Systems  Constitutional:  Positive for fatigue. Negative for activity change, appetite change, fever and unexpected weight change.  HENT:  Negative for congestion, ear pain, rhinorrhea, sinus pressure and sore throat.   Eyes:  Negative for pain, redness and visual disturbance.  Respiratory:  Negative for cough, shortness of breath and wheezing.   Cardiovascular:  Negative for chest pain and palpitations.  Gastrointestinal:  Positive for nausea. Negative for abdominal distention, abdominal pain, blood in stool, constipation, diarrhea, rectal pain and vomiting.       Heartburn  Occational upper abd pain and nausea   Endocrine: Negative for polydipsia and polyuria.   Genitourinary:  Negative for dysuria, frequency and urgency.  Musculoskeletal:  Negative for arthralgias, back pain and myalgias.  Skin:  Negative for pallor and rash.  Allergic/Immunologic: Negative for environmental allergies.  Neurological:  Negative for dizziness, syncope and headaches.  Hematological:  Negative for adenopathy. Does not bruise/bleed easily.  Psychiatric/Behavioral:  Negative for decreased concentration and dysphoric mood. The patient is not nervous/anxious.        Objective:   Physical Exam Constitutional:      General:  She is not in acute distress.    Appearance: She is well-developed and normal weight. She is not ill-appearing or diaphoretic.  HENT:     Head: Normocephalic and atraumatic.     Mouth/Throat:     Mouth: Mucous membranes are moist.     Pharynx: Oropharynx is clear.  Eyes:     General: No scleral icterus.    Conjunctiva/sclera: Conjunctivae normal.     Pupils: Pupils are equal, round, and reactive to light.  Cardiovascular:     Rate and Rhythm: Normal rate and regular rhythm.     Pulses: Normal pulses.     Heart sounds: Normal heart sounds. No murmur heard. Pulmonary:     Effort: Pulmonary effort is normal. No respiratory distress.     Breath sounds: Normal breath sounds. No stridor. No wheezing, rhonchi or rales.     Comments: Tender over right ant ribs /upper  No crepitus  No skin change  Chest:     Chest wall: Tenderness present.  Abdominal:     General: Abdomen is flat. Bowel sounds are normal. There is no distension.     Palpations: Abdomen is soft. There is no hepatomegaly, splenomegaly or mass.     Tenderness: There is abdominal tenderness in the right upper quadrant and epigastric area. There is no guarding or rebound. Negative signs include Murphy's sign, Rovsing's sign and McBurney's sign.     Hernia: No hernia is present.  Musculoskeletal:     Cervical back: Normal range of motion and neck supple.     Right lower leg: No  edema.     Left lower leg: No edema.  Lymphadenopathy:     Cervical: No cervical adenopathy.  Skin:    General: Skin is warm and dry.     Coloration: Skin is not pale.     Findings: No erythema.  Neurological:     Mental Status: She is alert.     Cranial Nerves: No cranial nerve deficit.     Motor: No weakness.  Psychiatric:        Mood and Affect: Mood normal.           Assessment & Plan:   Problem List Items Addressed This Visit       Digestive   Acid reflux    Worse lately  In setting of crohn's (which can affect esoph)  Avoids triggers Some stress  Continues omep 40 mg daily  Added generic pepcid today 20 mg bid Plan follow up with GI   May be adding to cp      Relevant Medications   famotidine (PEPCID) 20 MG tablet     Other   Chest pain - Primary    Chest pain that sometimes rad to back /shoulder blade area on right Sub sternal to right No vascular red flags  Reassuring EKG Reassuring exam and vitals - some discomfort with palp of right ant ribs  Cxr ordered -normal also   May be multi factorial GERD is worse / has crohn's that can affect esoph (will add pepcid 40 mg bid to omep 40 she is already taking) and continue GI follow up  Has had ccy  Doubt CBD stone since not constant or sharp , labs pending  Stress may worsen this (not as bad lately)  Tenderness may indicate muscular issue  Encouraged use of warm compresses   Will update in 1-2 wk  Call back and Er precautions noted in detail today  Relevant Orders   EKG 12-Lead (Completed)   DG Chest 2 View (Completed)   Comprehensive metabolic panel   CBC with Differential/Platelet   Other Visit Diagnoses     Need for influenza vaccination       Relevant Orders   Flu vaccine trivalent PF, 6mos and older(Flulaval,Afluria,Fluarix,Fluzone) (Completed)

## 2023-01-11 NOTE — Patient Instructions (Addendum)
If you take imitrex and it gives you chest pain=- stop it and let us know   Chest xray today   EKG looks good   Use a warm compress on chest when it hurts Take omeprazole as prescribed   Add pepcid 20 mg twice daily for extra acid Discuss esophageal/ reflux symptoms with your GI doctor   Update if not starting to improve in a week or if worsening    If symptoms suddenly worsen or become severe go to the ER

## 2023-01-11 NOTE — Assessment & Plan Note (Addendum)
Chest pain that sometimes rad to back /shoulder blade area on right Sub sternal to right No vascular red flags  Reassuring EKG Reassuring exam and vitals - some discomfort with palp of right ant ribs  Cxr ordered -normal also   May be multi factorial GERD is worse / has crohn's that can affect esoph (will add pepcid 40 mg bid to omep 40 she is already taking) and continue GI follow up  Has had ccy  Doubt CBD stone since not constant or sharp , labs pending  Stress may worsen this (not as bad lately)  Tenderness may indicate muscular issue  Encouraged use of warm compresses   Will update in 1-2 wk  Call back and Er precautions noted in detail today

## 2023-01-19 ENCOUNTER — Ambulatory Visit: Payer: 59 | Admitting: Family Medicine

## 2023-02-01 ENCOUNTER — Encounter: Payer: Self-pay | Admitting: Family Medicine

## 2023-02-01 ENCOUNTER — Other Ambulatory Visit (HOSPITAL_COMMUNITY)
Admission: RE | Admit: 2023-02-01 | Discharge: 2023-02-01 | Disposition: A | Discharge status: Home / Self Care | Payer: 59 | Source: Ambulatory Visit | Attending: Family Medicine | Admitting: Family Medicine

## 2023-02-01 ENCOUNTER — Ambulatory Visit: Payer: 59 | Admitting: Family Medicine

## 2023-02-01 VITALS — BP 106/70 | HR 66 | Temp 98.3°F | Ht 64.5 in | Wt 169.2 lb

## 2023-02-01 DIAGNOSIS — Z01419 Encounter for gynecological examination (general) (routine) without abnormal findings: Secondary | ICD-10-CM | POA: Diagnosis present

## 2023-02-01 NOTE — Assessment & Plan Note (Addendum)
Routine exam No history of abn pap  Declines STD screening   Pt has some heavy menses from fibroid but not terribly painful   Considering pregnancy and on PNV daily  Pap done and pending

## 2023-02-01 NOTE — Progress Notes (Signed)
Subjective:    Patient ID: Kim Walsh, female    DOB: [birth year], 37 y.o.   MRN: [id number]  HPI  Wt Readings from Last 3 Encounters:  02/01/23 169 lb 4 oz (76.8 kg)  01/11/23 166 lb 2 oz (75.4 kg)  12/20/22 171 lb (77.6 kg)   28.60 kg/m  Vitals:   02/01/23 0931  BP: 106/70  Pulse: 66  Temp: 98.3 F (36.8 C)  SpO2: 97%   Pt presents for routine gyn exam   Self breast exam: has dense tissue/ gets discomfort from that  Feels like her right breast is bigger than the left   Briefly breast fed her child/ had a hard time getting milk to come in   Had ultrasound years ago   MGM and M aunts with breast cancer   Menses- less painful since her last pregnancy  Has fibroids  Still heavy  Usually last 5-6 days   Contraception  Does not need   May want to get pregnant   Declines STD testing    Last pap was 08/2017 normal with neg HPV and neg for candida or BV, also neg for trich/ gc/chlamyd  This was with Dr Vergie Living   Lab Results  Component Value Date   WBC 5.9 01/11/2023   HGB 12.4 01/11/2023   HCT 38.1 01/11/2023   MCV 88.3 01/11/2023   PLT 297.0 01/11/2023     Patient Active Problem List   Diagnosis Date Noted   Encounter for routine gynecological examination 02/01/2023   Chest pain 01/11/2023   Acid reflux 01/11/2023   Cerumen impaction 08/18/2022   Ileitis 08/18/2022   Stress reaction 08/18/2022   Left wrist pain 11/02/2021   Sore throat 08/21/2021   Cyst of skin 06/24/2021   Kidney lesion, native, right 05/07/2021   Abdominal pain 05/07/2021   Productive cough 05/07/2021   Elevated LFTs 05/07/2021   Grief reaction 05/07/2021   Fibroid 11/28/2018   Heavy menses 11/24/2018   Lip swelling 11/24/2018   Lower thoracic back pain 01/11/2017   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   Plantar fasciitis, bilateral 03/13/2015   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   Routine general medical examination at a health care facility 03/06/2012   Atopic dermatitis  09/07/2010   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   Change in hearing 07/16/2010   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/02/2006   SICKLE-CELL TRAIT 08/02/2006   Eczema 08/02/2006   Migraine 08/02/2006   ANA positive 08/02/2006   Past Medical History:  Diagnosis Date   Congenitally short metatarsal    Bilateral    Eczema    Elevated LFTs    Fatty liver    Gastritis    Headache(784.0)    Hiatal hernia    Positive ANA (antinuclear antibody)    Renal cyst, right    Tonsillitis    Past Surgical History:  Procedure Laterality Date   CHOLECYSTECTOMY     TONSILLECTOMY     Social History   Tobacco Use   Smoking status: Never    Passive exposure: Past   Smokeless tobacco: Never  Vaping Use   Vaping status: Never Used  Substance Use Topics   Alcohol use: Not Currently    Comment: occassionally wine   Drug use: No   Family History  Problem Relation Age of Onset   Arthritis/Rheumatoid Mother    Breast cancer Maternal Grandmother    Breast cancer Maternal Aunt    Cancer Maternal Uncle        Non Hodgkins/Colon  Esophageal cancer Neg Hx    Allergies  Allergen Reactions   Shrimp [Shellfish Allergy] Swelling   Pseudoephedrine Itching and Rash    REACTION: face itched, rash   Tramadol Nausea Only    DIZZINESS AND FAINT FEELING   Current Outpatient Medications on File Prior to Visit  Medication Sig Dispense Refill   acyclovir (ZOVIRAX) 200 MG capsule Take 2 capsules (400 mg total) by mouth 4 (four) times daily as needed (for lip swelling). 30 capsule 1   albuterol (VENTOLIN HFA) 108 (90 Base) MCG/ACT inhaler Inhale 2 puffs into the lungs every 6 (six) hours as needed for wheezing or shortness of breath. 8 g 3   busPIRone (BUSPAR) 15 MG tablet Take 0.5 tablets (7.5 mg total) by mouth 2 (two) times daily. (Patient taking differently: Take 7.5 mg by mouth daily as needed.) 30 tablet 3   dicyclomine (BENTYL) 20 MG tablet Take 1 tablet (20 mg total) by mouth every 6 (six) hours as  needed (Abdominal cramping). 100 tablet 5   famotidine (PEPCID) 20 MG tablet Take 1 tablet (20 mg total) by mouth 2 (two) times daily. 60 tablet 1   mometasone (ELOCON) 0.1 % cream Apply topically daily. Apply to affected area for eczema once daily as needed - small amount. 45 g 3   omeprazole (PRILOSEC) 40 MG capsule TAKE 1 CAPSULE (40 MG TOTAL) BY MOUTH DAILY. 30 capsule 5   ondansetron (ZOFRAN) 4 MG tablet Take 1 tablet (4 mg total) by mouth every 8 (eight) hours as needed for nausea. 90 tablet 5   SUMAtriptan (IMITREX) 100 MG tablet Take 1 tablet (100 mg total) by mouth every 2 (two) hours as needed for migraine. May repeat in 2 hours if headache persists or recurs. 10 tablet 5   No current facility-administered medications on file prior to visit.    Review of Systems  Constitutional:  Negative for activity change, appetite change, fatigue, fever and unexpected weight change.  HENT:  Negative for congestion, ear pain, rhinorrhea, sinus pressure and sore throat.   Eyes:  Negative for pain, redness and visual disturbance.  Respiratory:  Negative for cough, shortness of breath and wheezing.   Cardiovascular:  Negative for chest pain and palpitations.  Gastrointestinal:  Negative for abdominal pain, blood in stool, constipation and diarrhea.  Endocrine: Negative for polydipsia and polyuria.  Genitourinary:  Negative for dysuria, frequency and urgency.  Musculoskeletal:  Negative for arthralgias, back pain and myalgias.  Skin:  Negative for pallor and rash.  Allergic/Immunologic: Negative for environmental allergies.  Neurological:  Negative for dizziness, syncope and headaches.  Hematological:  Negative for adenopathy. Does not bruise/bleed easily.  Psychiatric/Behavioral:  Negative for decreased concentration and dysphoric mood. The patient is not nervous/anxious.        Objective:   Physical Exam Constitutional:      General: She is not in acute distress.    Appearance: Normal  appearance. She is well-developed and normal weight. She is not ill-appearing or diaphoretic.  HENT:     Head: Normocephalic and atraumatic.  Eyes:     Conjunctiva/sclera: Conjunctivae normal.     Pupils: Pupils are equal, round, and reactive to light.  Neck:     Thyroid: No thyromegaly.     Vascular: No carotid bruit or JVD.  Cardiovascular:     Rate and Rhythm: Normal rate and regular rhythm.     Heart sounds: Normal heart sounds.     No gallop.  Pulmonary:     Effort:  Pulmonary effort is normal. No respiratory distress.     Breath sounds: Normal breath sounds. No wheezing or rales.  Abdominal:     General: There is no distension or abdominal bruit.     Palpations: Abdomen is soft.  Genitourinary:    Comments: Breast exam: No mass, nodules, thickening, tenderness, bulging, retraction, inflamation, nipple discharge or skin changes noted.  No axillary or clavicular LA.                  Anus appears normal w/o hemorrhoids or masses       External genitalia : nl appearance and hair distribution/no lesions       Urethral meatus : nl size, no lesions or prolapse       Urethra: no masses, tenderness or scarring      Bladder : no masses or tenderness       Vagina: nl general appearance, no discharge or  Lesions, no significant cystocele  or rectocele       Cervix: no lesions/ discharge or friability      Uterus: nl size, contour, position, and mobility (not fixed) , non tender      Adnexa : no masses, tenderness, enlargement or nodularity          Musculoskeletal:     Cervical back: Normal range of motion and neck supple.     Right lower leg: No edema.     Left lower leg: No edema.  Lymphadenopathy:     Cervical: No cervical adenopathy.  Skin:    General: Skin is warm and dry.     Coloration: Skin is not pale.     Findings: No rash.  Neurological:     Mental Status: She is alert.     Deep Tendon Reflexes: Reflexes are normal and symmetric.  Psychiatric:        Mood and  Affect: Mood normal.           Assessment & Plan:   Problem List Items Addressed This Visit       Other   Encounter for routine gynecological examination - Primary    Routine exam No history of abn pap  Declines STD screening   Pt has some heavy menses from fibroid but not terribly painful   Considering pregnancy and on PNV daily  Pap done and pending       Relevant Orders   Cytology - PAP()

## 2023-02-01 NOTE — Patient Instructions (Addendum)
If you are not trying to prevent pregnancy - stay on the prenatal vitamin   If your insurance covers a baseline mammogram (usually after 35) let me know and we can order   Take care of yourself

## 2023-02-02 LAB — CYTOLOGY - PAP
Adequacy: ABSENT
Comment: NEGATIVE
Diagnosis: NEGATIVE
High risk HPV: NEGATIVE

## 2023-05-17 ENCOUNTER — Ambulatory Visit (INDEPENDENT_AMBULATORY_CARE_PROVIDER_SITE_OTHER): Payer: 59 | Admitting: Internal Medicine

## 2023-05-17 ENCOUNTER — Encounter: Payer: Self-pay | Admitting: Internal Medicine

## 2023-05-17 VITALS — BP 110/62 | HR 77 | Temp 98.5°F | Ht 64.5 in | Wt 158.0 lb

## 2023-05-17 DIAGNOSIS — J0140 Acute pansinusitis, unspecified: Secondary | ICD-10-CM | POA: Diagnosis not present

## 2023-05-17 MED ORDER — AMOXICILLIN-POT CLAVULANATE 875-125 MG PO TABS: 1.0000 | ORAL_TABLET | Freq: Two times a day (BID) | ORAL | 0 refills | Status: DC

## 2023-05-17 NOTE — Assessment & Plan Note (Signed)
Seems to have secondary bacterial infection Analgesics Augmentin 875 bid x 7 days

## 2023-05-17 NOTE — Progress Notes (Signed)
Subjective:    Patient ID: Kim Walsh, female    DOB: February 15, 1986, 38 y.o.   MRN: [id number]  HPI Here due to respiratory symptoms and sinus congestion  Exposed to daughter who was sick--but negative for flu/RSV/COVID Now passing around husband and her Mostly had sinus congestion No wheezing, SOB or chest symptoms Started about 10 days  Has to mouth breath due to nasal congestion Seemed to be improving--but now drainage has turned yellow/green and feels worse No fever Still no SOB Frontal and maxillary pressure--"like a balloon" Seems to not clear colds since COVID--- 3 years ago  Sore throat last night No ear pain  Using dayquil/nyquil and saline nasal spray Also mucinex nasal --brief No tylenol or pain relievers Did try ibuprofen for headache  Current Outpatient Medications on File Prior to Visit  Medication Sig Dispense Refill   acyclovir (ZOVIRAX) 200 MG capsule Take 2 capsules (400 mg total) by mouth 4 (four) times daily as needed (for lip swelling). 30 capsule 1   albuterol (VENTOLIN HFA) 108 (90 Base) MCG/ACT inhaler Inhale 2 puffs into the lungs every 6 (six) hours as needed for wheezing or shortness of breath. 8 g 3   dicyclomine (BENTYL) 20 MG tablet Take 1 tablet (20 mg total) by mouth every 6 (six) hours as needed (Abdominal cramping). 100 tablet 5   famotidine (PEPCID) 20 MG tablet Take 1 tablet (20 mg total) by mouth 2 (two) times daily. 60 tablet 1   mometasone (ELOCON) 0.1 % cream Apply topically daily. Apply to affected area for eczema once daily as needed - small amount. 45 g 3   omeprazole (PRILOSEC) 40 MG capsule TAKE 1 CAPSULE (40 MG TOTAL) BY MOUTH DAILY. 30 capsule 5   ondansetron (ZOFRAN) 4 MG tablet Take 1 tablet (4 mg total) by mouth every 8 (eight) hours as needed for nausea. 90 tablet 5   SUMAtriptan (IMITREX) 100 MG tablet Take 1 tablet (100 mg total) by mouth every 2 (two) hours as needed for migraine. May repeat in 2 hours if headache  persists or recurs. 10 tablet 5   busPIRone (BUSPAR) 15 MG tablet Take 0.5 tablets (7.5 mg total) by mouth 2 (two) times daily. (Patient taking differently: Take 7.5 mg by mouth daily as needed.) 30 tablet 3   No current facility-administered medications on file prior to visit.    Allergies  Allergen Reactions   Shrimp [Shellfish Allergy] Swelling   Pseudoephedrine Itching and Rash    REACTION: face itched, rash   Tramadol Nausea Only    DIZZINESS AND FAINT FEELING    Past Medical History:  Diagnosis Date   Congenitally short metatarsal    Bilateral    Eczema    Elevated LFTs    Fatty liver    Gastritis    Headache(784.0)    Hiatal hernia    Positive ANA (antinuclear antibody)    Renal cyst, right    Tonsillitis     Past Surgical History:  Procedure Laterality Date   CHOLECYSTECTOMY     TONSILLECTOMY      Family History  Problem Relation Age of Onset   Arthritis/Rheumatoid Mother    Breast cancer Maternal Grandmother    Breast cancer Maternal Aunt    Cancer Maternal Uncle        Non Hodgkins/Colon   Esophageal cancer Neg Hx     Social History   Socioeconomic History   Marital status: Single    Spouse name: Not on file  Number of children: 0   Years of education: Not on file   Highest education level: Not on file  Occupational History   Occupation: CNA at [employer]  Tobacco Use   Smoking status: Never    Passive exposure: Past   Smokeless tobacco: Never  Vaping Use   Vaping status: Never Used  Substance and Sexual Activity   Alcohol use: Not Currently    Comment: occassionally wine   Drug use: No   Sexual activity: Yes    Partners: Male    Birth control/protection: Condom  Other Topics Concern   Not on file  Social History Narrative   Not on file   Social Drivers of Health   Financial Resource Strain: Low Risk  (04/11/2020)   Received from [facility], [facility]   Overall Financial Resource Strain (CARDIA)    Difficulty of  Paying Living Expenses: Not hard at all  Food Insecurity: Not on file  Transportation Needs: Not on file  Physical Activity: Not on file  Stress: No Stress Concern Present (04/11/2020)   Received from [facility], [facility]   [employer] of Occupational Health - Occupational Stress Questionnaire    Feeling of Stress : Only a little  Social Connections: Unknown (08/12/2021)   Received from [facility], [facility]   Social Network    Social Network: Not on file  Intimate Partner Violence: Unknown (07/08/2021)   Received from [facility], [facility]   HITS    Physically Hurt: Not on file    Insult or Talk Down To: Not on file    Threaten Physical Harm: Not on file    Scream or Curse: Not on file   Review of Systems No N/V from this--gets some nausea with Crohn's Uses bentyl prn Is eating some    Objective:   Physical Exam Constitutional:      Appearance: Normal appearance.  HENT:     Head:     Comments: Maxillary and frontal tenderness    Right Ear: Tympanic membrane and ear canal normal.     Left Ear: Tympanic membrane and ear canal normal.     Mouth/Throat:     Pharynx: No oropharyngeal exudate or posterior oropharyngeal erythema.  Pulmonary:     Effort: Pulmonary effort is normal.     Breath sounds: Normal breath sounds. No wheezing or rales.  Musculoskeletal:     Cervical back: Neck supple.  Lymphadenopathy:     Cervical: No cervical adenopathy.  Neurological:     Mental Status: She is alert.            Assessment & Plan:

## 2023-06-03 NOTE — Progress Notes (Deleted)
 Office Visit Note  Patient: Kim Walsh             Date of Birth: 01/16/1986           MRN: [id number]             PCP: Judy Pimple, MD Referring: Tower, Audrie Gallus, MD Visit Date: 06/16/2023 Occupation: @GUAROCC @  Subjective:  No chief complaint on file.   History of Present Illness: Kim Walsh is a 38 y.o. female ***     Activities of Daily Living:  Patient reports morning stiffness for *** {minute/hour:19697}.   Patient {ACTIONS;DENIES/REPORTS:21021675::"Denies"} nocturnal pain.  Difficulty dressing/grooming: {ACTIONS;DENIES/REPORTS:21021675::"Denies"} Difficulty climbing stairs: {ACTIONS;DENIES/REPORTS:21021675::"Denies"} Difficulty getting out of chair: {ACTIONS;DENIES/REPORTS:21021675::"Denies"} Difficulty using hands for taps, buttons, cutlery, and/or writing: {ACTIONS;DENIES/REPORTS:21021675::"Denies"}  No Rheumatology ROS completed.   PMFS History:  Patient Active Problem List   Diagnosis Date Noted   Acute non-recurrent pansinusitis 05/17/2023   Encounter for routine gynecological examination 02/01/2023   Chest pain 01/11/2023   Acid reflux 01/11/2023   Cerumen impaction 08/18/2022   Ileitis 08/18/2022   Stress reaction 08/18/2022   Left wrist pain 11/02/2021   Sore throat 08/21/2021   Cyst of skin 06/24/2021   Kidney lesion, native, right 05/07/2021   Abdominal pain 05/07/2021   Productive cough 05/07/2021   Elevated LFTs 05/07/2021   Grief reaction 05/07/2021   Fibroid 11/28/2018   Heavy menses 11/24/2018   Lip swelling 11/24/2018   Lower thoracic back pain 01/11/2017   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   Plantar fasciitis, bilateral 03/13/2015   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   Routine general medical examination at a health care facility 03/06/2012   Atopic dermatitis 09/07/2010   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   Change in hearing 07/16/2010   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/02/2006   SICKLE-CELL TRAIT 08/02/2006   Eczema  08/02/2006   Migraine 08/02/2006   ANA positive 08/02/2006    Past Medical History:  Diagnosis Date   Congenitally short metatarsal    Bilateral    Eczema    Elevated LFTs    Fatty liver    Gastritis    Headache(784.0)    Hiatal hernia    Positive ANA (antinuclear antibody)    Renal cyst, right    Tonsillitis     Family History  Problem Relation Age of Onset   Arthritis/Rheumatoid Mother    Breast cancer Maternal Grandmother    Breast cancer Maternal Aunt    Cancer Maternal Uncle        Non Hodgkins/Colon   Esophageal cancer Neg Hx    Past Surgical History:  Procedure Laterality Date   CHOLECYSTECTOMY     TONSILLECTOMY     Social History   Social History Narrative   Not on file   Immunization History  Administered Date(s) Administered   Hpv-Unspecified 11/06/2007, 11/18/2008, 02/20/2009, 12/12/2009   Influenza, Seasonal, Injecte, Preservative Fre 01/08/2016, 01/05/2017, 01/19/2018, 01/16/2019, 01/15/2020, 02/02/2021, 01/11/2023   Meningococcal polysaccharide vaccine (MPSV4) 02/20/2009   PFIZER(Purple Top)SARS-COV-2 Vaccination 04/26/2019, 05/16/2019   Td 11/18/2008   Tdap 01/22/2020     Objective: Vital Signs: There were no vitals taken for this visit.   Physical Exam   Musculoskeletal Exam: ***  CDAI Exam: CDAI Score: -- Patient Global: --; Provider Global: -- Swollen: --; Tender: -- Joint Exam 06/16/2023   No joint exam has been documented for this visit   There is currently no information documented on the homunculus. Go to the Rheumatology activity and complete the homunculus joint exam.  Investigation: No additional findings.  Imaging: No results found.  Recent Labs: Lab Results  Component Value Date   WBC 5.9 01/11/2023   HGB 12.4 01/11/2023   PLT 297.0 01/11/2023   NA 139 01/11/2023   K 4.1 01/11/2023   CL 105 01/11/2023   CO2 25 01/11/2023   GLUCOSE 93 01/11/2023   BUN 15 01/11/2023   CREATININE 0.89 01/11/2023   BILITOT 0.9  01/11/2023   ALKPHOS 66 01/11/2023   AST 13 01/11/2023   ALT 15 01/11/2023   PROT 7.0 01/11/2023   ALBUMIN 4.7 01/11/2023   CALCIUM 9.8 01/11/2023   GFRAA 97 11/28/2018   QFTBGOLDPLUS NEGATIVE 12/20/2022    Speciality Comments: No specialty comments available.  Procedures:  No procedures performed Allergies: Shrimp [shellfish allergy], Pseudoephedrine, and Tramadol   Assessment / Plan:     Visit Diagnoses: No diagnosis found.  Orders: No orders of the defined types were placed in this encounter.  No orders of the defined types were placed in this encounter.   Face-to-face time spent with patient was *** minutes. Greater than 50% of time was spent in counseling and coordination of care.  Follow-Up Instructions: No follow-ups on file.   Ellen Henri, CMA  Note - This record has been created using Animal nutritionist.  Chart creation errors have been sought, but may not always  have been located. Such creation errors do not reflect on  the standard of medical care.

## 2023-06-16 ENCOUNTER — Ambulatory Visit: Payer: 59 | Admitting: Rheumatology

## 2023-06-16 DIAGNOSIS — B009 Herpesviral infection, unspecified: Secondary | ICD-10-CM

## 2023-06-16 DIAGNOSIS — Z8669 Personal history of other diseases of the nervous system and sense organs: Secondary | ICD-10-CM

## 2023-06-16 DIAGNOSIS — M791 Myalgia, unspecified site: Secondary | ICD-10-CM

## 2023-06-16 DIAGNOSIS — Z8261 Family history of arthritis: Secondary | ICD-10-CM

## 2023-06-16 DIAGNOSIS — R5383 Other fatigue: Secondary | ICD-10-CM

## 2023-06-16 DIAGNOSIS — L308 Other specified dermatitis: Secondary | ICD-10-CM

## 2023-06-16 DIAGNOSIS — M722 Plantar fascial fibromatosis: Secondary | ICD-10-CM

## 2023-06-16 DIAGNOSIS — N289 Disorder of kidney and ureter, unspecified: Secondary | ICD-10-CM

## 2023-06-16 DIAGNOSIS — K529 Noninfective gastroenteritis and colitis, unspecified: Secondary | ICD-10-CM

## 2023-06-16 DIAGNOSIS — D573 Sickle-cell trait: Secondary | ICD-10-CM

## 2023-06-16 DIAGNOSIS — M546 Pain in thoracic spine: Secondary | ICD-10-CM

## 2023-06-16 DIAGNOSIS — R768 Other specified abnormal immunological findings in serum: Secondary | ICD-10-CM

## 2023-07-26 ENCOUNTER — Ambulatory Visit: Payer: Self-pay | Admitting: Family Medicine

## 2023-07-26 ENCOUNTER — Encounter: Payer: Self-pay | Admitting: Internal Medicine

## 2023-07-26 ENCOUNTER — Ambulatory Visit (INDEPENDENT_AMBULATORY_CARE_PROVIDER_SITE_OTHER): Admitting: Internal Medicine

## 2023-07-26 VITALS — BP 100/60 | HR 62 | Temp 98.3°F | Ht 64.5 in | Wt 155.0 lb

## 2023-07-26 DIAGNOSIS — R413 Other amnesia: Secondary | ICD-10-CM | POA: Diagnosis not present

## 2023-07-26 DIAGNOSIS — R112 Nausea with vomiting, unspecified: Secondary | ICD-10-CM

## 2023-07-26 NOTE — Assessment & Plan Note (Signed)
 Didn't remember taking out tampon-but it is not there Lots of stress Some trouble sleeping Taking first vacation with just husband this week No other neurologic symptoms  Discussed adequate sleep, etc If ongoing trouble, would reevaluate with lab testing, etc

## 2023-07-26 NOTE — Progress Notes (Signed)
 Subjective:    Patient ID: Kim Walsh, female    DOB: [birth year], 38 y.o.   MRN: [id number]  HPI Here due to concern about a retained tampon  Uses tampons regularly Using smaller size due to being near the end--2 days ago Would likely have been done with her bleeding Issues with memory--doesn't remember taking it out  Having nausea--thinks it may be from her Crohn's Appetite is off Sees Dr Bridgett Camps for this--last Rx steroid and not on biologic now  Current Outpatient Medications on File Prior to Visit  Medication Sig Dispense Refill   acyclovir  (ZOVIRAX ) 200 MG capsule Take 2 capsules (400 mg total) by mouth 4 (four) times daily as needed (for lip swelling). 30 capsule 1   albuterol  (VENTOLIN  HFA) 108 (90 Base) MCG/ACT inhaler Inhale 2 puffs into the lungs every 6 (six) hours as needed for wheezing or shortness of breath. 8 g 3   busPIRone  (BUSPAR ) 15 MG tablet Take 0.5 tablets (7.5 mg total) by mouth 2 (two) times daily. (Patient taking differently: Take 7.5 mg by mouth daily as needed.) 30 tablet 3   dicyclomine  (BENTYL ) 20 MG tablet Take 1 tablet (20 mg total) by mouth every 6 (six) hours as needed (Abdominal cramping). 100 tablet 5   famotidine  (PEPCID ) 20 MG tablet Take 1 tablet (20 mg total) by mouth 2 (two) times daily. 60 tablet 1   mometasone  (ELOCON ) 0.1 % cream Apply topically daily. Apply to affected area for eczema once daily as needed - small amount. 45 g 3   omeprazole  (PRILOSEC) 40 MG capsule TAKE 1 CAPSULE (40 MG TOTAL) BY MOUTH DAILY. 30 capsule 5   ondansetron  (ZOFRAN ) 4 MG tablet Take 1 tablet (4 mg total) by mouth every 8 (eight) hours as needed for nausea. 90 tablet 5   SUMAtriptan  (IMITREX ) 100 MG tablet Take 1 tablet (100 mg total) by mouth every 2 (two) hours as needed for migraine. May repeat in 2 hours if headache persists or recurs. 10 tablet 5   No current facility-administered medications on file prior to visit.    Allergies  Allergen  Reactions   Shrimp [Shellfish Allergy] Swelling   Pseudoephedrine Itching and Rash    REACTION: face itched, rash   Tramadol Nausea Only    DIZZINESS AND FAINT FEELING    Past Medical History:  Diagnosis Date   Congenitally short metatarsal    Bilateral    Eczema    Elevated LFTs    Fatty liver    Gastritis    Headache(784.0)    Hiatal hernia    Positive ANA (antinuclear antibody)    Renal cyst, right    Tonsillitis     Past Surgical History:  Procedure Laterality Date   CHOLECYSTECTOMY     TONSILLECTOMY      Family History  Problem Relation Age of Onset   Arthritis/Rheumatoid Mother    Breast cancer Maternal Grandmother    Breast cancer Maternal Aunt    Cancer Maternal Uncle        Non Hodgkins/Colon   Esophageal cancer Neg Hx     Social History   Socioeconomic History   Marital status: Single    Spouse name: Not on file   Number of children: 0   Years of education: Not on file   Highest education level: Not on file  Occupational History   Occupation: CNA at [employer]  Tobacco Use   Smoking status: Never    Passive exposure: Past  Smokeless tobacco: Never  Vaping Use   Vaping status: Never Used  Substance and Sexual Activity   Alcohol use: Not Currently    Comment: occassionally wine   Drug use: No   Sexual activity: Yes    Partners: Male    Birth control/protection: Condom  Other Topics Concern   Not on file  Social History Narrative   Not on file   Social Drivers of Health   Financial Resource Strain: Low Risk  (04/11/2020)   Received from [facility], [facility]   Overall Financial Resource Strain (CARDIA)    Difficulty of Paying Living Expenses: Not hard at all  Food Insecurity: Not on file  Transportation Needs: Not on file  Physical Activity: Not on file  Stress: No Stress Concern Present (04/11/2020)   Received from [facility], [facility]   [employer] of Occupational Health - Occupational Stress  Questionnaire    Feeling of Stress : Only a little  Social Connections: Unknown (08/12/2021)   Received from [facility], [facility]   Social Network    Social Network: Not on file  Intimate Partner Violence: Unknown (07/08/2021)   Received from [facility], [facility]   HITS    Physically Hurt: Not on file    Insult or Talk Down To: Not on file    Threaten Physical Harm: Not on file    Scream or Curse: Not on file   Review of Systems     Objective:   Physical Exam Genitourinary:    Comments: Normal introitus No obvious tampon visible Bimanual exam shows normal uterus---thorough exam shows vagina completely empty           Assessment & Plan:

## 2023-07-26 NOTE — Telephone Encounter (Signed)
 Routing to PCP as a FYI and Dr. Joelle Musca who is seeing pt today

## 2023-07-26 NOTE — Telephone Encounter (Signed)
 Will check her today at the appointment

## 2023-07-26 NOTE — Telephone Encounter (Signed)
 Copied from CRM [id number]. Topic: Clinical - Red Word Triage >> Jul 26, 2023  8:26 AM Rosamond Comes wrote: Red Word that prompted transfer to Nurse Triage: patient calling in thinks she has tampon stuck   Chief Complaint: Tampon stuck Symptoms: nausea Frequency: intermittent Pertinent Negatives: Patient denies pain Disposition: [] ED /[] Urgent Care (no appt availability in office) / [x] Appointment(In office/virtual)/ []  [facility]/ [] Home Care/ [] Refused Recommended Disposition /[] [facility]/ []  Follow-up with PCP Additional Notes: Pt reports tampon stuck x 2 days. Unsure if its actually lodged in, but she doesn't recall removing it. Denies actual fever, but reports "feeling warm" last night.    Reason for Disposition  [1] Vaginal FB AND [2] can't remove at home  Answer Assessment - Initial Assessment Questions 1. OBJECT: "What do you think it is?"      Tampon  2. SIZE: "How large is it?"      Small/regular flow  3. PAIN: "Are you having any pain?" If Yes, ask: "How bad is it?"  (Scale 1-10; or mild, moderate, severe)     No pain  4. ONSET: "How long do you think the foreign body has been there?"      2 days  5. MECHANISM: "Tell me how it happened."     Memory slipped and doesn't recall removing it  6. OTHER SYMPTOMS: "Do you have any other symptoms?" (e.g., vaginal discharge, fever, rash)     Nausea, low grade fever  7. PREGNANCY: "Is there any chance you are pregnant?" "When was your last menstrual period?"     no  Protocols used: Vaginal Foreign Body-A-AH

## 2023-08-11 ENCOUNTER — Telehealth: Payer: Self-pay | Admitting: Internal Medicine

## 2023-08-11 NOTE — Telephone Encounter (Signed)
 Pt finished steroids and has been having issues with abd pain and nausea. States Dr Bridgett Camps mentioned starting her on Skyrizi and she would like to get this started. Pt scheduled to see Reginal Capra PA 08/12/23@3pm .

## 2023-08-11 NOTE — Telephone Encounter (Signed)
 Requesting to speak with a nurse in regards to chromes symptoms. Please advise.

## 2023-08-12 ENCOUNTER — Encounter: Payer: Self-pay | Admitting: Physician Assistant

## 2023-08-12 ENCOUNTER — Ambulatory Visit (INDEPENDENT_AMBULATORY_CARE_PROVIDER_SITE_OTHER): Admitting: Physician Assistant

## 2023-08-12 ENCOUNTER — Other Ambulatory Visit: Payer: Self-pay | Admitting: Family Medicine

## 2023-08-12 VITALS — BP 118/68 | HR 61 | Ht 64.0 in | Wt 159.2 lb

## 2023-08-12 DIAGNOSIS — K529 Noninfective gastroenteritis and colitis, unspecified: Secondary | ICD-10-CM | POA: Diagnosis not present

## 2023-08-12 DIAGNOSIS — R768 Other specified abnormal immunological findings in serum: Secondary | ICD-10-CM | POA: Diagnosis not present

## 2023-08-12 NOTE — Patient Instructions (Signed)
 Your provider has requested that you go to the basement level for lab work before leaving today. Press "B" on the elevator. The lab is located at the first door on the left as you exit the elevator.  _______________________________________________________  If your blood pressure at your visit was 140/90 or greater, please contact your primary care physician to follow up on this.  _______________________________________________________  If you are age 37 or older, your body mass index should be between 23-30. Your Body mass index is 27.33 kg/m. If this is out of the aforementioned range listed, please consider follow up with your Primary Care Provider.  If you are age 49 or younger, your body mass index should be between 19-25. Your Body mass index is 27.33 kg/m. If this is out of the aformentioned range listed, please consider follow up with your Primary Care Provider.   ________________________________________________________  The [facility] providers would like to encourage you to use MYCHART to communicate with providers for non-urgent requests or questions.  Due to long hold times on the telephone, sending your provider a message by [facility] may be a faster and more efficient way to get a response.  Please allow 48 business hours for a response.  Please remember that this is for non-urgent requests.  _______________________________________________________

## 2023-08-12 NOTE — Progress Notes (Signed)
 Chief Complaint: Follow-up Crohn's disease  HPI:    Kim Walsh is a 38 year old female with a past medical history as listed below, including ileal Crohn's disease, elevated ANA without definitive rheumatologic diagnosis, arthropathy, prior liver enzyme elevation and sickle cell trait, known to Dr. Bridgett Camps, who presents to clinic today to follow-up her Crohn's disease.    06/22/2022 colonoscopy with mild inflammation in the very distal terminal ileum, otherwise normal.  Biopsy showed ileitis.  Thought possibly mild Crohn's disease.  She was started on Entocort 9 mg daily for 8 weeks.    12/20/2022 patient seen in clinic by Dr. Bridgett Camps at that time symptoms were better with Entocort.  Occasional loose stools and return of nausea and vomiting.  At that time discussed ileal Crohn's disease with intermittent diarrhea and nausea, thought possibly recurrent Crohn's disease causing her symptoms since she had improvement with Budesonide .  She had fecal calprotectin ordered as well as CMP, quant to [facility] gold and hepatitis B serology, CT enterography, Bentyl  given and ondansetron .  Discussed if she had elevated fecal Cal or positive disease by enterography then would recommend Skyrizi.    Patient never proceeded with CT enterography.  Fecal calprotectin was not done.  All labs returned normal.    Today, patient presents to clinic and explains that she was on the Budesonide  and it really helped with all of her symptoms.  She took it for 12 weeks and felt much better, but since being off of the medicine she feels like all of her symptoms have come back now over the past month or so worse than before with abdominal spasms occasional nausea occasional days of vomiting, fatigue, diarrhea that is often urgent and then occasional constipation.  She has not seen any bleeding.  She is asking about starting the [employer].  She never completed fecal calprotectin or CT enterography, partially due to cost.    Denies fever,  chills, weight loss,  Past Medical History:  Diagnosis Date   Congenitally short metatarsal    Bilateral    Eczema    Elevated LFTs    Fatty liver    Gastritis    Headache(784.0)    Hiatal hernia    Positive ANA (antinuclear antibody)    Renal cyst, right    Tonsillitis     Past Surgical History:  Procedure Laterality Date   CHOLECYSTECTOMY     TONSILLECTOMY      Current Outpatient Medications  Medication Sig Dispense Refill   acyclovir  (ZOVIRAX ) 200 MG capsule Take 2 capsules (400 mg total) by mouth 4 (four) times daily as needed (for lip swelling). 30 capsule 1   albuterol  (VENTOLIN  HFA) 108 (90 Base) MCG/ACT inhaler Inhale 2 puffs into the lungs every 6 (six) hours as needed for wheezing or shortness of breath. 8 g 3   busPIRone  (BUSPAR ) 15 MG tablet Take 0.5 tablets (7.5 mg total) by mouth 2 (two) times daily. (Patient taking differently: Take 7.5 mg by mouth daily as needed.) 30 tablet 3   dicyclomine  (BENTYL ) 20 MG tablet Take 1 tablet (20 mg total) by mouth every 6 (six) hours as needed (Abdominal cramping). 100 tablet 5   famotidine  (PEPCID ) 20 MG tablet Take 1 tablet (20 mg total) by mouth 2 (two) times daily. 60 tablet 1   mometasone  (ELOCON ) 0.1 % cream Apply topically daily. Apply to affected area for eczema once daily as needed - small amount. 45 g 3   omeprazole  (PRILOSEC) 40 MG capsule TAKE 1 CAPSULE (40  MG TOTAL) BY MOUTH DAILY. 30 capsule 5   ondansetron  (ZOFRAN ) 4 MG tablet Take 1 tablet (4 mg total) by mouth every 8 (eight) hours as needed for nausea. 90 tablet 5   SUMAtriptan  (IMITREX ) 100 MG tablet Take 1 tablet (100 mg total) by mouth every 2 (two) hours as needed for migraine. May repeat in 2 hours if headache persists or recurs. 10 tablet 5   No current facility-administered medications for this visit.    Allergies as of 08/12/2023 - Review Complete 07/26/2023  Allergen Reaction Noted   Shrimp [shellfish allergy] Swelling 03/06/2012   Pseudoephedrine  Itching and Rash 08/02/2006   Tramadol Nausea Only 11/08/2017    Family History  Problem Relation Age of Onset   Arthritis/Rheumatoid Mother    Breast cancer Maternal Grandmother    Breast cancer Maternal Aunt    Cancer Maternal Uncle        Non Hodgkins/Colon   Esophageal cancer Neg Hx     Social History   Socioeconomic History   Marital status: Single    Spouse name: Not on file   Number of children: 0   Years of education: Not on file   Highest education level: Not on file  Occupational History   Occupation: CNA at [employer]  Tobacco Use   Smoking status: Never    Passive exposure: Past   Smokeless tobacco: Never  Vaping Use   Vaping status: Never Used  Substance and Sexual Activity   Alcohol use: Not Currently    Comment: occassionally wine   Drug use: No   Sexual activity: Yes    Partners: Male    Birth control/protection: Condom  Other Topics Concern   Not on file  Social History Narrative   Not on file   Social Drivers of Health   Financial Resource Strain: Low Risk  (04/11/2020)   Received from [facility], [facility]   Overall Financial Resource Strain (CARDIA)    Difficulty of Paying Living Expenses: Not hard at all  Food Insecurity: Not on file  Transportation Needs: Not on file  Physical Activity: Not on file  Stress: No Stress Concern Present (04/11/2020)   Received from [facility], [facility]   [employer] [facility] Occupational [facility] - Occupational Stress Questionnaire    Feeling of Stress : Only a little  Social Connections: Unknown (08/12/2021)   Received from [facility], [facility]   Social Network    Social Network: Not on file  Intimate Partner Violence: Unknown (07/08/2021)   Received from [facility], [facility]   HITS    Physically Hurt: Not on file    Insult or Talk Down To: Not on file    Threaten Physical Harm: Not on file    Scream or Curse: Not on file    Review of Systems:    Constitutional: No  weight loss, fever or chills Cardiovascular: No chest pain Respiratory: No SOB  Gastrointestinal: See HPI and otherwise negative   Physical Exam:  Vital signs: BP 118/68   Pulse 61   Ht 5\' 4"  (1.626 m)   Wt 159 lb 3.2 oz (72.2 kg)   LMP 07/17/2023   BMI 27.33 kg/m    Constitutional:   Pleasant AA female appears to be in NAD, Well developed, Well nourished, alert and cooperative Respiratory: Respirations even and unlabored. Lungs clear to auscultation bilaterally.   No wheezes, crackles, or rhonchi.  Cardiovascular: Normal S1, S2. No MRG. Regular rate and rhythm. No peripheral  edema, cyanosis or pallor.  Gastrointestinal:  Soft, nondistended, nontender. No rebound or guarding. Normal bowel sounds. No appreciable masses or hepatomegaly. Rectal:  Not performed.  Psychiatric: Oriented to person, place and time. Demonstrates good judgement and reason without abnormal affect or behaviors.  RELEVANT LABS AND IMAGING: CBC    Component Value Date/Time   WBC 5.9 01/11/2023 1301   RBC 4.31 01/11/2023 1301   HGB 12.4 01/11/2023 1301   HGB 12.3 11/28/2018 1147   HCT 38.1 01/11/2023 1301   HCT 35.1 11/28/2018 1147   PLT 297.0 01/11/2023 1301   PLT 289 11/28/2018 1147   MCV 88.3 01/11/2023 1301   MCV 87 11/28/2018 1147   MCH 29.2 05/28/2022 1057   MCHC 32.6 01/11/2023 1301   RDW 14.2 01/11/2023 1301   RDW 12.2 11/28/2018 1147   LYMPHSABS 2.0 01/11/2023 1301   MONOABS 0.3 01/11/2023 1301   EOSABS 0.1 01/11/2023 1301   BASOSABS 0.0 01/11/2023 1301    CMP     Component Value Date/Time   NA 139 01/11/2023 1301   NA 140 11/28/2018 1147   K 4.1 01/11/2023 1301   CL 105 01/11/2023 1301   CO2 25 01/11/2023 1301   GLUCOSE 93 01/11/2023 1301   BUN 15 01/11/2023 1301   BUN 11 11/28/2018 1147   CREATININE 0.89 01/11/2023 1301   CREATININE 0.85 05/28/2022 1057   CALCIUM 9.8 01/11/2023 1301   PROT 7.0 01/11/2023 1301   PROT 7.2 11/28/2018 1147   ALBUMIN 4.7 01/11/2023 1301   ALBUMIN  4.7 11/28/2018 1147   AST 13 01/11/2023 1301   ALT 15 01/11/2023 1301   ALKPHOS 66 01/11/2023 1301   BILITOT 0.9 01/11/2023 1301   BILITOT 0.4 11/28/2018 1147   GFRNONAA >60 09/19/2021 1131   GFRNONAA >89 11/07/2014 1551   GFRAA 97 11/28/2018 1147   GFRAA >89 11/07/2014 1551    Assessment: 1.  Crohn's disease: Colonoscopy in March 2024 with ileitis and possible Crohn's disease, patient initially treated with Budesonide  and did well, she was worked up to start Skyrizi with fecal calprotectin and CT enterography still pending, but at the time never proceeded, now symptoms are back after being off of budesonide  and wonders about the [place] 2.  Positive ANA/arthropathy type symptoms: No definitive diagnosis   Plan: 1.  Discussed with patient that it does not feel like she has a definitive diagnosis of Crohn's disease given that her biopsies just suggested Crohn's, explained that I think Dr. Bridgett Camps was getting more information including fecal calprotectin and CT enterography to support the diagnosis so that we could start Skyrizi.  Patient tells me the CT enterography is likely still too expensive for her.  She never returned the fecal calprotectin because she misunderstand what this was for.  She would really like to get on some sort of medicine because she is not feeling well now. 2.  Discussed restarting Budesonide  for now but she would like to hold given that this may make imaging or other testing less likely to show it is going on. 3.  Will reorder fecal calprotectin 4.  Will send a note to Dr. Bridgett Camps and have him review, if you would like her started on Skyrizi without any of this additional testing we can certainly do that and explain that to her today. 5.  Patient to follow in clinic per recommendations from Dr. Bridgett Camps.  Reginal Capra, PA-C [facility] 08/12/2023, 3:06 PM  Cc: Tower, Manley Seeds, MD   Addendum: I agree with the  fecal calprotectin which I see has been  submitted I also agree that Kim Walsh would be appropriate but I would recommend we get a CT enterography first; this would be important information to have prior to starting biologic therapy Once this is done we can initiate Skyrizi therapy. JMP

## 2023-08-15 ENCOUNTER — Other Ambulatory Visit

## 2023-08-15 ENCOUNTER — Telehealth: Payer: Self-pay | Admitting: *Deleted

## 2023-08-15 DIAGNOSIS — K5000 Crohn's disease of small intestine without complications: Secondary | ICD-10-CM

## 2023-08-15 DIAGNOSIS — K529 Noninfective gastroenteritis and colitis, unspecified: Secondary | ICD-10-CM

## 2023-08-15 DIAGNOSIS — R768 Other specified abnormal immunological findings in serum: Secondary | ICD-10-CM

## 2023-08-15 NOTE — Telephone Encounter (Signed)
 Patient has been scheduled at [facility] for CT enterography on 08/22/23 at 4:30 pm, 3:15 pm arrival. NPO 4 hours prior.   I have spoken to patient to advise of this information.   Patient expresses frustration that she came for office visit but wasn't really given any medications to help her symptoms and was not given any new advice on what was to be done for her condition.  Explained that fecal calprotectin must be completed in order to get insurance approval for biologic therapy. Also advised that CT enterography will give us  a better idea of severity. Patient says she is agreeable to CT scan, but did explain at her last office visit that she was not working and CT would be a financial burden, requesting that she only have this if absolutely necessary to begin biologic therapy. States Dr Bridgett Camps told her that this was not 100% necessary, though preferred.   Dr Bridgett Camps, please advise. At this point, is CT enterography necessary or is this something she is able to continue holding off on?

## 2023-08-15 NOTE — Telephone Encounter (Signed)
 It is needed for assessment of disease activity before biologic initiation and may be needed again in the future After CT she can be started on entocort 9 mg daily x 8 weeks, this will help induce remission, but not maintain it, thus she will still need biologic regardless of response She responded favorable to budesonide  in the past as documented JMP

## 2023-08-15 NOTE — Telephone Encounter (Signed)
-----   Message from Amber Bail Pyrtle sent at 08/15/2023 11:33 AM EDT ----- Bridgette Campus, See my addendum I do think she should have CT enterography I am routing to pod a triage

## 2023-08-16 MED ORDER — BUDESONIDE 3 MG PO CPEP: 9.0000 mg | ORAL_CAPSULE | Freq: Every day | ORAL | 1 refills | Status: AC

## 2023-08-16 NOTE — Telephone Encounter (Signed)
 I have advised patient of Dr Marietta Shorter response. She is in agreement with this plan. Will keep CT enterography scheduled for 08/22/23. In addition, rx for budesonide  9 mg x 8 weeks has been sent to pharmacy and patient advised to begin the medication AFTER enterography. She verbalizes understanding.

## 2023-08-17 ENCOUNTER — Ambulatory Visit: Payer: Self-pay | Admitting: Physician Assistant

## 2023-08-17 LAB — CALPROTECTIN, FECAL: Calprotectin, Fecal: 209 ug/g — ABNORMAL HIGH (ref 0–120)

## 2023-08-22 ENCOUNTER — Ambulatory Visit (HOSPITAL_COMMUNITY)
Admission: RE | Admit: 2023-08-22 | Discharge: 2023-08-22 | Disposition: A | Discharge status: Home / Self Care | Source: Ambulatory Visit | Attending: Internal Medicine | Admitting: Internal Medicine

## 2023-08-22 DIAGNOSIS — K5000 Crohn's disease of small intestine without complications: Secondary | ICD-10-CM | POA: Diagnosis present

## 2023-08-22 DIAGNOSIS — R768 Other specified abnormal immunological findings in serum: Secondary | ICD-10-CM | POA: Diagnosis present

## 2023-08-22 MED ORDER — IOHEXOL 300 MG/ML  SOLN
125.0000 mL | Freq: Once | INTRAMUSCULAR | Status: AC | PRN
  Administered 2023-08-22: 125 mL via INTRAVENOUS

## 2023-08-22 MED ORDER — SODIUM CHLORIDE (PF) 0.9 % IJ SOLN
INTRAMUSCULAR | Status: AC
  Filled 2023-08-22: qty 50

## 2023-08-25 ENCOUNTER — Ambulatory Visit: Payer: Self-pay | Admitting: Internal Medicine

## 2023-08-25 ENCOUNTER — Telehealth: Payer: Self-pay | Admitting: Internal Medicine

## 2023-08-25 NOTE — Telephone Encounter (Signed)
 Inbound call from patient, returning dorothy's call, in regards to CT results.

## 2023-08-25 NOTE — Telephone Encounter (Signed)
 See imaging result note dated 08/15/23 for additional information.

## 2023-08-30 ENCOUNTER — Other Ambulatory Visit: Payer: Self-pay | Admitting: Internal Medicine

## 2023-08-31 ENCOUNTER — Other Ambulatory Visit: Payer: Self-pay | Admitting: Internal Medicine

## 2023-08-31 NOTE — Progress Notes (Signed)
 600 mg IV x 3 at week #0, week #4 and week #8  Then  300 mg by SQ injection at week #12 and then every 8 weeks thereafter  [facility]

## 2023-09-01 ENCOUNTER — Other Ambulatory Visit (HOSPITAL_COMMUNITY): Payer: Self-pay

## 2023-09-01 ENCOUNTER — Telehealth: Payer: Self-pay

## 2023-09-01 ENCOUNTER — Other Ambulatory Visit: Payer: Self-pay

## 2023-09-01 ENCOUNTER — Encounter: Payer: Self-pay | Admitting: Internal Medicine

## 2023-09-01 ENCOUNTER — Other Ambulatory Visit (HOSPITAL_COMMUNITY): Payer: Self-pay | Admitting: Internal Medicine

## 2023-09-01 DIAGNOSIS — K5000 Crohn's disease of small intestine without complications: Secondary | ICD-10-CM

## 2023-09-01 DIAGNOSIS — R7989 Other specified abnormal findings of blood chemistry: Secondary | ICD-10-CM

## 2023-09-01 MED ORDER — SKYRIZI 360 MG/2.4ML ~~LOC~~ SOCT
360.0000 mg | SUBCUTANEOUS | 5 refills | Status: DC
  Filled 2023-11-28: qty 2.4, fill #0

## 2023-09-01 NOTE — Telephone Encounter (Signed)
 Pharmacy Patient Advocate Encounter   Received notification from Physician's Office that prior authorization for Skyrizi 360MG /2.4ML (150MG /ML) single-dose prefilled cartridge with on-body injector is required/requested.   Insurance verification completed.   The patient is insured through [employer] .   Prior Authorization for [employer] 360MG /2.4ML (150MG /ML) single-dose prefilled cartridge with on-body injector has been APPROVED from 08-02-2023 to 02-28-2024   PA #/Case ID/Reference #: [id number]

## 2023-09-01 NOTE — Telephone Encounter (Addendum)
 Hello,  Auth Submission: PENDING Site of care: Site of care: [facility] Payer: aetna Medication & CPT/J Code(s) submitted: Skyrizi (Risankizumab-rzaa) [phone] Route of submission (phone, fax, portal): portal Phone # Fax # Auth type: Buy/Bill PB Units/visits requested: 600mg , q4weeks x3doses   This Siegfried Dress is for iv induction doses and sq maintenance.. please start auth process for SQ dose of 360mg  q8 weeks  Thanks

## 2023-09-14 ENCOUNTER — Other Ambulatory Visit: Payer: Self-pay | Admitting: Internal Medicine

## 2023-09-19 ENCOUNTER — Telehealth: Payer: Self-pay

## 2023-09-19 DIAGNOSIS — K50819 Crohn's disease of both small and large intestine with unspecified complications: Secondary | ICD-10-CM

## 2023-09-19 NOTE — Telephone Encounter (Signed)
 Left message for patient to call back. TB Gold lab has been placed in EPIC.

## 2023-09-19 NOTE — Telephone Encounter (Signed)
 Hello,  Auth Submission: DENIED Site of care: Site of care: [facility] Payer: aetna Medication & CPT/J Code(s) submitted: Skyrizi  (Risankizumab -rzaa) [id number] Diagnosis Code:  Route of submission (phone, fax, portal): portal Phone # Fax # Auth type:  Units/visits requested:  Reference number: [id number]    Authorization has been DENIED because pt has not has a tb test within the last 6 months. The denial is scanned in the media tab.

## 2023-09-19 NOTE — Telephone Encounter (Signed)
 Patient needs QuantiFERON gold so that her Skyrizi  can be approved

## 2023-09-19 NOTE — Addendum Note (Signed)
 Addended by: Glennette Lanius on: 09/19/2023 01:57 PM   Modules accepted: Orders

## 2023-09-20 NOTE — Telephone Encounter (Signed)
 Patient is advised that she needs to have repeat TB gold blood testing at our lab as soon as possible so we can reattempt approval for Skyrizi . She verbalizes understanding.

## 2023-09-28 ENCOUNTER — Other Ambulatory Visit (INDEPENDENT_AMBULATORY_CARE_PROVIDER_SITE_OTHER)

## 2023-09-28 DIAGNOSIS — K50819 Crohn's disease of both small and large intestine with unspecified complications: Secondary | ICD-10-CM

## 2023-09-28 DIAGNOSIS — R768 Other specified abnormal immunological findings in serum: Secondary | ICD-10-CM | POA: Diagnosis not present

## 2023-09-28 DIAGNOSIS — K5000 Crohn's disease of small intestine without complications: Secondary | ICD-10-CM

## 2023-09-28 DIAGNOSIS — R112 Nausea with vomiting, unspecified: Secondary | ICD-10-CM | POA: Diagnosis not present

## 2023-09-28 DIAGNOSIS — R197 Diarrhea, unspecified: Secondary | ICD-10-CM

## 2023-09-28 LAB — COMPREHENSIVE METABOLIC PANEL WITH GFR
ALT: 13 U/L (ref 0–35)
AST: 14 U/L (ref 0–37)
Albumin: 4.3 g/dL (ref 3.5–5.2)
Alkaline Phosphatase: 50 U/L (ref 39–117)
BUN: 13 mg/dL (ref 6–23)
CO2: 28 meq/L (ref 19–32)
Calcium: 9.3 mg/dL (ref 8.4–10.5)
Chloride: 108 meq/L (ref 96–112)
Creatinine, Ser: 1.02 mg/dL (ref 0.40–1.20)
GFR: 70.57 mL/min (ref >60.00)
Glucose, Bld: 96 mg/dL (ref 70–99)
Potassium: 4.3 meq/L (ref 3.5–5.1)
Sodium: 142 meq/L (ref 135–145)
Total Bilirubin: 0.8 mg/dL (ref 0.2–1.2)
Total Protein: 7.2 g/dL (ref 6.0–8.3)

## 2023-10-02 LAB — QUANTIFERON-TB GOLD PLUS
Mitogen-NIL: 9.82 [IU]/mL
NIL: 0.01 [IU]/mL
QuantiFERON-TB Gold Plus: NEGATIVE
TB1-NIL: 0 [IU]/mL
TB2-NIL: 0 [IU]/mL

## 2023-10-03 ENCOUNTER — Ambulatory Visit: Payer: Self-pay | Admitting: Internal Medicine

## 2023-10-03 NOTE — Telephone Encounter (Signed)
 Patient did have repeat TB testing (negative). Can we now reattempt to get skyrizi  approval?

## 2023-10-10 ENCOUNTER — Encounter: Payer: Self-pay | Admitting: Family Medicine

## 2023-10-13 NOTE — Telephone Encounter (Signed)
Yes, I will resubmit.

## 2023-10-19 ENCOUNTER — Telehealth: Payer: Self-pay | Admitting: *Deleted

## 2023-10-19 ENCOUNTER — Telehealth: Payer: Self-pay

## 2023-10-19 DIAGNOSIS — Z809 Family history of malignant neoplasm, unspecified: Secondary | ICD-10-CM

## 2023-10-19 NOTE — Telephone Encounter (Signed)
 Copied from CRM [id number]. Topic: Clinical - Medical Advice >> Oct 19, 2023 11:45 AM Kim Walsh wrote: Reason for CRM: Patient called in stated she was just recently diagnosed with Crohn's disease, stated she would like to get a food sensitive test, to show her foods that she possible have a reaction, wanted to know if she could get a order to do the blood work for this

## 2023-10-19 NOTE — Telephone Encounter (Signed)
 I would like to get a mammogram on her (but cannot assure that insurance will cover so will have to find out) Would she also be interested a genetic consult if feasible?    Does she want to do mammo in [place] or [place] ?

## 2023-10-19 NOTE — Telephone Encounter (Signed)
 Pt said she will check with [facility] and if they don't offer that testing she will let us  know so we can proceed with allergist referral.  **pt did have another question for Dr. Randeen** Pt's grandmother (maternal) had breast cancer and pt's mother passed away from cancer but they didn't get to tell her what type of cancer she had because it was to advanced by time they found it so there is a chance mother had breast cancer too. Pt is asking if PCP thinks she needs to go ahead and start getting mammograms. If so pt is interested in mobile bus mammogram if that is an option please advise

## 2023-10-19 NOTE — Telephone Encounter (Signed)
 Not something that I can do here She will need to check with her GI specialist If she is concerned about a food allergy we can refer to allergist  Let me know what she wants to do  Thanks for the update

## 2023-10-19 NOTE — Telephone Encounter (Signed)
 Hello,   Patient will be scheduled as soon as possible.  Auth Submission: APPROVED Site of care: Site of care: [facility] Payer: Aetna Medication & CPT/J Code(s) submitted: Skyrizi  (Risankizumab -rzaa) [id number] Diagnosis Code:  Route of submission (phone, fax, portal): portal Phone # Fax # Auth type: Buy/Bill PB Units/visits requested: 600mg  q4weeks x 3 doses Reference number: [id number] Approval from: 10/17/23 to 01/16/24

## 2023-10-20 ENCOUNTER — Encounter: Payer: Self-pay | Admitting: Internal Medicine

## 2023-10-20 NOTE — Telephone Encounter (Signed)
 Please see pt message and advise

## 2023-10-21 NOTE — Telephone Encounter (Signed)
 Pt will check with insurance regarding mammogram and call us  back. Pt does want to proceed with genetic consult, [place], pt advised she should receive a call with in 2 weeks if not let us  know

## 2023-10-23 DIAGNOSIS — Z809 Family history of malignant neoplasm, unspecified: Secondary | ICD-10-CM | POA: Insufficient documentation

## 2023-10-23 NOTE — Telephone Encounter (Signed)
 I put the referral in for  Please let us  know if you don't hear in 1-2 weeks to set that up

## 2023-10-24 ENCOUNTER — Ambulatory Visit

## 2023-10-25 ENCOUNTER — Encounter: Payer: Self-pay | Admitting: Internal Medicine

## 2023-10-28 ENCOUNTER — Telehealth: Payer: Self-pay | Admitting: Internal Medicine

## 2023-10-28 ENCOUNTER — Other Ambulatory Visit (HOSPITAL_COMMUNITY): Payer: Self-pay | Admitting: Internal Medicine

## 2023-10-28 NOTE — Telephone Encounter (Signed)
 Dr Albertus-  Please see previous patient messages and advise. Patient is currently scheduled for her first Skyrizi  dose on this Monday, 10/31/23.

## 2023-10-28 NOTE — Telephone Encounter (Signed)
 I responded to the patient's MyChart message today to answer her questions

## 2023-10-28 NOTE — Telephone Encounter (Signed)
 Inbound call from patient stating she would like to speak to nurse in regards to the infusion center wanting her to start a new medication called [employer]. Patient has some questions and concerns about medication.  Patient is requesting a call back   Please advise  Thank you

## 2023-10-28 NOTE — Telephone Encounter (Signed)
 Patient advised that Dr Albertus responded to her recent messages and asked that she reach out should she have any additional concerns following reading his response.

## 2023-10-31 ENCOUNTER — Ambulatory Visit (INDEPENDENT_AMBULATORY_CARE_PROVIDER_SITE_OTHER)

## 2023-10-31 VITALS — BP 94/59 | HR 61 | Temp 98.2°F | Resp 16 | Ht 64.0 in | Wt 159.0 lb

## 2023-10-31 DIAGNOSIS — K529 Noninfective gastroenteritis and colitis, unspecified: Secondary | ICD-10-CM | POA: Diagnosis not present

## 2023-10-31 MED ORDER — SODIUM CHLORIDE 0.9 % IV SOLN
600.0000 mg | Freq: Once | INTRAVENOUS | Status: AC
  Administered 2023-10-31: 600 mg via INTRAVENOUS
  Filled 2023-10-31: qty 10

## 2023-10-31 NOTE — Telephone Encounter (Signed)
 Patient called to give her copay card information before her infusion today.  RX card info: Member ID: [id number] BIN: [id number] Group: [id number] PCN: [id number]  Credit Card info: Card # [id number] CVC: 218 Exp. 7/28  I have emailed this to [place] with [employer] to have this applied to her Skyrizi  infusion today.  I explained to the patient that sometimes patients receive a bill before the copay card can be applied since [employer] has to wait until the insurance pays their portion. I told her to call me if she receives a bill and we will fix it. She verbalized understanding and appreciation.

## 2023-10-31 NOTE — Progress Notes (Signed)
 Diagnosis: Crohn's Disease  Provider:  Praveen Mannam MD  Procedure: IV Infusion  IV Type: Peripheral, IV Location: L Forearm  Skyrizi  (risankizumab -rzaa), Dose: 600 mg  Infusion Start Time: 1350  Infusion Stop Time:1456  Post Infusion IV Care: Observation period completed  Discharge: Condition: Good, Destination: Home . AVS Provided  Performed by:  Leita FORBES Miles, LPN

## 2023-10-31 NOTE — Patient Instructions (Signed)
 Risankizumab Injection What is this medication? RISANKIZUMAB (RIS an KIZ ue mab) treats autoimmune conditions, such as psoriasis, arthritis, Crohn disease, and ulcerative colitis. It works by slowing down an overactive immune system.  It is a monoclonal antibody. This medicine may be used for other purposes; ask your health care provider or pharmacist if you have questions. COMMON BRAND NAME(S): Skyrizi What should I tell my care team before I take this medication? They need to know if you have any of these conditions: Hepatic disease Immune system problems Infection, such as tuberculosis (TB), bacterial, fungal or viral infections Recent or upcoming vaccine An unusual or allergic reaction to risankizumab, other medications, foods, dyes, or preservatives Pregnant or trying to get pregnant Breast-feeding How should I use this medication? This medication is injected into a vein or under the skin. It is given by your care team in a hospital or clinic setting. It may also be given at home. If you get this medication at home, you will be taught how to prepare and give it. Use exactly as directed. Take it as directed on the prescription label. Keep taking it unless your care team tells you to stop. If you use a pen, be sure to take off the outer needle cover before using the dose. It is important that you put your used needles and syringes in a special sharps container. Do not put them in a trash can. If you do not have a sharps container, call your pharmacist or care team to get one. A special MedGuide will be given to you by the pharmacist with each prescription and refill. Be sure to read this information carefully each time. This medication comes with INSTRUCTIONS FOR USE. Ask your pharmacist for directions on how to use this medication. Read the information carefully. Talk to your pharmacist or care team if you have questions. Talk to your care team about the use of this medication in children.  Special care may be needed. Overdosage: If you think you have taken too much of this medicine contact a poison control center or emergency room at once. NOTE: This medicine is only for you. Do not share this medicine with others. What if I miss a dose? It is important not to miss any doses. Talk to your care team about what to do if you miss a dose. What may interact with this medication? Do not take this medication with any of the following: Live vaccines This list may not describe all possible interactions. Give your health care provider a list of all the medicines, herbs, non-prescription drugs, or dietary supplements you use. Also tell them if you smoke, drink alcohol, or use illegal drugs. Some items may interact with your medicine. What should I watch for while using this medication? Visit your care team for regular checks on your progress. Tell your care team if your symptoms do not start to get better or if they get worse. You will be tested for tuberculosis (TB) before you start this medication. If your care team prescribes any medication for TB, you should start taking the TB medication before starting this medication. Make sure to finish the full course of TB medication. This medication may increase your risk of getting an infection. Call your care team for advice if you get a fever, chills, sore throat, or other symptoms of a cold or flu. Do not treat yourself. Try to avoid being around people who are sick. This medication can decrease the response to a vaccine. If you  need to get vaccinated, tell your care team if you have received this medication. Extra booster doses may be needed. Talk to your care team to see if a different vaccination schedule is needed. What side effects may I notice from receiving this medication? Side effects that you should report to your care team as soon as possible: Allergic reactions--skin rash, itching, hives, swelling of the face, lips, tongue, or  throat Infection--fever, chills, cough, sore throat, wounds that don't heal, pain or trouble when passing urine, general feeling of discomfort or being unwell Liver injury--right upper belly pain, loss of appetite, nausea, light-colored stool, dark yellow or brown urine, yellowing skin or eyes, unusual weakness or fatigue Side effects that usually do not require medical attention (report to your care team if they continue or are bothersome): Fatigue Headache Pain, redness, or irritation at injection site Runny or stuffy nose Sore throat This list may not describe all possible side effects. Call your doctor for medical advice about side effects. You may report side effects to FDA at 1-800-FDA-1088. Where should I keep my medication? Keep out of the reach of children and pets. Store in a refrigerator. Do not freeze. Protect from light. Keep it in the original carton until you are ready to take it. See product for storage information. Each product may have different instructions. Remove the dose from the carton about 30 to 45 minutes before it is time for you to take it. Get rid of any unused medication after the expiration date. To get rid of medications that are no longer needed or have expired: Take the medication to a medication take-back program. Check with your pharmacy or law enforcement to find a location. If you cannot return the medication, ask your pharmacist or care team how to get rid of this medication safely. NOTE: This sheet is a summary. It may not cover all possible information. If you have questions about this medicine, talk to your doctor, pharmacist, or health care provider.  2024 Elsevier/Gold Standard (2023-03-04 00:00:00)

## 2023-11-14 MED ORDER — SKYRIZI 360 MG/2.4ML ~~LOC~~ SOCT
360.0000 mg | SUBCUTANEOUS | 5 refills | Status: DC
  Filled 2024-01-04: qty 2.4, 84d supply, fill #0

## 2023-11-28 ENCOUNTER — Ambulatory Visit

## 2023-11-28 ENCOUNTER — Other Ambulatory Visit: Payer: Self-pay

## 2023-11-28 VITALS — BP 106/68 | HR 62 | Temp 98.4°F | Resp 16 | Ht 64.0 in | Wt 159.6 lb

## 2023-11-28 DIAGNOSIS — K529 Noninfective gastroenteritis and colitis, unspecified: Secondary | ICD-10-CM

## 2023-11-28 MED ORDER — SODIUM CHLORIDE 0.9 % IV SOLN
600.0000 mg | Freq: Once | INTRAVENOUS | Status: AC
  Administered 2023-11-28: 600 mg via INTRAVENOUS
  Filled 2023-11-28: qty 10

## 2023-11-28 NOTE — Progress Notes (Signed)
 Diagnosis: Ileitis  Provider:  Praveen Mannam MD  Procedure: IV Infusion  IV Type: Peripheral, IV Location: L Forearm  Skyrizi  (risankizumab -rzaa), Dose: 600 mg  Infusion Start Time: 1204  Infusion Stop Time: 1311  Post Infusion IV Care: Peripheral IV Discontinued  Discharge: Condition: Good, Destination: Home . AVS Declined  Performed by:  Leita FORBES Miles, LPN

## 2023-11-29 ENCOUNTER — Other Ambulatory Visit (HOSPITAL_COMMUNITY): Payer: Self-pay

## 2023-11-30 ENCOUNTER — Other Ambulatory Visit (HOSPITAL_COMMUNITY): Payer: Self-pay

## 2023-12-13 ENCOUNTER — Other Ambulatory Visit (HOSPITAL_COMMUNITY): Payer: Self-pay

## 2023-12-13 ENCOUNTER — Other Ambulatory Visit: Payer: Self-pay

## 2023-12-29 ENCOUNTER — Other Ambulatory Visit (HOSPITAL_COMMUNITY): Payer: Self-pay

## 2023-12-29 NOTE — Telephone Encounter (Signed)
 Must be filled at outside [facility]. Insurance will not allow fill at [facility]

## 2024-01-03 ENCOUNTER — Ambulatory Visit (INDEPENDENT_AMBULATORY_CARE_PROVIDER_SITE_OTHER)

## 2024-01-03 VITALS — BP 96/61 | HR 74 | Temp 98.0°F | Resp 16 | Ht 64.0 in | Wt 162.2 lb

## 2024-01-03 DIAGNOSIS — K529 Noninfective gastroenteritis and colitis, unspecified: Secondary | ICD-10-CM

## 2024-01-03 MED ORDER — SODIUM CHLORIDE 0.9 % IV SOLN
600.0000 mg | Freq: Once | INTRAVENOUS | Status: AC
  Administered 2024-01-03: 600 mg via INTRAVENOUS
  Filled 2024-01-03: qty 10

## 2024-01-03 NOTE — Progress Notes (Signed)
 Diagnosis: IIeitis  Provider:  Lonna Coder MD  Procedure: IV Infusion  IV Type: Peripheral, IV Location: L Forearm  Skyrizi  (risankizumab -rzaa), Dose: 600 mg  Infusion Start Time: 1115  Infusion Stop Time: 1220  Post Infusion IV Care: Peripheral IV Discontinued  Discharge: Condition: Good, Destination: Home . AVS Declined  Performed by:  Daniel Ritthaler, RN

## 2024-01-04 ENCOUNTER — Other Ambulatory Visit: Payer: Self-pay

## 2024-01-04 ENCOUNTER — Other Ambulatory Visit (HOSPITAL_COMMUNITY): Payer: Self-pay

## 2024-01-04 MED ORDER — SKYRIZI 360 MG/2.4ML ~~LOC~~ SOCT: 360.0000 mg | SUBCUTANEOUS | 5 refills | Status: AC

## 2024-01-04 NOTE — Progress Notes (Signed)
 Patient to be enrolled with [facility]. Routed to Rx Prior Auth Team (ATTN: Odilia Bennett).

## 2024-01-04 NOTE — Telephone Encounter (Signed)
 It does not state. I will list if it does.   I am working on that now.

## 2024-01-04 NOTE — Telephone Encounter (Signed)
 Spoke to patient to advise of Dr Pamula response and recommendations. She verbalizes understanding. Lab orders entered in [facility] and patient will come at her convenience to have these completed.

## 2024-01-04 NOTE — Telephone Encounter (Signed)
 Spoke to patient to advise that we have sent Skyrizi  to [facility]. She should hear from them over the next few days and if she does not, she is to let us  know.  Patient says she already has a skyrizi  ambassador and the shannon continues to ask her if she has had any recent labs. Patient says she is feeling better on the skyrizi . She does say that she is a little concerned though that the ambassador keeps asking her about labs and she has had a history of isolated elevated LFT's.

## 2024-01-04 NOTE — Addendum Note (Signed)
 Addended by: CLAUDENE NAOMIE SAILOR on: 01/04/2024 10:28 AM   Modules accepted: Orders

## 2024-01-04 NOTE — Telephone Encounter (Signed)
 Routine lab work for monitoring specifically because of Skyrizi  is not specifically recommended Of course we follow QuantiFERON gold annually Given her history of isolated elevated LFTs it is okay to repeat CMP and CBC

## 2024-01-04 NOTE — Progress Notes (Signed)
 Script is being filled at [facility] due to insurance restrictions.

## 2024-01-04 NOTE — Addendum Note (Signed)
 Addended by: CLAUDENE NAOMIE SAILOR on: 01/04/2024 03:17 PM   Modules accepted: Orders

## 2024-01-04 NOTE — Telephone Encounter (Signed)
 Patient now enrolled in Skyrizi  Complete.

## 2024-01-30 ENCOUNTER — Other Ambulatory Visit (INDEPENDENT_AMBULATORY_CARE_PROVIDER_SITE_OTHER)

## 2024-01-30 ENCOUNTER — Ambulatory Visit: Payer: Self-pay | Admitting: Internal Medicine

## 2024-01-30 ENCOUNTER — Telehealth: Payer: Self-pay | Admitting: Internal Medicine

## 2024-01-30 DIAGNOSIS — K5000 Crohn's disease of small intestine without complications: Secondary | ICD-10-CM

## 2024-01-30 DIAGNOSIS — R7989 Other specified abnormal findings of blood chemistry: Secondary | ICD-10-CM

## 2024-01-30 LAB — CBC WITH DIFFERENTIAL/PLATELET
Basophils Absolute: 0 K/uL (ref 0.0–0.1)
Basophils Relative: 0.6 % (ref 0.0–3.0)
Eosinophils Absolute: 0.2 K/uL (ref 0.0–0.7)
Eosinophils Relative: 3.6 % (ref 0.0–5.0)
HCT: 36.3 % (ref 36.0–46.0)
Hemoglobin: 12.1 g/dL (ref 12.0–15.0)
Lymphocytes Relative: 34 % (ref 12.0–46.0)
Lymphs Abs: 2.1 K/uL (ref 0.7–4.0)
MCHC: 33.2 g/dL (ref 30.0–36.0)
MCV: 89.2 fl (ref 78.0–100.0)
Monocytes Absolute: 0.4 K/uL (ref 0.1–1.0)
Monocytes Relative: 6.5 % (ref 3.0–12.0)
Neutro Abs: 3.3 K/uL (ref 1.4–7.7)
Neutrophils Relative %: 55.3 % (ref 43.0–77.0)
Platelets: 371 K/uL (ref 150.0–400.0)
RBC: 4.08 Mil/uL (ref 3.87–5.11)
RDW: 14.4 % (ref 11.5–15.5)
WBC: 6 K/uL (ref 4.0–10.5)

## 2024-01-30 LAB — COMPREHENSIVE METABOLIC PANEL WITH GFR
ALT: 15 U/L (ref 0–35)
AST: 15 U/L (ref 0–37)
Albumin: 4.3 g/dL (ref 3.5–5.2)
Alkaline Phosphatase: 60 U/L (ref 39–117)
BUN: 15 mg/dL (ref 6–23)
CO2: 24 meq/L (ref 19–32)
Calcium: 9.2 mg/dL (ref 8.4–10.5)
Chloride: 107 meq/L (ref 96–112)
Creatinine, Ser: 0.77 mg/dL (ref 0.40–1.20)
GFR: 98.65 mL/min (ref >60.00)
Glucose, Bld: 101 mg/dL — ABNORMAL HIGH (ref 70–99)
Potassium: 4 meq/L (ref 3.5–5.1)
Sodium: 141 meq/L (ref 135–145)
Total Bilirubin: 0.5 mg/dL (ref 0.2–1.2)
Total Protein: 7.3 g/dL (ref 6.0–8.3)

## 2024-01-30 MED ORDER — SKYRIZI 360 MG/2.4ML ~~LOC~~ SOCT: 360.0000 mg | SUBCUTANEOUS | 5 refills | Status: AC

## 2024-01-30 NOTE — Telephone Encounter (Signed)
 Spoke to Levorn Caldron, certified pharmacy tech with [facility]. I have given verbal order for patient to get Skyrizi  360 mg on week 12 and every 8 weeks thereafter, #2.4 ml with 5 refills. Levorn Caldron states she will expedite this shipment.  I have spoken to patient about this this and she verbalizes understanding.  Additionally, patient inquires as to whether she is ok to get the flu shot. I advised that we do recommend flu shot to all of our immunocompromised patients. Advised as long as she gets injection with inactive virus, she is ok. She should just avoid any vaccine with live virus. She verbalizes understanding.

## 2024-01-30 NOTE — Telephone Encounter (Signed)
 I contacted [facility] to inquire about patient's Skyrizi  medication. Per Alissa with [facility], patient is unable to get medication with them but instead must use [facility]. Rx has been resent now to [facility].  Patient is advised.

## 2024-01-30 NOTE — Telephone Encounter (Signed)
 Patient stopped by after having bloodwork stating she had not heard anything else about the Skyrizzi nor had she heard anything from the [facility].  She said she was supposed to start her injections soon and wanted to know if you'd heard anything.  Please call patient and advise.  Thank you.

## 2024-01-30 NOTE — Telephone Encounter (Signed)
 Inbound call from patient stating that she spoke with [facility] in regards to her Skyrizi  and was told if Dottie can call them to give a Verbal prescription, her medication can be overnight to her. Patient provided a telephone number to call [phone]. Patient is also requesting a call back to be advised on weather her course on how to take her injection will be done with Dottie or her Skyrizi  ambassador. Please advise.

## 2024-01-31 ENCOUNTER — Inpatient Hospital Stay

## 2024-01-31 DIAGNOSIS — Z800 Family history of malignant neoplasm of digestive organs: Secondary | ICD-10-CM | POA: Diagnosis not present

## 2024-01-31 DIAGNOSIS — Z807 Family history of other malignant neoplasms of lymphoid, hematopoietic and related tissues: Secondary | ICD-10-CM | POA: Insufficient documentation

## 2024-01-31 DIAGNOSIS — Z8042 Family history of malignant neoplasm of prostate: Secondary | ICD-10-CM | POA: Insufficient documentation

## 2024-01-31 DIAGNOSIS — Z803 Family history of malignant neoplasm of breast: Secondary | ICD-10-CM | POA: Diagnosis not present

## 2024-01-31 DIAGNOSIS — Z1379 Encounter for other screening for genetic and chromosomal anomalies: Secondary | ICD-10-CM

## 2024-01-31 DIAGNOSIS — Z809 Family history of malignant neoplasm, unspecified: Secondary | ICD-10-CM

## 2024-01-31 NOTE — Progress Notes (Unsigned)
 REFERRING PROVIDER: Randeen Laine LABOR, MD [address] [place],  [place] [address]  PRIMARY PROVIDER:  Randeen Laine LABOR, MD  PRIMARY REASON FOR VISIT:  1. Family history of breast cancer   2. Family history of colon cancer   3. Family history of non-Hodgkin lymphoma   4. Family history of pancreatic cancer   5. Family history of prostate cancer   6. Family history of stomach cancer   7. Family history of cancer      HISTORY OF PRESENT ILLNESS:   Ms. Civello, a 38 y.o. female, was seen on 01/31/2024 for a [facility] cancer genetics consultation at the request of Dr. Randeen due to a family history of breast cancer.  Ms. Mccombs presents to clinic today to discuss the possibility of a hereditary predisposition to cancer, genetic testing, and to further clarify her future cancer risks, as well as potential cancer risks for family members.   CANCER HISTORY:  Oncology History   No history exists.     Ms. Popper is a 37 y.o. female with no personal history of cancer.    RELEVANT MEDICAL HISTORY AND RISK FACTORS:  Menarche was at age 53.  First live birth at age 58.  OCP use for approximately 0 years.  Ovaries intact: yes.  Hysterectomy: no.  Menopausal status: premenopausal.  HRT use: 0 years. Mammogram within the last year: no. Had previous mammogram for breast asymmetry in 2011 Breast density: unknown Number of breast biopsies: unknown. Up to date with pelvic exams: yes. Colonoscopy: yes; 06/22/2022 colonoscopy to evaluate episodic gastric pain showed mild inflammation in the very distal terminal ileum, otherwise normal per surgical pathology report. Biopsy showed ileitis. Any excessive radiation exposure in the past: no Other cancer screenings: no.  Exposures: no.   Past Medical History:  Diagnosis Date   Congenitally short metatarsal    Bilateral    Eczema    Elevated LFTs    Family history of breast cancer    Family history of colon cancer    Family  history of non-Hodgkin lymphoma    Family history of pancreatic cancer    Family history of prostate cancer    Family history of stomach cancer    Fatty liver    Gastritis    Headache(784.0)    Hiatal hernia    Positive ANA (antinuclear antibody)    Renal cyst, right    Tonsillitis     Past Surgical History:  Procedure Laterality Date   CHOLECYSTECTOMY     TONSILLECTOMY      Social History   Socioeconomic History   Marital status: Married    Spouse name: Not on file   Number of children: 0   Years of education: Not on file   Highest education level: Not on file  Occupational History   Occupation: CNA at [employer]  Tobacco Use   Smoking status: Never    Passive exposure: Past   Smokeless tobacco: Never  Vaping Use   Vaping status: Never Used  Substance and Sexual Activity   Alcohol use: Not Currently    Comment: occassionally wine   Drug use: No   Sexual activity: Yes    Partners: Male    Birth control/protection: Condom  Other Topics Concern   Not on file  Social History Narrative   Not on file   Social Drivers of Health   Financial Resource Strain: Low Risk  (04/11/2020)   Received from [facility]   Overall Financial  Resource Strain (CARDIA)    Difficulty of Paying Living Expenses: Not hard at all  Food Insecurity: Not on file  Transportation Needs: Not on file  Physical Activity: Not on file  Stress: No Stress Concern Present (04/11/2020)   Received from [facility] - Occupational Stress Questionnaire    Feeling of Stress : Only a little  Social Connections: Unknown (08/12/2021)   Received from [facility]   Social Network    Social Network: Not on file     FAMILY HISTORY:  We obtained a detailed, 4-generation family history.  Significant diagnoses are listed below: Family History  Problem Relation Age of Onset   Arthritis/Rheumatoid Mother    Stomach cancer Mother        metastatic.  genetic testing neg per patient report   Cancer Maternal Aunt        may have had cancer, had a lumpectomy   Cancer Maternal Uncle        non-hod lymphoma   Cancer Paternal Aunt        unknown cancer type   Breast cancer Maternal Grandmother        d.64   Prostate cancer Maternal Grandfather    Colon cancer Maternal Grandfather    Breast cancer Paternal Grandmother        dx. >50   Pancreatic cancer Maternal Cousin        dx.<50. reportedly BRCA2+   Cancer - Colon Maternal Cousin 17   Cancer Other        unknown type of cancer   Cancer Other        unknown type of cancer   Colon cancer Other    Breast cancer Other    Esophageal cancer Neg Hx     Ms. Rosenow mother was diagnosed with stomach cancer at age 61. Ms. Trabucco reports her mom might have had ovarian cancer before the diagnosis of stomach cancer. Her stomach cancer was metastatic to multiple organs when detected and she passed of her cancer. She reportedly received genetic testing and tested negative. Her maternal uncle was diagnosed with non-hodgkins lymphoma Her maternal aunt, Lonell, may have had cancer per patient report. Lonell had a lumpectomy for an unknown reason but may have been cancer related. Jackie's daughter, Annabella, was diagnosed with pancreatic cancer before age 74 and is reportedly BRCA2 positive.  Her maternal grandmother was diagnosed with breast cancer at age 67 Her maternal grandfather was diagnosed with prostate cancer and pancreatic cancer and died of cancer Her maternal grandmother's fraternal twin sister was diagnosed with breast cancer Her maternal great uncle and aunt both had cancer but the type is unknown Her maternal aunt had colon cancer and so did this aunt's son. He was diagnosed at age 60 Her maternal aunt had breast cancer Her paternal aunt had cancer but the type is unknown Her paternal grandmother was diagnosed with breast cancer over the age of 56   Ms. Douse is  unaware of any other of previous family history of genetic testing for hereditary cancer risks other than what is reported above. There is no reported Ashkenazi Jewish ancestry. There is no known consanguinity.  GENETIC COUNSELING ASSESSMENT:  Ms. Kincannon is a 38 y.o. female with a family history of breast, colon, and other cancers. We discussed with Ms. Villafranca that the family history does not meet insurance or NCCN criteria for genetic testing, and, therefore, is not highly consistent with a familial hereditary cancer  syndrome.  We feel she is at low risk to harbor  a gene mutation associated with such a condition. ***Thus, we did not recommend any genetic testing, at this time, and recommended Ms. Deane continue to follow the cancer screening guidelines given by her primary healthcare provider. ***Despite not meeting insurance or NCCN criteria, Ms. Contee decided to pursue genetic testing. We, therefore, discussed the following at today's visit  Discussed mom's presumably neativ result. If her mom was negative, low liklihood forh er to harbor  a gene mutation given this and a lack of cancer family history of her father's side of the family  Discussed tiffanys BRCA2+ Plan was to get POA forms for mom so we could view her result Still offered testing  DISCUSSION: We discussed that, in general, most cancer is not inherited in families, but instead is sporadic or familial. Sporadic cancers occur by chance and typically happen at older ages (>50 years) as this type of cancer is caused by genetic changes acquired during an individual's lifetime. Some families have more cancers than would be expected by chance; however, the ages or types of cancer are not consistent with a known genetic mutation or known genetic mutations have been ruled out. This type of familial cancer is thought to be due to a combination of multiple genetic, environmental, hormonal, and lifestyle factors. While this  combination of factors likely increases the risk of cancer, the exact source of this risk is not currently identifiable or testable. ***  We discussed that *** - ***% of *** is hereditary. Most cases of hereditary *** cancer are associated with *** genes, although there are other genes associated with hereditary *** cancer as well. Cancer risks and management strategies are gene specific. We discussed that genetic testing can beneficial for several reasons, including clarifying specific cancer risks, identifying potential screening and risk-reduction options that may be appropriate, and to understand if other family members could be at risk for cancer and allow them to undergo genetic testing to clarify their cancer risks.   We reviewed the characteristics, features and inheritance patterns of hereditary cancer syndromes. We also discussed genetic testing, including the appropriate family members to test, the process of testing, insurance coverage and turn-around-time for results. We discussed the implications of a negative, positive, carrier and/or variant of uncertain significant result.   Ms. Pellicano  was offered the ***targeted panel (## genes) ***stat panel  Ms. Porchia was offered the ***common hereditary cancer panel *** (40 genes) ***(48 genes).   Ms. Bridgewater was also offered the ***expanded pan-cancer panel ***(77 genes) ***(70 genes).   ***Ms. Backs was informed of the benefits and limitations of each panel, including that expanded pan-cancer panels contain genes may not have clear management guidelines at this point in time. We also discussed that as the number of genes included on a panel increases, the chances of variants of uncertain significance increases. After considering the risks, benefits, and limitations, Ms. Boschee ***did NOT provide***provided informed consent to pursue genetic testing. Ms. Vullo decided to pursue genetic testing for the ***gene panel.     STAT PANEL ***Ms. Faw was informed of the benefits and limitations of each panel, including that expanded pan-cancer panels contain genes may not have clear management guidelines at this point in time. We also discussed that as the number of genes included on a panel increases, the chances of variants of uncertain significance increases. After considering the risks, benefits, and limitations, Ms. @LNAME  provided informed consent to pursue genetic testing. In order  to get genetic test results in a timely manner so that Ms. Stodghill can use these genetic test results for surgical decisions, we recommended Ms. Corliss pursue genetic testing for the *** gene panel, which is a stat panel. This test will run concurrently with the *** gene panel.   ***Based on Ms. Prestwood {Personal/family:20331} history of cancer, she meets medical criteria for genetic testing. she meets criteria due to ***. Despite that she meets criteria, she may still have an out of pocket cost. We discussed that if her out of pocket cost for testing is over $100, the laboratory will call and confirm whether she wants to proceed with testing.  If the out of pocket cost of testing is less than $100 she will be billed by the genetic testing laboratory.    ***Based on Ms. Shed {Personal/family:20331} history of cancer, she meets medical criteria for genetic testing. ***Though Ms. Thaker is not personally affected, there are no affected family members that are willing/able/available to undergo hereditary cancer testing.  Therefore, Ms. Wehrli the most informative family member available.  Despite that she meets criteria, she may still have an out of pocket cost. We discussed that if her out of pocket cost for testing is over $100, the laboratory will call and confirm whether she wants to proceed with testing.  If the out of pocket cost of testing is less than $100 she will be billed by the genetic testing  laboratory.   We discussed that some people do not want to undergo genetic testing due to fear of genetic discrimination.  The Genetic Information Nondiscrimination Act (GINA) was signed into federal law in 2008. GINA prohibits health insurers and most employers from discriminating against individuals based on genetic information (including the results of genetic tests and family history information). According to GINA, health insurance companies cannot consider genetic information to be a preexisting condition, nor can they use it to make decisions regarding coverage or rates. GINA also makes it illegal for most employers to use genetic information in making decisions about hiring, firing, promotion, or terms of employment. It is important to note that GINA does not offer protections for life insurance, disability insurance, or long-term care insurance. GINA does not apply to those in the [employer], those who work for companies with less than 15 employees, and new life insurance or long-term disability insurance policies.  Health status due to a cancer diagnosis is not protected under GINA. More information about GINA can be found by visiting [email or web address].  STAT PANEL ***We reviewed the characteristics, features and inheritance patterns of hereditary cancer syndromes. We also discussed genetic testing, including the appropriate family members to test, the process of testing, insurance coverage and turn-around-time for results. We discussed the implications of a negative, positive and/or variant of uncertain significant result. In order to get genetic test results in a timely manner so that Ms. Cantrall can use these genetic test results for surgical decisions, we recommended Ms. Catarino pursue genetic testing for the ***. Once complete, we recommend Ms. Dauenhauer pursue reflex genetic testing to the *** gene panel.   STATISTICAL MODELING***In order to estimate her chance of having a {CA  GENE:62345} mutation, we used statistical models ({GENMODELS:62370}) that consider her personal medical history, family history and ancestry.  Because each model is different, there can be a lot of variability in the risks they give.  Therefore, these numbers must be considered a rough range and not a precise risk of having a {CA GENE:62345} mutation.  These models estimate that she has approximately a ***-***% chance of having a mutation. Based on this assessment of her family and personal history, genetic testing {IS/ISNOT:34056} recommended.  ***The Tyrer-Cuzick model is one of multiple prediction models developed to estimate an individual's lifetime risk of developing breast cancer. The Tyrer-Cuzick model is endorsed by the [employer] (NCCN). This model includes many risk factors such as family history, endogenous estrogen exposure, and benign breast disease. The calculation is highly-dependent on the accuracy of clinical data provided by the patient and can change over time. The Tyrer-Cuzick model may be repeated to reflect new information in her personal or family history in the future.   ***Based on the patient's {Personal/family:20331} history, a statistical model ({GENMODELS:62370}) was used to estimate her risk of developing {CA HX:54794}. This estimates her lifetime risk of developing {CA HX:54794} to be approximately ***%. This estimation does not consider any genetic testing results.  The patient's lifetime breast cancer risk is a preliminary estimate based on available information using one of several models endorsed by the American Cancer Society (ACS). The ACS recommends consideration of breast MRI screening as an adjunct to mammography for patients at high risk (defined as 20% or greater lifetime risk). Please note that a woman's breast cancer risk changes over time. It may increase or decrease based on age and any changes to the personal and/or family medical history.  The risks and recommendations listed above apply to this patient at this point in time. In the future, she may or may not be eligible for the same medical management strategies and, in some cases, other medical management strategies may become available to her. If she is interested in an updated breast cancer risk assessment at a later date, she can contact us .  ***Ms. Leatherbury has been determined to be at high risk for breast cancer.  her Tyrer-Cuzick risk score is ***%.  For women with a greater than 20% lifetime risk of breast cancer, the [employer] (NCCN) recommends the following:  1.      Clinical encounter every 6-12 months to begin when identified as being at increased risk, but not before age 36  2.      Annual mammograms. Tomosynthesis is recommended starting 10 years earlier than the youngest breast cancer diagnosis in the family or at age 76 (whichever comes first), but not before age 87   73.      Annual breast MRI starting 10 years earlier than the youngest breast cancer diagnosis in the family or at age 11 (whichever comes first), but not before age 19.    PLAN: After considering the risks, benefits, and limitations, Ms. Rothlisberger provided informed consent to pursue genetic testing and the blood sample was sent to {Lab} Laboratories for analysis of the {test}. Results should be available within approximately {TAT TIME} weeks' time, at which point they will be disclosed by telephone to Ms. Fauble, as will any additional recommendations warranted by these results. Ms. Allard will receive a summary of her genetic counseling visit and a copy of her results once available. This information will also be available in Epic.   *** Despite our recommendation, Ms. Samano did not wish to pursue genetic testing at today's visit. We understand this decision and remain available to coordinate genetic testing at any time in the future. We, therefore, recommend Ms.  Suber continue to follow the cancer screening guidelines given by her primary healthcare provider.  ***Based on Ms. Noguera family history,  we recommended her ***, who was diagnosed with *** at age ***, have genetic counseling and testing. Ms. Riedlinger will let us  know if we can be of any assistance in coordinating genetic counseling and/or testing for this family member.   RESOURCES PROVIDED:  Ms. Asencio was provided with the following:  ***[facility] Billing information  ***[facility] Contact card  ***[facility] Hereditary Cancer Testing Patient Guide  Lastly, we encouraged Ms. Shoaf to remain in contact with cancer genetics annually so that we can continuously update the family history and inform her of any changes in cancer genetics and testing that may be of benefit for this family.   Ms. Faidley questions were answered to her satisfaction today. Our contact information was provided should additional questions or concerns arise. Thank you for the referral and allowing us  to share in the care of your patient.   Ron Junco R. Bluford, MS, [facility] Certified [email or web address] phone: [phone]  I personally spent a total of *** minutes in the care of the patient today including {Time Based Coding:210964241}. *** The patient was seen alone.  ***The patient brought ***. Drs. Lanny Stalls, and/or Gudena were available for questions, if needed.   _______________________________________________________________________ For Office Staff:  Number of people involved in session: *** Was an Intern/ student involved with case: {YES/NO:63}

## 2024-02-06 NOTE — Telephone Encounter (Signed)
 Kim Walsh called to state that she still has not received Skyriz medication from [facility]. States that she called them earlier today and was told that the medication prescription was there but had not been approved by insurance.   I contacted [facility] at phone [phone] and spoke to Clearance A. who states that the prescription is ready and has been approved by insurance. He states that they did speak with patient earlier and was trying to let her know that they cannot take the Skyrizi  copay assistance program card but she would instead need to enroll with her insurance companies copay assistance enrollment before they would fill the prescription.  I have spoken to patient to advise of this information. She states that she did receive a phone call recently about enrolling in insurance assistance program but at the time had no idea this was a requirement since she already had assistance through Skyrizi . She plans to enroll in her insurance plans program and then call [facility] back to send the medication.   Patient will call back and let us  know if this is successful.

## 2024-02-17 ENCOUNTER — Other Ambulatory Visit: Payer: Self-pay

## 2024-02-17 NOTE — Progress Notes (Signed)
 Patient receiving from [facility], disenrolled.

## 2024-03-05 ENCOUNTER — Other Ambulatory Visit (HOSPITAL_COMMUNITY): Payer: Self-pay

## 2024-03-07 ENCOUNTER — Other Ambulatory Visit (HOSPITAL_COMMUNITY): Payer: Self-pay

## 2024-03-08 ENCOUNTER — Telehealth: Payer: Self-pay | Admitting: Internal Medicine

## 2024-03-08 NOTE — Telephone Encounter (Signed)
 Inbound call from [facility] stating that they faxed over a Prior Auth form for this patient. [facility] stated that if we wish to give verbal consent please feel free to contact [phone] with authorization number [id number]. Please advise.

## 2024-03-08 NOTE — Telephone Encounter (Signed)
 Authorization is located in the media tab

## 2024-03-12 ENCOUNTER — Other Ambulatory Visit (HOSPITAL_COMMUNITY): Payer: Self-pay

## 2024-03-13 ENCOUNTER — Telehealth: Payer: Self-pay

## 2024-03-13 NOTE — Telephone Encounter (Signed)
 Pharmacy Patient Advocate Encounter   Received notification from Onbase that prior authorization for Skyrizi  360MG /2.4ML (150MG /ML) single-dose prefilled cartridge with on-body injector  is required/requested.   Insurance verification completed.   The patient is insured through [employer].   Per test claim: PA required; PA submitted to above mentioned insurance via Fax Key/confirmation #/EOC [id number] Status is pending

## 2024-03-16 ENCOUNTER — Other Ambulatory Visit: Payer: Self-pay | Admitting: Nurse Practitioner

## 2024-03-16 DIAGNOSIS — N6310 Unspecified lump in the right breast, unspecified quadrant: Secondary | ICD-10-CM

## 2024-03-16 DIAGNOSIS — N6320 Unspecified lump in the left breast, unspecified quadrant: Secondary | ICD-10-CM

## 2024-03-23 ENCOUNTER — Inpatient Hospital Stay
Admission: RE | Admit: 2024-03-23 | Discharge: 2024-03-23 | Attending: Nurse Practitioner | Admitting: Nurse Practitioner

## 2024-03-23 ENCOUNTER — Other Ambulatory Visit

## 2024-03-23 DIAGNOSIS — N6310 Unspecified lump in the right breast, unspecified quadrant: Secondary | ICD-10-CM

## 2024-04-16 ENCOUNTER — Other Ambulatory Visit (HOSPITAL_COMMUNITY): Payer: Self-pay

## 2024-04-16 NOTE — Telephone Encounter (Signed)
 Pharmacy Patient Advocate Encounter  Received notification from [facility] that Prior Authorization for Skyrizi  360MG /2.4ML (150MG /ML) single-dose prefilled cartridge with on-body injector  has been CANCELLED due to patient no longer covered by plan   PA #/Case ID/Reference #: [id number]

## 2024-05-04 ENCOUNTER — Other Ambulatory Visit: Payer: Self-pay | Admitting: Internal Medicine

## 2024-05-04 DIAGNOSIS — R112 Nausea with vomiting, unspecified: Secondary | ICD-10-CM
